# Patient Record
Sex: Female | Born: 1965 | Race: Black or African American | Hispanic: No | Marital: Single | Smoking: Never smoker
Health system: Southern US, Community
[De-identification: ages and names within clinical notes are randomized; demographics above are authoritative.]

## PROBLEM LIST (undated history)

## (undated) DIAGNOSIS — I10 Essential (primary) hypertension: Secondary | ICD-10-CM

## (undated) DIAGNOSIS — E785 Hyperlipidemia, unspecified: Secondary | ICD-10-CM

## (undated) DIAGNOSIS — E1129 Type 2 diabetes mellitus with other diabetic kidney complication: Secondary | ICD-10-CM

## (undated) DIAGNOSIS — E1165 Type 2 diabetes mellitus with hyperglycemia: Principal | ICD-10-CM

## (undated) HISTORY — DX: Essential (primary) hypertension: I10

## (undated) HISTORY — PX: TONSILLECTOMY: SUR1361

## (undated) HISTORY — DX: Morbid (severe) obesity due to excess calories: E66.01

## (undated) HISTORY — DX: Type 2 diabetes mellitus with other diabetic kidney complication: E11.29

## (undated) HISTORY — DX: Hyperlipidemia, unspecified: E78.5

## (undated) HISTORY — PX: ABDOMINAL HYSTERECTOMY: SHX81

## (undated) HISTORY — DX: Type 2 diabetes mellitus with hyperglycemia: E11.65

---

## 1999-01-28 ENCOUNTER — Other Ambulatory Visit: Admission: RE | Admit: 1999-01-28 | Discharge: 1999-01-28 | Payer: Self-pay | Admitting: Obstetrics and Gynecology

## 2000-03-23 ENCOUNTER — Emergency Department (HOSPITAL_COMMUNITY): Admission: EM | Admit: 2000-03-23 | Discharge: 2000-03-24 | Payer: Self-pay | Admitting: Emergency Medicine

## 2000-04-06 ENCOUNTER — Emergency Department (HOSPITAL_COMMUNITY): Admission: EM | Admit: 2000-04-06 | Discharge: 2000-04-06 | Payer: Self-pay | Admitting: Emergency Medicine

## 2000-04-06 ENCOUNTER — Encounter: Payer: Self-pay | Admitting: Emergency Medicine

## 2001-10-10 ENCOUNTER — Emergency Department (HOSPITAL_COMMUNITY): Admission: EM | Admit: 2001-10-10 | Discharge: 2001-10-10 | Payer: Self-pay | Admitting: Emergency Medicine

## 2002-10-16 ENCOUNTER — Encounter: Payer: Self-pay | Admitting: Emergency Medicine

## 2002-10-16 ENCOUNTER — Emergency Department (HOSPITAL_COMMUNITY): Admission: EM | Admit: 2002-10-16 | Discharge: 2002-10-16 | Payer: Self-pay | Admitting: Emergency Medicine

## 2003-12-28 ENCOUNTER — Encounter: Admission: RE | Admit: 2003-12-28 | Discharge: 2003-12-28 | Payer: Self-pay | Admitting: Internal Medicine

## 2004-01-12 ENCOUNTER — Ambulatory Visit (HOSPITAL_COMMUNITY): Admission: RE | Admit: 2004-01-12 | Discharge: 2004-01-12 | Payer: Self-pay | Admitting: General Surgery

## 2004-01-12 ENCOUNTER — Encounter (INDEPENDENT_AMBULATORY_CARE_PROVIDER_SITE_OTHER): Payer: Self-pay | Admitting: *Deleted

## 2007-04-08 ENCOUNTER — Encounter: Admission: RE | Admit: 2007-04-08 | Discharge: 2007-04-08 | Payer: Self-pay | Admitting: Internal Medicine

## 2007-04-20 ENCOUNTER — Encounter: Admission: RE | Admit: 2007-04-20 | Discharge: 2007-04-20 | Payer: Self-pay | Admitting: Internal Medicine

## 2007-04-24 ENCOUNTER — Encounter: Admission: RE | Admit: 2007-04-24 | Discharge: 2007-04-24 | Payer: Self-pay | Admitting: Internal Medicine

## 2007-11-04 ENCOUNTER — Encounter (INDEPENDENT_AMBULATORY_CARE_PROVIDER_SITE_OTHER): Payer: Self-pay | Admitting: Obstetrics and Gynecology

## 2007-11-04 ENCOUNTER — Inpatient Hospital Stay (HOSPITAL_COMMUNITY): Admission: RE | Admit: 2007-11-04 | Discharge: 2007-11-07 | Payer: Self-pay | Admitting: Obstetrics and Gynecology

## 2007-11-15 ENCOUNTER — Inpatient Hospital Stay (HOSPITAL_COMMUNITY): Admission: AD | Admit: 2007-11-15 | Discharge: 2007-11-15 | Payer: Self-pay | Admitting: Obstetrics and Gynecology

## 2007-11-15 ENCOUNTER — Inpatient Hospital Stay (HOSPITAL_COMMUNITY): Admission: AD | Admit: 2007-11-15 | Discharge: 2007-11-16 | Payer: Self-pay | Admitting: Obstetrics and Gynecology

## 2007-11-19 ENCOUNTER — Ambulatory Visit (HOSPITAL_COMMUNITY): Admission: RE | Admit: 2007-11-19 | Discharge: 2007-11-19 | Payer: Self-pay | Admitting: Obstetrics and Gynecology

## 2007-11-28 ENCOUNTER — Inpatient Hospital Stay (HOSPITAL_COMMUNITY): Admission: AD | Admit: 2007-11-28 | Discharge: 2007-11-28 | Payer: Self-pay | Admitting: Obstetrics and Gynecology

## 2007-11-29 ENCOUNTER — Inpatient Hospital Stay (HOSPITAL_COMMUNITY): Admission: AD | Admit: 2007-11-29 | Discharge: 2007-11-29 | Payer: Self-pay | Admitting: Obstetrics and Gynecology

## 2007-12-04 ENCOUNTER — Inpatient Hospital Stay (HOSPITAL_COMMUNITY): Admission: AD | Admit: 2007-12-04 | Discharge: 2007-12-04 | Payer: Self-pay | Admitting: Obstetrics and Gynecology

## 2008-12-26 ENCOUNTER — Encounter: Admission: RE | Admit: 2008-12-26 | Discharge: 2008-12-26 | Payer: Self-pay | Admitting: Cardiology

## 2008-12-27 ENCOUNTER — Inpatient Hospital Stay (HOSPITAL_BASED_OUTPATIENT_CLINIC_OR_DEPARTMENT_OTHER): Admission: RE | Admit: 2008-12-27 | Discharge: 2008-12-27 | Payer: Self-pay | Admitting: Cardiology

## 2010-09-09 LAB — POCT I-STAT GLUCOSE: Operator id: 194801

## 2010-10-16 NOTE — H&P (Signed)
[name], Stokes               ACCOUNT NO.:  [id number]   MEDICAL RECORD NO.:  [id number]         PATIENT TYPE:  CARD   LOCATION:                                 FACILITY:   PHYSICIAN:  Colleen Can. Deborah Chalk, M.D.DATE OF BIRTH:  1966-03-09   DATE OF ADMISSION:  12/27/2008  DATE OF DISCHARGE:                              HISTORY & PHYSICAL   CHIEF COMPLAINT:  Chest pain.   HISTORY OF PRESENT ILLNESS:  Jenna Stokes is a 45 year old morbidly obese  black female who presents for cardiac catheterization.  She was referred  to our office for stress testing due to recurrent episodes of chest  pain.  She has multiple cardiovascular risk factors.  She underwent  stress testing on December 16, 2008.  She was able to exercise on the  standard Bruce protocol for a total of 3-1/2 minutes.  She had very poor  exercise tolerance.  She was subsequently switched over to a Lexiscan  protocol.  Her findings were consistent with inferior ischemia, although  her morbid obesity limits the sensitivity and specificity of the test.  She is now referred for cardiac catheterization.  Clinically, she has  had complaints of chest pain with exertion.   PAST MEDICAL HISTORY:  1. Diabetes since June 2009.  2. Morbid obesity.  3. Hypertension.  4. History of hysterectomy in June 2009, with subsequent wound      dehiscence.  5. Remote tonsillectomy.  6. Childbirth x1.  7. Hyperlipidemia.  8. Positive family history for coronary disease.   ALLERGIES:  PENICILLIN.   CURRENT MEDICATIONS:  1. Benicar/hydrochlorothiazide 40/25 daily.  2. Metformin 500 mg a day.  3. Multivitamin daily.   FAMILY HISTORY:  Her father died at age 66 of unknown reasons.  Her  mother is alive at age 3 and is currently on dialysis.  She has had  previous heart attack and had stent placement x2.   SOCIAL HISTORY:  She is single.  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  She does not smoke and has no alcohol use.  She really does not  exercise  on a routine basis.   REVIEW OF SYSTEMS:  She has had episodes of chest pain with exertion.  She is short of breath with walking.  She has really had no complaints  of edema.  She notes that she was very short of breath about a year ago,  but that has now improved.  She is currently at her highest weight and  has been morbidly obese for quite some time.  She has had no recent  fever, flu or cough.  She is not lightheaded or dizzy.  She has had no  abdominal pain, constipation or diarrhea.  All other review of systems  are negative.   PHYSICAL EXAMINATION:  GENERAL:  She is a morbidly obese black female.  She is pleasant.  She is in no acute distress.  VITAL SIGNS:  Her weight is 347.8 pounds and she is 5 feet 9.  Her blood  pressure is 150/96 sitting, 150/90 standing, heart rate is 80 and  regular,  respirations 18.  She is afebrile.  SKIN:  Warm and dry.  Color is unremarkable.  HEENT:  Normocephalic, atraumatic.  Pupils are equal and reactive.  Sclerae is nonicteric.  NECK:  Supple, thick and without masses.  LUNGS:  Show decreased breath sounds bilaterally.  CARDIAC:  Shows the heart tones to be distant.  ABDOMEN:  Morbidly obese.  EXTREMITIES:  Full, but no significant edema.  MUSCULOSKELETAL:  Shows gait and range of motion to be intact.  Strength  is symmetric.  NEUROLOGIC:  Shows no gross focal deficits.   Pertinent labs are pending.   OVERALL IMPRESSION:  1. Abnormal stress test.  2. Chest pain.  3. Hypertension.  4. Diabetes.  5. Morbid obesity.   PLAN:  We will proceed on with cardiac catheterization.  The risks of  the procedure and benefits have all been explained and she is willing to  proceed on Tuesday, December 27, 2008.      Jenna Stokes, N.P.      Colleen Can. Deborah Chalk, M.D.  Electronically Signed    LC/MEDQ  D:  12/26/2008  T:  12/26/2008  Job:  [id number]

## 2010-10-16 NOTE — Cardiovascular Report (Signed)
NAMEMEMORI, SAMMON               ACCOUNT NO.:  [id number]   MEDICAL RECORD NO.:  [id number]          PATIENT TYPE:  OIB   LOCATION:  1965                         FACILITY:  [facility]   PHYSICIAN:  Colleen Can. Deborah Chalk, M.D.DATE OF BIRTH:  09-Sep-1965   DATE OF PROCEDURE:  DATE OF DISCHARGE:  12/27/2008                            CARDIAC CATHETERIZATION   Ms. Fittro presents with atypical chest pain, but had an abnormal stress  Cardiolite study.  She is morbidly obese.   She does have diabetes and hypertension, however.   TYPE AND SITE OF ENTRY:  Percutaneous right femoral artery.   CATHETERS:  A 4-French JL-4 left coronary cath, 3-DRC 4-French right  coronary catheter, 4-French pigtail ventriculographic catheter.   MEDICATIONS GIVEN PRIOR TO PROCEDURE:  Valium 10 mg p.o.   MEDICATIONS GIVEN DURING THE PROCEDURE:  Versed 5 mg IV, fentanyl 50 mcg  IV, and labetalol 40 mg IV.   CONTRAST MATERIAL:  Omnipaque.   COMMENTS:  The patient tolerated the procedure well.   HEMODYNAMIC DATA:  The aortic pressure is 193/93, LV pressure 194/60-29.   ANGIOGRAPHIC DATA:  The left ventricular angiogram was performed in the  RAO position.  Overall cardiac size and silhouette were normal.  The  global ejection fraction was 60%.  Regional wall motion was normal.   Coronary arteries:  The coronary arteries arise and distribute normally.  1. Left main coronary artery is normal.  2. Left anterior descending has a diagonal vessel and then the second      diagonal vessel.  It is the largest branch.  The left anterior      descending is normal.  3. Left circumflex has a more proximal obtuse marginal and then a      bifurcating distal obtuse marginal.  It is normal.  4. Right coronary artery is a small, but dominant vessel.  It is      normal.   OVERALL IMPRESSION:  1. Normal left ventricular function.  2. Normal coronary arteries.  3. Hypertension with hypertensive heart disease.   PLAN:   Management of Ms. Granados will need to be with weight reduction  and management of hypertension and diabetes.      Colleen Can. Deborah Chalk, M.D.  Electronically Signed     SNT/MEDQ  D:  12/27/2008  T:  12/28/2008  Job:  [id number]   cc:   Juline Patch, M.D.

## 2010-10-16 NOTE — Op Note (Signed)
NAMEBRYCE, Jenna Stokes               ACCOUNT NO.:  [id number]   MEDICAL RECORD NO.:  [id number]          PATIENT TYPE:  AMB   LOCATION:  [facility]                           FACILITY:  [facility]   PHYSICIAN:  Dois Davenport A. Rivard, M.D. DATE OF BIRTH:  1966/02/23   DATE OF PROCEDURE:  DATE OF DISCHARGE:                               OPERATIVE REPORT   PREOPERATIVE DIAGNOSIS:  Symptomatic uterine fibroids with menorrhagia  and anemia as well as morbid obesity.   POSTOPERATIVE DIAGNOSIS:  Symptomatic uterine fibroids with menorrhagia  and anemia as well as morbid obesity, new diagnosis of type 2 diabetes.   ANESTHESIA:  General, Raul Del, M.D.   PROCEDURE:  Total abdominal hysterectomy.   SURGEON:  Crist Fat. Rivard, M.D.   ASSISTANTMarquis Lunch. Adline Peals., physician's assistant.   ESTIMATED BLOOD LOSS:  300 mL.   PROCEDURE:  After being informed of the planned procedure with possible  complications including bleeding, infection, injury to bowels, bladder  or ureters, informed consent is obtained.  The patient is taken to OR  #3, given general anesthesia with endotracheal intubation without any  complication.  She is placed in the dorsal decubitus position with knee-  high sequential compression devices and arms onto support boards.  Her  abdomen is taped to a retraction device to help move the pannus up.  She  is then prepped and draped in a sterile fashion and a Foley catheter is  inserted in her bladder.  We proceeded with a midline incision after  infiltrating this area with 20 mL of Marcaine 0.25 and we bring this  incision down sharply to the fascia.  The fascia is incised in the  midline fascia and linea alba is dissected.  Peritoneum is entered  bluntly.  Bookwalter retractors are then placed strategically to hold  the weight of the abdominal wall and to retract on each side.  Bowels  are then packed with moist lap.  Observation:  The uterus is greatly  enlarged about 20 weeks in  size, globulus with no abdominal fibroid  identified.  Both ovaries and both tubes are normal.  Omentum is normal.  Cul-de-sac is normal.  Both the anterior and posterior cul-de-sac are  normal.  The uterus was then delivered through the incision and a  corkscrew, uterine handle was placed at the fundus.   The right round ligament is then grasped, sutured with a transfix suture  of 0-Vicryl, sectioned, and tagged for future use.  The utero-ovarian  ligament is then isolated with the Rogers forceps, sectioned, and  sutured with a transfix suture of 0-Vicryl.  The broad ligament is  entered somewhat, but is adherent on the side and we decided to proceed  with the left side and have different approach. The left round ligament  is grasped, sutured, with a transfix suture of 0-Vicryl, held for future  use with a tag.  The left utero-ovarian ligament with tube are grasped  with Roger forceps, sectioned, and sutured with a transfix suture of 0-  Vicryl.  The broad ligament is then easily separated in its anterior  and  posterior sheath and we can open the round, broad ligament anteriorly  all the way to the right side in order to safely retract bladder  bluntly.  The posterior leaf of the broad ligament on that side was then  sectioned down to isolate uterine vessels.  Those are then clamped with  a Rogers clamp sectioned and sutured with a 0-Vicryl.  We then double  sutured this uterine artery with another 0-Vicryl.  We proceeded on the  right side with now opening the posterior sheath of the broad ligament  skeletonizing the uterine vessels and isolating them with Rogers  forceps, sectioning them and suturing them with this 0-Vicryl suture.  A  second suture is placed safely anteriorly of 0-Vicryl.   The cardinal ligament on each side is then clamped with a straight  Rogers sectioned and sutured with a transfix suture of 0-Vicryl and this  was done in the same fashion on the left side, we are  now able to do a  second bite on the cardinal ligament with a straight Rogers clamp,  sectioned that pedicle and sutured it with a transfix suture of 0-  Vicryl.  Because of the depth of the patient, we then amputated the body  of the uterus from the cervical stump and removed it completely, now we  have a better view and a better access to the uterosacral ligaments,  which are isolated with curved Rogers forceps, sectioned, and sutured  with a transfix suture held for future suspension.  Bladder is well away  from our vaginal fornix and we can now isolate the vaginal angles on  each side with the curved Rogers clamp.  We section those vaginal angles  and suture them with 0-Vicryl held for suspension.  The vagina was then  entered with a Satinsky scissors and the cervical stump is removed  entirely.  Vaginal angle sutures are then placed on each side with 0-  Vicryl and sutured to the corresponding uterosacral ligaments for  suspension.  The vault is then closed with figure-of-eight stitches of 0-  Vicryl.   On reviewing all pedicles, bleeding at the right utero-ovarian ligament  is noted and this ligament is grasped and then sutured with a 0-Vicryl.  Hemostasis on the vaginal vault appears satisfactory.  We then irrigated  profusely with warm saline and notice satisfactory hemostasis on all  pedicles.  The ureters are re-verified, are away from our suture line,  show normal peristalsis and bilaterally and have no dilatation.  We  again irrigated profusely with warm saline and noticed satisfactory  hemostasis.  Because we had to partially dissected the left pelvic wall  to visualize the ureter, a small piece of Gelfoam is left in place.  All  the packing and retractors were then removed.   Under fascia, hemostasis is completed with cautery and the fascia is  closed with Smead  Jones sutures of 0-Vicryl.  The wound is irrigated  with warm saline.  Hemostasis is completed with cautery.   The adipose  tissue is reapproximated with simple sutures of 0-plain and the skin is  closed with staples and 1/4 Steri-Strips.  Before we do that, a JP drain  is left in the incision suprafascially through a right counter incision  held in place with a 0-silk suture.   Instruments and sponge count is complete x2.  Estimated blood loss is  300 mL.  The procedure was very well tolerated by the patient who is  taken to  recovery room in a well and stable condition.   SPECIMEN:  Uterus and cervix sent to pathology, weighing 1606 grams.      Crist Fat Rivard, M.D.  Electronically Signed     SAR/MEDQ  D:  11/04/2007  T:  11/05/2007  Job:  [id number]   cc:   Merlene Laughter. Renae Gloss, M.D.  Fax: [phone]

## 2010-10-16 NOTE — H&P (Signed)
NAMETOPANGA, ALVELO               ACCOUNT NO.:  [id number]   MEDICAL RECORD NO.:  [id number]          PATIENT TYPE:  AMB   LOCATION:                                FACILITY:  [facility]   PHYSICIAN:  Crist Fat. Rivard, M.D. DATE OF BIRTH:  29-Jun-1965   DATE OF ADMISSION:  11/19/2007  DATE OF DISCHARGE:                              HISTORY & PHYSICAL   Ms. Heaphy is a 45 year old single African American female para 1-0-2-1  who presents today on the day of admission for closure of abdominal  incision secondary to wound dehiscence.  The patient had a total  abdominal hysterectomy which was performed by Dr. Estanislado Pandy on June 3.  She  subsequently following discharge and postop course at home represented  first on June 14 at maternity admission unit at [facility] with a  complaint of some increase in drainage and sensation of feeling like the  wound was starting to open up.  She then represented later on toward the  end of the day on the 14th and 15th,  and after going home had felt as  she sat down at home the dehiscence had occurred with further extension.  She has been followed in the office daily, where she has been having  wound dressing changes.  Both Iodoform has been used as well as just  saline wet-to-dry gauze dressings to pack the wound.  She has been  encouraged at every visit to comply with wearing an abdominal binder to  give more support to the area, and after having it placed on yesterday  in the office continued to remove it and it was in her bag before she  left.  She presents today in the hospital saying that she is not sure  where it is, it  could be in her car, and continues to be noncompliant  with that instruction.  The patient was recently diagnosed with type 2  adult onset diabetes, which was diagnosed on June 3 on the date of her  surgery for elevated fasting blood sugar.  Other history:  She was  scheduled for the total abdominal hysterectomy because of symptomatic  uterine fibroids and menorrhagia.  The patient had reported since  menarche at age 82 having heavy periods.  Over the past 5 years,  however, her symptoms have worsened in regards to menstrual flow.  She  reports flow between 5 to 7 days, during which time she changes between  overnight pads and super-plus tampons approximately 8 times a day and in  spite of that still has soiling of clothes.  Further reported menstrual  cramps an 8/10 but did have some relief with Extra Strength Tylenol.  She had denied any intermenstrual bleeding, dyspareunia, vaginitis,  fever, nausea, vomiting, diarrhea or dysuria.  Complained of occasional  nocturia.  Did have a hemoglobin of 7.2 in November 2008 as a result of  heavy bleeding, however, following daily iron therapy increased to 13.7  by  February 2009.  Pelvic ultrasound November 2008 showed uterus  measuring 22.13 cm x 15.77 cm x 12.7 cm and a midline fibroid  measuring  15.3 x 11.7 x 14 cm.  Both her ovaries were within normal limits on that  study.  The patient was placed on Lupron and Depo 11.25 mg in December  2008.  Followup ultrasound in May 2009 showed a uterus measuring 9.14 x  10.2 cm with a fibroid measuring 16.2 x 11.0 cm.  The patient's ovaries  were again normal on that study.  Most recently the patient did have  labs drawn June 17, yesterday.  White count was slightly elevated at  13.3, hemoglobin was 12.8, hematocrit 38.1, platelets were 353.  Her  potassium was slightly low, equal to 3, and had an elevated glucose  equal to 118.  Today she presents with denying any cough, shortness of  breath, sore throat, ear pain or headache, without nausea, vomiting or  diarrhea.  No UTI signs or symptoms.  She denies any extremity edema.  She did say that she had a change of dressing on her wound this morning  after it was packed and dressed yesterday afternoon at the office by  Karin Lieu, Certified Nurse Midwife/FNP.  She is without any other   complaints.  Did have concerns and voiced question if she needed to have  Tamiflu in light of recent H1N1 confirmation at [facility].  She also did have questions which I had deferred to anesthesia and Dr.  Estanislado Pandy regarding small turnaround to go back under general anesthesia.  The patient is having little pain.  She reports that the area of  incision is still very numb, occasionally a tingling sensation, but has  not required any recent pain medications.   OBSTETRICAL HISTORY:  The patient is a gravida 3, para 1-0-2-1.  She did  have one C-section.   GYNECOLOGIC HISTORY:  Menarche at 45 years of age.  Last menstrual  period was December 2008 secondary to Lupron administration.  Denies any  history of sexually transmitted diseases or abnormal Pap smear.  Last  normal Pap smear was October 2008.   MEDICAL HISTORY:  1. Recent diagnosis of type 2 diabetes on November 04, 2007.  2. Hypertension.  3. History of anemia.  4. Migraines.  5. Arm fracture.  6. Morbid obesity.   SURGICAL HISTORY:  Includes tonsillectomy, total abdominal hysterectomy,  left breast lipoma excision.  Denies any history of blood transfusions.   FAMILY HISTORY:  Significant for hypertension, diabetes and migraines.   HABITS:  The patient does not use tobacco.  Consumes alcohol on special  occasions.  Denies illicit drug use.   CURRENT MEDICATIONS:  Benicar/HCT 40/25 one tablet p.o. daily as well as  metformin 500 mg p.o. daily.   ALLERGIES:  SHE HAS A PENICILLIN ALLERGY, WHICH CAUSES HIVES.   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.   REVIEW OF SYSTEMS:  Reports occasional nocturia twice a night,  occasional constipation.  Please see history of present illness for any  other complaints but review of systems overall negative.   PHYSICAL EXAMINATION:  VITAL SIGNS:  The patient's most recent weight  336 pounds, height is 5 feet 10 1/2  inches.  BMI is somewhere around 49.  GENERAL:  No acute distress.  She is alert and oriented x3, slightly  nervous.  NECK:  Supple without masses.  No thyromegaly or cervical adenopathy.  HEART:  Regular rate and rhythm.  LUNGS:  Clear.  BACK:  No CVA tenderness.  ABDOMEN:  Without tenderness,  masses or organomegaly.  She did have a  dressing on pannus of abdomen that had a completely clean  dressing.  There was no drainage noted.  No pain or tenderness on palpation.  Original length of vertical dehiscence began around 13 cm at last note  by Dr. Estanislado Pandy, as long as 22 cm now, which does not include underlying  fascia.  EXTREMITIES:  Without clubbing, cyanosis or edema.  PELVIC:  Exam deferred today.   IMPRESSION:  1. Wound dehiscence, status post total abdominal hysterectomy.  2. Recent type 2 diabetes diagnosis on June 3 as well.  3. Morbidly obese.  4. Slightly elevated white count equal to 13.3 with probable      relationship to healing process and dehiscence itself.   PLAN:  Discussion with Dr. Estanislado Pandy of risks, benefits and alternatives of  closing wound dehiscence versus continued packing, and the patient  desires to proceed with return to the OR to attempt to close dehiscence  of vertical incision.  Questions were deferred to anesthesia and Dr.  Estanislado Pandy prior to surgery, and the patient is currently being prepped for  the procedure.      Candice Earling, CNM      Dois Davenport A. Rivard, M.D.  Electronically Signed    CHS/MEDQ  D:  11/19/2007  T:  11/19/2007  Job:  [id number]

## 2010-10-16 NOTE — H&P (Signed)
NAMEDESSA, LEDEE               ACCOUNT NO.:  [id number]   MEDICAL RECORD NO.:  [id number]          PATIENT TYPE:  AMB   LOCATION:  SDC                           [facility]ILITY:  [facility]   PHYSICIAN:  Dois Davenport A. Rivard, M.D. DATE OF BIRTH:  11-07-1965   DATE OF ADMISSION:  DATE OF DISCHARGE:                              HISTORY & PHYSICAL   Ms. Safley is a 45 year old single African-American female, para 1-0-2-1  who presents for total abdominal hysterectomy because of symptomatic  uterine fibroids and menorrhagia.  The patient reports since menarche at  age 40, she has had heavy periods.  Over the past 5 years, however,  her symptoms have worsened in regards to her menstrual flow.  The  patient will flow between 5-7 days during which time she changes in  overnight pads with a Super Plus tampon approximately 8 times daily.  And in spite of this, will occasionally soil her clothes.  She further  reports having menstrual cramps which she rates as 8/10 on 10-point pain  scale, however, will get moderate relief (4/10 on a 10-point pain scale)  with Extra Strength Tylenol.  She denies any intermenstrual bleeding,  dyspareunia, vaginitis symptoms, fever, nausea, vomiting, diarrhea, or  dysuria.  She does admit, however, to some occasional nocturia.  In  November 2008, the patient had a hemoglobin of 7.2 as a result of her  heavy bleeding, however, with daily iron therapy it increased to 13.7 by  February 2009.  Pelvic ultrasound in November 2008 showed a uterus  measuring 22.13 cm x 15.77 cm x 12.7 cm and a midline fibroid measuring  15.3 x 11.7 x 14 cm.  Both ovaries appeared normal on this study.  The  patient was placed on Lupron and Depo 11.25 mg in December 2008.  A  followup ultrasound in May 2009 showed a uterus measuring 9.14 x 10.2 cm  with a fibroid measuring 16.2 x 11.0 cm.  Again, the patient's ovaries  appeared normal on this study.  The patient had extensive review of both  medical  and surgical management options for her symptoms to include  expected management, hormonal therapy, uterine artery embolization,  myomectomy, and hysterectomy.  After careful consideration, however, the  patient has concluded that she wishes to proceed with definitive therapy  in the form of hysterectomy.   OB HISTORY:  Gravida 3, para 1-0-2-1.  The patient underwent cesarean  section.   GYN HISTORY:  Menarche 45 years old.  Last menstrual period was December  2008 due to being on Lupron.  She denies any history of sexually  transmitted diseases or abnormal Pap smears.  Her last normal Pap smear  was October 2008.   MEDICAL HISTORY:  1. Hypertension.  2. Anemia.  3. Migraines.  4. Arm fracture.  5. Morbid obesity   SURGICAL HISTORY:  The patient has had a tonsillectomy and a left breast  lipoma excision.  Denies any history of blood transfusion.   FAMILY HISTORY:  Hypertension, diabetes, and migraines.   HABITS:  The patient does not use tobacco.  She consumes alcohol only on  special occasions.  Denies any illicit drug use.   CURRENT MEDICATIONS:  1. Azor 10/20 one tablet daily.  2. Hydrochlorothiazide 25 mg daily.   ALLERGIES:  The patient is allergic to PENICILLIN which causes hives.   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   REVIEW OF SYSTEMS:  The patient occasionally has nocturia (twice  nightly), occasional constipation, but denies any chest pain, shortness  of breath, headache, fever, dizziness, nausea, vomiting, diarrhea, and  except as is mentioned in history of present illness.  The patient's  review of systems is negative.   PHYSICAL EXAMINATION:  VITAL SIGNS:  Blood pressure 130/82, weight is  346 pounds, height is 5 feet 10-1/2 inches tall,BMI 49.7, pulse is 78.  NECK:  Supple without masses.  There is no thyromegaly or cervical  adenopathy.  HEART:  Regular rate and rhythm.  LUNGS:   Clear.  BACK:  No CVA tenderness.  ABDOMEN:  Without tenderness, masses, or organomegaly.  EXTREMITIES:  Without clubbing, cyanosis, or edema.  PELVIC:  EG/BUS is within normal limits.  Vagina is rugous.  Cervix is  nontender without lesions.  Uterus appears approximately 14-16 weeks  size, however, exam is severely limited due to body habitus.  There is  no tenderness.  Adnexa without tenderness or masses.   IMPRESSIONS:  1. Symptomatic uterine fibroids.  2. Menorrhagia.  3. History of anemia (hemoglobin as low as 7.2).  4. Morbid obesity.   DISPOSITION:  A discussion was held with the patient regarding  indications for her procedure along with its risks which include but are  not limited to reaction to anesthesia, damage to adjacent organs,  infection, and excessive bleeding.  The patient has consented to proceed  with a total abdominal hysterectomy at [facility] [place] on  November 04, 2007 at 7:30 a.m.      Elmira J. Adline Peals.      Crist Fat Rivard, M.D.  Electronically Signed    EJP/MEDQ  D:  11/01/2007  T:  11/02/2007  Job:  [id number]

## 2010-10-16 NOTE — Op Note (Signed)
Jenna Stokes, Jenna Stokes               ACCOUNT NO.:  [id number]   MEDICAL RECORD NO.:  [id number]          PATIENT TYPE:  AMB   LOCATION:  SDC                           [facility]   PHYSICIAN:  Dois Davenport A. Rivard, M.D. DATE OF BIRTH:  05/18/1966   DATE OF PROCEDURE:  11/19/2007  DATE OF DISCHARGE:                               OPERATIVE REPORT   PREOPERATIVE DIAGNOSIS:  Morbid obesity status post abdominal  hysterectomy, wound dehiscence.   POSTOPERATIVE DIAGNOSIS:  Morbid obesity status post abdominal  hysterectomy, wound dehiscence.   ANESTHESIA:  General by Dr. Tacy Dura.   PROCEDURE:  Secondary closure of wound with placement of retention  sutures.   SURGEON:  Crist Fat. Rivard, MD   ASSISTANT:  Museum/gallery exhibitions officer, CNM   ESTIMATED BLOOD LOSS:  Minimal.   PROCEDURE IN DETAIL:  After being informed of the planned procedure with  possible complications including bleeding and infection as well as  recurrent wound dehiscence, informed consent was obtained.  The patient  was taken to OR #3 and given general anesthesia with laryngeal mask  without complication.  She was placed in the dorsal decubitus position,  already lying on an abdominal binder fitted for her size.  She was  prepped and draped in a sterile fashion and the packing previously  placed in the incision was removed during that process.  She is now  draped in a sterile fashion.   The incision was opened on a length of 15 cm with clean edges.  No  evidence of infection and the fatty tissue underneath the incision was  about 18 cm deep, also healthy with good granulation tissue.  The  incision was profusely irrigated with normal saline and Betadine.  The  edges of the skin are removed sharply with knife.  A counter incision  was placed on the right and a #10 JP drain was deposited in the incision  after assessing the fascia which was completely intact.  This drain was  sutured to the skin with a 0 silk.  We then  placed multiple layers of 0  chromic in the fatty tissue after removing some of the tissue until we  have bleeding to denote fresh tissue.  Hemostasis was completed with  cautery.  Retention sutures using Ethilon 0 are placed.  Two retention  sutures are placed under direct visualization.  The wound was then  closed in multiple layers until the skin with 0 chromic large sutures.  During this process, we repetitively irrigated the incision and this was  done with warm saline.  The skin was then closed with a subcuticular  suture of 2-0 Monocryl and strategically placed staples.  We then tied  the retention suture.  Benzoin was applied and half-inch full length  Steri-Strips are also placed.  The patient is undraped, washed, and  dried.  Her abdominal pannus was pushed upward and the abdominal binder  was closed over the incision after we placed fluffs and abdominal pad.   Instrument and sponge count was complete x2.  Estimated blood loss was  minimal.  The procedure was well  tolerated by the patient who was taken  to recovery room in a well and stable condition.      Crist Fat Rivard, M.D.  Electronically Signed     SAR/MEDQ  D:  11/19/2007  T:  11/20/2007  Job:  [id number]

## 2010-10-19 NOTE — Discharge Summary (Signed)
NAMEJAN, Jenna Stokes               ACCOUNT NO.:  [id number]   MEDICAL RECORD NO.:  [id number]          PATIENT TYPE:  MAT   LOCATION:  MATC                          [facility]   PHYSICIAN:  Crist Fat. Rivard, M.D. DATE OF BIRTH:  September 12, 1965   DATE OF ADMISSION:  11/04/2007  DATE OF DISCHARGE:  11/07/2007                               DISCHARGE SUMMARY   DISCHARGE DIAGNOSES:  1. Symptomatic uterine fibroids.  2. Menorrhagia.  3. History of anemia (hemoglobin as low as 7.2).  4. Morbid obesity.  5. Non-insulin-dependent diabetes.   OPERATION:  On the date of admission, the patient underwent a total  abdominal hysterectomy with placement of a JP drain, tolerating  procedure well.  The patient was found have a multiple fibroid uterus  and cervix with an aggregate weight of 1606.8 grams.   HISTORY OF PRESENT ILLNESS:  Jenna Stokes is a 45 year old single African  American female para 1-0-2-1 who presents for total abdominal  hysterectomy because of symptomatic uterine fibroids and menorrhagia.  Please see the patient's dictated history and physical examination for  details.  Preoperative physical exam, blood pressure 130/82, weight 346  pounds, height 5 feet 10-1/2 inches tall, body mass index 49.7, pulse  78.  General exam was within normal limits.  Pelvic exam, EGD was within  normal limits.  Vagina was rugose.  Cervix was nontender without  lesions.  Uterus appeared approximately 14-16 weeks' size; however, exam  was severely limited due to body habitus.  There was no tenderness and  adnexa was without tenderness or masses.   HOSPITAL COURSE:  On the date of admission, the patient underwent  aforementioned procedure tolerating it well.  It was found to the time  of the patient's preop labs that she had an elevated blood sugar at 133  and subsequently was placed on oral diabetes medication.  The patient's  capillary blood glucose during her hospital stay ranged for 104-221.  The  patient's hemoglobin A1c was found to be 6.5.  Remainder of  postoperative course was unremarkable with the patient's postop  hemoglobin being 11.70 (preop hemoglobin 13.0).  By postop day #2, the  patient had resumed bowel and bladder function.  However, she had need  of training and monitoring of her blood sugar and therefore was not  discharged until postop day #3.   DISCHARGE MEDICATIONS:  The patient was directed to her home medication  reconciliation form.  She was also advised to take Colace 100 mg 1-2  tablets daily until her bowel movements were regular.  Glyburide 5 mg 1  tablet before breakfast daily.  Percocet 5/325 1-2 tablets every 4 hours  as needed for pain.  Ibuprofen 600 mg with food every 6 hours for 5 days  then as needed for pain   FOLLOWUP:  The patient is scheduled for a staple removal at [facility] on November 11, 2007 at 2:30 p.m.  She is scheduled for  postoperative visit with Dr. Estanislado Pandy on December 16, 2007 at 10:15 a.m.  The  patient was also directed to call Dr. Ricki Miller for  an appointment within the  next 10 days in order to follow up her new diagnosis of diabetes.   DISCHARGE INSTRUCTIONS:  The patient was given a copy of [facility] postoperative instruction sheet.  She was further  advised to avoid driving for 2 weeks, heavy lifting for 4 weeks,  intercourse for 6 weeks, that she may shower, that she may walk up  steps, wound care.  She is to keep her wound clean and dry.   DIET:  The patient was urged to follow a low-sodium and modified  carbohydrate diet.   FINAL PATHOLOGY:  Uterus and cervix:  Cervix - unremarkable; endometrium  - proliferative, no evidence of hyperplasia or malignancy and  leiomyomata.      Elmira J. Adline Peals.      Crist Fat Rivard, M.D.  Electronically Signed    EJP/MEDQ  D:  12/08/2007  T:  12/09/2007  Job:  [id number]

## 2010-10-19 NOTE — Discharge Summary (Signed)
NAMEMAZZY, SANTARELLI               ACCOUNT NO.:  [id number]   MEDICAL RECORD NO.:  [id number]          PATIENT TYPE:  AMB   LOCATION:  SDC                           [facility]   PHYSICIAN:  Dois Davenport A. Rivard, M.D. DATE OF BIRTH:  December 14, 1965   DATE OF ADMISSION:  11/19/2007  DATE OF DISCHARGE:  11/19/2007                               DISCHARGE SUMMARY   DISCHARGE DIAGNOSES:  1. Wound dehiscence.  2. Morbid obesity.  3. Status post total abdominal hysterectomy (November 04, 2007).  4. Diabetes mellitus.   OPERATION:  On the date of admission, the patient underwent secondary  closure of a wound dehiscence with the replacement of retention sutures,  tolerating procedures well.   HISTORY OF PRESENT ILLNESS:  Ms. Lansdale is a 45 year old single African  American female para 1-0-2-1, who presents for closure of an abdominal  incision secondary to wound dehiscence.  The patient was status post  total abdominal hysterectomy on November 04, 2007, at which time, she was  diagnosed with diabetes mellitus.  The patient subsequently developed a  wound dehiscence on postop day #11 and desired closure due to the  magnitude of her wound separation.  Please see the patient's dictated  history and physical examination for details.   PREOP PHYSICAL EXAMINATION:  VITAL SIGNS:  Weight 336 pounds, height 5  feet 10-1/2 inches tall, body mass index 49.  GENERAL EXAM:  Within normal limits.  ABDOMINAL EXAM:  Without tenderness, masses, or organomegaly.  The  patient had a dressing on her pannus and no drainage was noted.  There  was no pain or tenderness to palpation.  The original length of the  vertical dehiscence began around 13 cm and at the time of surgery, had  extended to 22 cm.  The underlying fascia was not included and therefore  found to be intact.   HOSPITAL COURSE:  On the day of admission, the patient underwent  aforementioned procedure tolerating it well.   LABORATORY DATA:  At the time of  surgery, basic metabolic panel was  within normal limits with the exception of her potassium being low at  3.0, glucose elevated at 118.  Her CBC was within normal limits with the  exception of her white blood count being 13.3.  The patient remained  stable postoperatively and responded as expected in recovery and  therefore was discharged home.   DISCHARGE MEDICATIONS:  1. Benicar HCT 40/25 1 tablet daily.  2. Metformin 500 mg daily.  3. Ibuprofen 600 mg daily.  4. Percocet 5/325 1-2 tablets every 4 to 6 hours as needed for pain.  5. Clindamycin 450 mg every 6 hours for 5 days.   FOLLOW UP:  The patient is scheduled to see Henreitta Leber, [facility] at the  office of [facility] on November 23, 2007, at 10 a.m. for wound  evaluation.   DISCHARGE INSTRUCTIONS:  The patient was advised to wear an abdominal  binder 24 hours a day until her next appointment.  She was further  advised to avoid driving for 1 week, heavy lifting for 6 weeks,  intercourse for 6 weeks.  She also was directed to follow the  instructions given to her following her initial surgery.   DIET:  The patient's diet was as previously described low-sodium and  modified carbohydrate.      Elmira J. Adline Peals.      Crist Fat Rivard, M.D.  Electronically Signed    EJP/MEDQ  D:  12/08/2007  T:  12/09/2007  Job:  [id number]

## 2010-10-19 NOTE — Op Note (Signed)
NAME:  Jenna Stokes, Jenna Stokes                         ACCOUNT NO.:  [id number]   MEDICAL RECORD NO.:  [id number]                   PATIENT TYPE:  OIB   LOCATION:  2550                                 FACILITY:  [facility]   PHYSICIAN:  Rose Phi. Maple Hudson, M.D.                DATE OF BIRTH:  Aug 13, 1965   DATE OF PROCEDURE:  01/30/2004  DATE OF DISCHARGE:  01/12/2004                                 OPERATIVE REPORT   PREOPERATIVE DIAGNOSIS:  Intraductal papilloma of the left breast.   POSTOPERATIVE DIAGNOSIS:  Intraductal papilloma of the left breast.   OPERATION PERFORMED:  Excision of intraductal papilloma of the left breast.   SURGEON:  Rose Phi. Maple Hudson, M.D.   ANESTHESIA:  MAC.   DESCRIPTION OF PROCEDURE:  The patient was placed on the operating table  with the arm was extended on the arm board.  The left breast was draped in  the usual fashion.  Under local anesthesia, a circumareolar incision was  then made centered at the site of the left nipple discharge.  We then raised  an areolar flap and then excised the wedge of breast and duct system in this  area.  Hemostasis was obtained with a cautery.  Subcuticular closure with 4-  0 Monocryl and Steri-Strips carried out.  Dressing applied.  The patient was  then transferred to the recovery room in satisfactory condition having  tolerated the procedure well.                                               Rose Phi. Maple Hudson, M.D.    PRY/MEDQ  D:  01/30/2004  T:  01/30/2004  Job:  [id number]

## 2011-02-28 LAB — COMPREHENSIVE METABOLIC PANEL
AST: 26
Albumin: 3.9
BUN: 10
CO2: 28
Creatinine, Ser: 0.73
Glucose, Bld: 185 — ABNORMAL HIGH
Potassium: 3.3 — ABNORMAL LOW
Sodium: 139

## 2011-02-28 LAB — CBC
HCT: 38.5
Hemoglobin: 12.8
MCHC: 33.7
MCHC: 34.1
MCHC: 33.5
MCV: 89.3
Platelets: 213
Platelets: 353
RBC: 4.27
RDW: 12.9
WBC: 7.4
WBC: 13.3 — ABNORMAL HIGH

## 2011-02-28 LAB — BASIC METABOLIC PANEL
BUN: 10
CO2: 30
Calcium: 9.7
Chloride: 101
GFR calc Af Amer: >60
Glucose, Bld: 118 — ABNORMAL HIGH
Sodium: 138

## 2011-09-12 ENCOUNTER — Ambulatory Visit
Admission: RE | Admit: 2011-09-12 | Discharge: 2011-09-12 | Disposition: A | Discharge status: Home / Self Care | Payer: BC Managed Care – PPO | Source: Ambulatory Visit | Attending: Internal Medicine | Admitting: Internal Medicine

## 2011-09-12 ENCOUNTER — Other Ambulatory Visit: Payer: Self-pay | Admitting: Internal Medicine

## 2011-09-12 DIAGNOSIS — R52 Pain, unspecified: Secondary | ICD-10-CM

## 2011-09-12 DIAGNOSIS — R609 Edema, unspecified: Secondary | ICD-10-CM

## 2011-10-30 ENCOUNTER — Encounter: Payer: Self-pay | Admitting: *Deleted

## 2011-12-20 ENCOUNTER — Encounter: Payer: Self-pay | Admitting: Cardiology

## 2013-08-09 ENCOUNTER — Emergency Department (HOSPITAL_COMMUNITY): Payer: BC Managed Care – PPO

## 2013-08-09 ENCOUNTER — Encounter (HOSPITAL_COMMUNITY): Payer: Self-pay | Admitting: Emergency Medicine

## 2013-08-09 ENCOUNTER — Emergency Department (HOSPITAL_COMMUNITY)
Admission: EM | Admit: 2013-08-09 | Discharge: 2013-08-09 | Disposition: A | Discharge status: Home / Self Care | Payer: BC Managed Care – PPO | Attending: Emergency Medicine | Admitting: Emergency Medicine

## 2013-08-09 DIAGNOSIS — Z880 Allergy status to penicillin: Secondary | ICD-10-CM | POA: Insufficient documentation

## 2013-08-09 DIAGNOSIS — I517 Cardiomegaly: Secondary | ICD-10-CM

## 2013-08-09 DIAGNOSIS — Z79899 Other long term (current) drug therapy: Secondary | ICD-10-CM | POA: Insufficient documentation

## 2013-08-09 DIAGNOSIS — I10 Essential (primary) hypertension: Secondary | ICD-10-CM | POA: Insufficient documentation

## 2013-08-09 DIAGNOSIS — E119 Type 2 diabetes mellitus without complications: Secondary | ICD-10-CM | POA: Insufficient documentation

## 2013-08-09 DIAGNOSIS — J159 Unspecified bacterial pneumonia: Secondary | ICD-10-CM | POA: Insufficient documentation

## 2013-08-09 DIAGNOSIS — J189 Pneumonia, unspecified organism: Secondary | ICD-10-CM

## 2013-08-09 LAB — COMPREHENSIVE METABOLIC PANEL
ALBUMIN: 3.8 g/dL (ref 3.5–5.2)
ALT: 33 U/L (ref 0–35)
AST: 29 U/L (ref 0–37)
Alkaline Phosphatase: 60 U/L (ref 39–117)
BILIRUBIN TOTAL: 0.3 mg/dL (ref 0.3–1.2)
BUN: 14 mg/dL (ref 6–23)
CALCIUM: 10.1 mg/dL (ref 8.4–10.5)
CHLORIDE: 98 meq/L (ref 96–112)
CO2: 27 mEq/L (ref 19–32)
CREATININE: 0.86 mg/dL (ref 0.50–1.10)
GFR calc Af Amer: >90 mL/min (ref >90)
GFR calc non Af Amer: 79 mL/min — ABNORMAL LOW (ref >90)
GLUCOSE: 119 mg/dL — AB (ref 70–99)
Potassium: 3.2 mEq/L — ABNORMAL LOW (ref 3.7–5.3)
SODIUM: 141 meq/L (ref 137–147)
Total Protein: 7.4 g/dL (ref 6.0–8.3)

## 2013-08-09 LAB — CBC WITH DIFFERENTIAL/PLATELET
BASOS ABS: 0 10*3/uL (ref 0.0–0.1)
Basophils Relative: 1 % (ref 0–1)
EOS ABS: 0.3 10*3/uL (ref 0.0–0.7)
EOS PCT: 3 % (ref 0–5)
HEMATOCRIT: 38.9 % (ref 36.0–46.0)
HEMOGLOBIN: 13.6 g/dL (ref 12.0–15.0)
Lymphocytes Relative: 25 % (ref 12–46)
Lymphs Abs: 2.1 10*3/uL (ref 0.7–4.0)
MCH: 30.8 pg (ref 26.0–34.0)
MCHC: 35 g/dL (ref 30.0–36.0)
MCV: 88 fL (ref 78.0–100.0)
MONO ABS: 0.7 10*3/uL (ref 0.1–1.0)
MONOS PCT: 8 % (ref 3–12)
NEUTROS ABS: 5.3 10*3/uL (ref 1.7–7.7)
NEUTROS PCT: 63 % (ref 43–77)
PLATELETS: 198 10*3/uL (ref 150–400)
RBC: 4.42 MIL/uL (ref 3.87–5.11)
RDW: 12.5 % (ref 11.5–15.5)
WBC: 8.5 10*3/uL (ref 4.0–10.5)

## 2013-08-09 LAB — TROPONIN I
Troponin I: <0.3 ng/mL (ref <0.30)
Troponin I: <0.3 ng/mL (ref <0.30)

## 2013-08-09 LAB — D-DIMER, QUANTITATIVE: D-Dimer, Quant: 0.34 ug/mL-FEU (ref 0.00–0.48)

## 2013-08-09 MED ORDER — LEVOFLOXACIN IN D5W 500 MG/100ML IV SOLN
500.0000 mg | Freq: Once | INTRAVENOUS | Status: AC
  Administered 2013-08-09: 500 mg via INTRAVENOUS
  Filled 2013-08-09: qty 100

## 2013-08-09 MED ORDER — LEVOFLOXACIN 750 MG PO TABS: 750.0000 mg | ORAL_TABLET | Freq: Every day | ORAL | Status: DC

## 2013-08-09 NOTE — ED Notes (Signed)
Per EMS, patient was at her MD office for fevers, SOB, and Chest pain. On EKG, MD office found abnormalties and called for EMS. When patient woke up this morning, patient complained of feeling warm, chest pain, and SOB. Patient denies chest pain currently.

## 2013-08-09 NOTE — ED Provider Notes (Signed)
CSN: [id number]     Arrival date & time 08/09/13  1201 History   First MD Initiated Contact with Patient 08/09/13 1209     Chief Complaint  Patient presents with  . Chest Pain     (Consider location/radiation/quality/duration/timing/severity/associated sxs/prior Treatment) HPI Comments: Patient from PCPs office with cough, congestion, upper respiratory symptoms for the past 5 days. 2 days ago she had some soreness in her left chest that radiated to her left upper back lasted for about 2 hours. This went away. It is worse with coughing and movement. Today she the same pain in her chest it lasted just a few seconds. She now feels back to normal. This is associated with cough, congestion, runny nose and sore throat. She has no chest pain currently. Her EKG was changed at her PCPs office and she was sent to ED. She has no known history of coronary disease. She is high blood pressure, diabetic, hyperlipidemia She had a clean catheterization in 2010.   The history is provided by the patient and the EMS personnel.    Past Medical History  Diagnosis Date  . Diabetes mellitus   . Hypertension   . Hyperlipidemia   . Morbid obesity    Past Surgical History  Procedure Laterality Date  . Tonsillectomy    . Abdominal hysterectomy    . Cesarean section     Family History  Problem Relation Age of Onset  . Heart disease Mother   . Diabetes Mother   . Hypertension Sister   . Diabetes Sister    History  Substance Use Topics  . Smoking status: Never Smoker   . Smokeless tobacco: Never Used  . Alcohol Use: No   OB History   Grav Para Term Preterm Abortions TAB SAB Ect Mult Living                 Review of Systems  Constitutional: Negative for fever, activity change and appetite change.  HENT: Positive for congestion and rhinorrhea.   Respiratory: Positive for chest tightness and shortness of breath. Negative for cough.   Cardiovascular: Positive for chest pain.  Gastrointestinal:  Negative for nausea, vomiting and abdominal pain.  Genitourinary: Negative for dysuria, hematuria, vaginal bleeding and vaginal discharge.  Musculoskeletal: Negative.  Negative for arthralgias and myalgias.  Skin: Negative for rash.  Neurological: Negative for dizziness, weakness and headaches.  A complete 10 system review of systems was obtained and all systems are negative except as noted in the HPI and PMH.      Allergies  Penicillins  Home Medications   Current Outpatient Rx  Name  Route  Sig  Dispense  Refill  . acetaminophen (TYLENOL) 325 MG tablet   Oral   Take 1,300 mg by mouth every 6 (six) hours as needed.         Marland Kitchen amLODipine (NORVASC) 5 MG tablet   Oral   Take 5 mg by mouth daily.         . metFORMIN (GLUCOPHAGE) 500 MG tablet   Oral   Take 500 mg by mouth 2 (two) times daily.          Marland Kitchen olmesartan-hydrochlorothiazide (BENICAR HCT) 40-25 MG per tablet   Oral   Take 1 tablet by mouth daily.         . pseudoephedrine (SUDAFED) 60 MG tablet   Oral   Take 120 mg by mouth every 4 (four) hours as needed for congestion.         Marland Kitchen  Vitamin D, Ergocalciferol, (DRISDOL) 50000 UNITS CAPS capsule   Oral   Take 50,000 Units by mouth every 7 (seven) days. Every Wednesday         . levofloxacin (LEVAQUIN) 750 MG tablet   Oral   Take 1 tablet (750 mg total) by mouth daily.   7 tablet   0    BP 169/88  Pulse 82  Resp 18  SpO2 99%  LMP 08/10/2006 Physical Exam  Constitutional: She is oriented to person, place, and time. She appears well-developed and well-nourished. No distress.  HENT:  Head: Normocephalic and atraumatic.  Mouth/Throat: Oropharynx is clear and moist. No oropharyngeal exudate.  Eyes: Conjunctivae and EOM are normal. Pupils are equal, round, and reactive to light.  Neck: Normal range of motion. Neck supple.  Cardiovascular: Normal rate, regular rhythm, normal heart sounds and intact distal pulses.   No murmur heard. Equal radial pulses  and grip strengths  Pulmonary/Chest: Breath sounds normal. No respiratory distress. She exhibits tenderness.  Abdominal: Soft. There is no rebound and no guarding.  CP not reproducible  Musculoskeletal: Normal range of motion. She exhibits no edema and no tenderness.  Neurological: She is alert and oriented to person, place, and time. No cranial nerve deficit. She exhibits normal muscle tone. Coordination normal.  Skin: Skin is warm.    ED Course  Procedures (including critical care time) Labs Review Labs Reviewed  COMPREHENSIVE METABOLIC PANEL - Abnormal; Notable for the following:    Potassium 3.2 (*)    Glucose, Bld 119 (*)    GFR calc non Af Amer 79 (*)    All other components within normal limits  CBC WITH DIFFERENTIAL  TROPONIN I  D-DIMER, QUANTITATIVE  TROPONIN I   Imaging Review Dg Chest 2 View  08/09/2013   CLINICAL DATA:  Chest pain.  Short of breath.  EXAM: CHEST  2 VIEW  COMPARISON:  12/26/2008  FINDINGS: There is patchy airspace consolidation in the left upper lobe centered on the superior segment. This is consistent with pneumonia given the history.  Lungs are otherwise clear.  No pleural effusion.  Cardiac silhouette is mildly enlarged mediastinum is normal in contour.  Bony thorax is intact.  IMPRESSION: Left lower lobe pneumonia.   Electronically Signed   By: Amie Portland M.D.   On: 08/09/2013 13:14     EKG Interpretation   Date/Time:  Monday August 09 2013 12:13:10 EDT Ventricular Rate:  70 PR Interval:  166 QRS Duration: 88 QT Interval:  413 QTC Calculation: 446 R Axis:   -4 Text Interpretation:  Sinus rhythm LVH with secondary repolarization  abnormality Baseline wander in lead(s) II V2 No significant change was  found Confirmed by Manus Gunning  MD, [place] [phone]) on 08/09/2013 12:21:17 PM      MDM   Final diagnoses:  CAP (community acquired pneumonia)  LVH (left ventricular hypertrophy)   Chest pain is atypical for ACS. Now resolved. In setting of  recent URI type symptoms, doubt ACS or PE. NO chest pain at this time.  She has had recent cough, congestion and SOB. EKG shows T wave inversions inferior laterally consistent with LVH.  Patient's EKG does show T-wave inversions inferior laterally which are new from 2010. She had a clean catheterization in 2010. EKG today shows changes likely secondary to LVH. History is not consistent with ACS. Discussed with Dr. Myrtis Ser of cardiology who reviewed EKG and agrees.  Troponin Negative. D-dimer negative. Low suspicion for ACS, PE, or dissection. Troponin negative x2.  Patient is able to ambulate and maintain saturation >93%. She will be treated for pneumonia with levaquin.  BP 169/88  Pulse 82  Resp 18  SpO2 99%  LMP 08/10/2006 BP 169/88  Pulse 82  Resp 18  SpO2 99%  LMP 08/10/2006    Glynn OctaveStephen Classie Weng, MD 08/09/13 1746

## 2013-08-09 NOTE — Discharge Instructions (Signed)
Pneumonia, Adult Follow up with Dr. Ricki MillerPang this week. Return to the ED if you develop worsening chest pain, difficulty breathing, or any other concerns. Pneumonia is an infection of the lungs.  CAUSES Pneumonia may be caused by bacteria or a virus. Usually, these infections are caused by breathing infectious particles into the lungs (respiratory tract). SYMPTOMS   Cough.  Fever.  Chest pain.  Increased rate of breathing.  Wheezing.  Mucus production. DIAGNOSIS  If you have the common symptoms of pneumonia, your caregiver will typically confirm the diagnosis with a chest X-ray. The X-ray will show an abnormality in the lung (pulmonary infiltrate) if you have pneumonia. Other tests of your blood, urine, or sputum may be done to find the specific cause of your pneumonia. Your caregiver may also do tests (blood gases or pulse oximetry) to see how well your lungs are working. TREATMENT  Some forms of pneumonia may be spread to other people when you cough or sneeze. You may be asked to wear a mask before and during your exam. Pneumonia that is caused by bacteria is treated with antibiotic medicine. Pneumonia that is caused by the influenza virus may be treated with an antiviral medicine. Most other viral infections must run their course. These infections will not respond to antibiotics.  PREVENTION A pneumococcal shot (vaccine) is available to prevent a common bacterial cause of pneumonia. This is usually suggested for:  People over 29101 years old.  Patients on chemotherapy.  People with chronic lung problems, such as bronchitis or emphysema.  People with immune system problems. If you are over 65 or have a high risk condition, you may receive the pneumococcal vaccine if you have not received it before. In some countries, a routine influenza vaccine is also recommended. This vaccine can help prevent some cases of pneumonia.You may be offered the influenza vaccine as part of your care. If you  smoke, it is time to quit. You may receive instructions on how to stop smoking. Your caregiver can provide medicines and counseling to help you quit. HOME CARE INSTRUCTIONS   Cough suppressants may be used if you are losing too much rest. However, coughing protects you by clearing your lungs. You should avoid using cough suppressants if you can.  Your caregiver may have prescribed medicine if he or she thinks your pneumonia is caused by a bacteria or influenza. Finish your medicine even if you start to feel better.  Your caregiver may also prescribe an expectorant. This loosens the mucus to be coughed up.  Only take over-the-counter or prescription medicines for pain, discomfort, or fever as directed by your caregiver.  Do not smoke. Smoking is a common cause of bronchitis and can contribute to pneumonia. If you are a smoker and continue to smoke, your cough may last several weeks after your pneumonia has cleared.  A cold steam vaporizer or humidifier in your room or home may help loosen mucus.  Coughing is often worse at night. Sleeping in a semi-upright position in a recliner or using a couple pillows under your head will help with this.  Get rest as you feel it is needed. Your body will usually let you know when you need to rest. SEEK IMMEDIATE MEDICAL CARE IF:   Your illness becomes worse. This is especially true if you are elderly or weakened from any other disease.  You cannot control your cough with suppressants and are losing sleep.  You begin coughing up blood.  You develop pain which is getting  worse or is uncontrolled with medicines.  You have a fever.  Any of the symptoms which initially brought you in for treatment are getting worse rather than better.  You develop shortness of breath or chest pain. MAKE SURE YOU:   Understand these instructions.  Will watch your condition.  Will get help right away if you are not doing well or get worse. Document Released:  05/20/2005 Document Revised: 08/12/2011 Document Reviewed: 08/09/2010 [facility] Patient Information 2014 [place], [place].

## 2013-08-09 NOTE — ED Notes (Signed)
Ambulated patient while monitoring SPO2 level. Patient tolerated well and stayed between 93% and 98% on SPO2 monitor.

## 2013-08-09 NOTE — ED Notes (Signed)
Phlebotomy at the bedside  

## 2013-08-09 NOTE — ED Notes (Signed)
Patient returned from X-ray 

## 2013-08-09 NOTE — ED Notes (Signed)
Patient complains of "hot and cold chills" starting Wednesday with coughing, congestion. Patient complains of SOB, Chest pain starting on Saturday. Pain increases with coughing.

## 2013-08-19 ENCOUNTER — Encounter: Payer: Self-pay | Admitting: Family Medicine

## 2013-08-19 ENCOUNTER — Ambulatory Visit (INDEPENDENT_AMBULATORY_CARE_PROVIDER_SITE_OTHER): Payer: BC Managed Care – PPO | Admitting: Family Medicine

## 2013-08-19 VITALS — BP 164/90 | HR 78 | Temp 98.6°F | Ht 69.5 in | Wt 367.0 lb

## 2013-08-19 DIAGNOSIS — E782 Mixed hyperlipidemia: Secondary | ICD-10-CM | POA: Insufficient documentation

## 2013-08-19 DIAGNOSIS — I10 Essential (primary) hypertension: Secondary | ICD-10-CM

## 2013-08-19 DIAGNOSIS — I152 Hypertension secondary to endocrine disorders: Secondary | ICD-10-CM | POA: Insufficient documentation

## 2013-08-19 DIAGNOSIS — E119 Type 2 diabetes mellitus without complications: Secondary | ICD-10-CM

## 2013-08-19 DIAGNOSIS — E1159 Type 2 diabetes mellitus with other circulatory complications: Secondary | ICD-10-CM | POA: Insufficient documentation

## 2013-08-19 DIAGNOSIS — E785 Hyperlipidemia, unspecified: Secondary | ICD-10-CM

## 2013-08-19 LAB — BASIC METABOLIC PANEL
BUN: 13 mg/dL (ref 6–23)
CHLORIDE: 100 meq/L (ref 96–112)
CO2: 29 meq/L (ref 19–32)
CREATININE: 0.8 mg/dL (ref 0.4–1.2)
Calcium: 9.9 mg/dL (ref 8.4–10.5)
GFR: 101.6 mL/min (ref >60.00)
GLUCOSE: 122 mg/dL — AB (ref 70–99)
Potassium: 3.7 mEq/L (ref 3.5–5.1)
Sodium: 139 mEq/L (ref 135–145)

## 2013-08-19 LAB — MICROALBUMIN / CREATININE URINE RATIO
Creatinine,U: 79.1 mg/dL
Microalb Creat Ratio: 6.1 mg/g (ref 0.0–30.0)
Microalb, Ur: 4.8 mg/dL — ABNORMAL HIGH (ref 0.0–1.9)

## 2013-08-19 LAB — LIPID PANEL
CHOL/HDL RATIO: 3
CHOLESTEROL: 187 mg/dL (ref 0–200)
HDL: 55.7 mg/dL (ref >39.00)
LDL Cholesterol: 106 mg/dL — ABNORMAL HIGH (ref 0–99)
TRIGLYCERIDES: 127 mg/dL (ref 0.0–149.0)
VLDL: 25.4 mg/dL (ref 0.0–40.0)

## 2013-08-19 LAB — HEMOGLOBIN A1C: Hgb A1c MFr Bld: 7.2 % — ABNORMAL HIGH (ref 4.6–6.5)

## 2013-08-19 MED ORDER — LISINOPRIL 10 MG PO TABS: 10.0000 mg | ORAL_TABLET | Freq: Every day | ORAL | Status: DC

## 2013-08-19 MED ORDER — HYDROCHLOROTHIAZIDE 25 MG PO TABS: 25.0000 mg | ORAL_TABLET | Freq: Every day | ORAL | Status: DC

## 2013-08-19 NOTE — Progress Notes (Signed)
Chief Complaint  Patient presents with  . Establish Care  . Follow-up    pneumonia    HPI:  Jenna Stokes is here to establish care. [facility]  Last PCP and physical:  Has the following chronic problems and concerns today:  Patient Active Problem List   Diagnosis Date Noted  . Diabetes mellitus 08/19/2013  . Hyperlipemia 08/19/2013  . HTN (hypertension) 08/19/2013  . Morbid obesity    PNA: -tx in ED on 3/9 with levaquin and doing better -had some EKG changes, evaluated by cardiologist and this was normal -feeling much better -reports was unhappy that PCP office sent her to the ed for her cp  HTN: -reports usually well controlled -wants to stop the benicar  Health Maintenance:  ROS: See pertinent positives and negatives per HPI.  Past Medical History  Diagnosis Date  . Diabetes mellitus   . Hypertension   . Hyperlipidemia   . Morbid obesity     Family History  Problem Relation Age of Onset  . Heart disease Mother   . Diabetes Mother   . Hypertension Sister   . Diabetes Sister     History   Social History  . Marital Status: Single    Spouse Name: N/A    Number of Children: N/A  . Years of Education: N/A   Social History Main Topics  . Smoking status: Never Smoker   . Smokeless tobacco: Never Used  . Alcohol Use: No  . Drug Use: No  . Sexual Activity: None   Other Topics Concern  . 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 , Disp: , Rfl:  Vitamin D,  Ergocalciferol, (DRISDOL) 50000 UNITS CAPS capsule, Take 50,000 Units by mouth every 7 (seven) days. Every Wednesday, Disp: , Rfl: ;  hydrochlorothiazide (HYDRODIURIL) 25 MG tablet, Take 1 tablet (25 mg total) by mouth daily., Disp: 30 tablet, Rfl: 3;  levofloxacin (LEVAQUIN) 750 MG tablet, Take 1 tablet (750 mg total) by mouth daily., Disp: 7 tablet, Rfl: 0 lisinopril (PRINIVIL,ZESTRIL) 10 MG tablet, Take 1 tablet (10 mg total) by mouth daily., Disp: 30 tablet, Rfl: 3;  pseudoephedrine (SUDAFED) 60 MG tablet, Take 120 mg by mouth every 4 (four) hours as needed for congestion., Disp: , Rfl:   EXAM:  Filed Vitals:   08/19/13 1125  BP: 164/90  Pulse: 78  Temp: 98.6 F (37 C)    Body mass index is 53.44 kg/(m^2).  GENERAL: vitals reviewed and listed above, alert, oriented, appears well hydrated and in no acute distress  HEENT: atraumatic, conjunttiva clear, no obvious abnormalities on inspection of external nose and ears  NECK: no obvious masses on inspection  LUNGS: clear to auscultation bilaterally, no wheezes, rales or rhonchi, good air movement  CV: HRRR, no peripheral edema  MS: moves all extremities without noticeable abnormality  PSYCH: pleasant and cooperative, no obvious  depression or anxiety  ASSESSMENT AND PLAN:  Discussed the following assessment and plan:  Diabetes mellitus - Plan: Hemoglobin A1C, Basic metabolic panel, Microalbumin/Creatinine Ratio, Urine  Hyperlipemia - Plan: Lipid Panel  HTN (hypertension) - Plan: Basic metabolic panel, lisinopril (PRINIVIL,ZESTRIL) 10 MG tablet, hydrochlorothiazide (HYDRODIURIL) 25 MG tablet, DISCONTINUED: hydrochlorothiazide (HYDRODIURIL) 25 MG tablet, DISCONTINUED: lisinopril (PRINIVIL,ZESTRIL) 10 MG tablet  -We reviewed the PMH, PSH, FH, SH, Meds and Allergies. -We provided refills for any medications we will prescribe as needed. -We addressed current concerns per orders and patient instructions. -We have asked for records  for pertinent exams, studies, vaccines and notes from previous providers. -We have advised patient to follow up per instructions below. -FASTING LABS -follow up in 1 month ->45 minutes spent face to face with this patient  -Patient advised to return or notify a doctor immediately if symptoms worsen or persist or new concerns arise.  Patient Instructions  -stop the benicar  -start the hydrochlorothiazide and lisinopril daily  -We have ordered labs or studies at this visit. It can take up to 1-2 weeks for results and processing. We will contact you with instructions IF your results are abnormal. Normal results will be released to your [facility]. If you have not heard from [place] or can not find your results in [facility] in 2 weeks please contact our office.  -PLEASE SIGN UP FOR MYCHART TODAY   We recommend the following healthy lifestyle measures: - eat a healthy diet consisting of lots of vegetables, fruits, beans, nuts, seeds, healthy meats such as white chicken and fish and whole grains.  - avoid fried foods, fast food, processed foods, sodas, red meet and other fattening foods.  - get a least 150 minutes of aerobic exercise per week.   Follow up in: in 1 month for blood work      Toula Miyasaki, [employer].

## 2013-08-19 NOTE — Progress Notes (Signed)
Pre visit review using our clinic review tool, if applicable. No additional management support is needed unless otherwise documented below in the visit note. 

## 2013-08-19 NOTE — Patient Instructions (Signed)
-  stop the benicar  -start the hydrochlorothiazide and lisinopril daily  -We have ordered labs or studies at this visit. It can take up to 1-2 weeks for results and processing. We will contact you with instructions IF your results are abnormal. Normal results will be [facility] to your [facility]. If you have not heard from us or can not find your results [facility] in 2 weeks please contact our office.  -PLEASE SIGN UP FOR MYCHART TODAY   We recommend the following healthy lifestyle measures: - eat a healthy diet consisting of lots of vegetables, fruits, beans, nuts, seeds, healthy meats such as white chicken and fish and whole grains.  - avoid fried foods, fast food, processed foods, sodas, red meet and other fattening foods.  - get a least 150 minutes of aerobic exercise per week.   Follow up in: in 1 month for blood work

## 2013-08-24 ENCOUNTER — Telehealth: Payer: Self-pay | Admitting: Family Medicine

## 2013-08-24 MED ORDER — METFORMIN HCL 1000 MG PO TABS: 1000.0000 mg | ORAL_TABLET | Freq: Two times a day (BID) | ORAL | Status: DC

## 2013-08-24 NOTE — Addendum Note (Signed)
Addended by: Azucena FreedMILLNER, Lovelace Cerveny C on: 08/24/2013 11:57 AM   Modules accepted: Orders

## 2013-08-24 NOTE — Telephone Encounter (Signed)
Pt returning your call. Will be available for the next hour.

## 2013-08-24 NOTE — Telephone Encounter (Signed)
Spoke with pt see result note

## 2013-08-31 ENCOUNTER — Telehealth: Payer: Self-pay

## 2013-08-31 NOTE — Telephone Encounter (Signed)
Relevant patient education mailed to patient.  

## 2013-09-20 ENCOUNTER — Ambulatory Visit (INDEPENDENT_AMBULATORY_CARE_PROVIDER_SITE_OTHER): Payer: BC Managed Care – PPO | Admitting: Family Medicine

## 2013-09-20 ENCOUNTER — Encounter: Payer: Self-pay | Admitting: Family Medicine

## 2013-09-20 VITALS — BP 164/90 | Temp 99.4°F | Wt 371.6 lb

## 2013-09-20 DIAGNOSIS — I10 Essential (primary) hypertension: Secondary | ICD-10-CM

## 2013-09-20 DIAGNOSIS — J189 Pneumonia, unspecified organism: Secondary | ICD-10-CM

## 2013-09-20 DIAGNOSIS — E1165 Type 2 diabetes mellitus with hyperglycemia: Secondary | ICD-10-CM

## 2013-09-20 DIAGNOSIS — E1129 Type 2 diabetes mellitus with other diabetic kidney complication: Secondary | ICD-10-CM

## 2013-09-20 DIAGNOSIS — IMO0002 Reserved for concepts with insufficient information to code with codable children: Secondary | ICD-10-CM

## 2013-09-20 HISTORY — DX: Type 2 diabetes mellitus with other diabetic kidney complication: E11.65

## 2013-09-20 HISTORY — DX: Reserved for concepts with insufficient information to code with codable children: IMO0002

## 2013-09-20 HISTORY — DX: Type 2 diabetes mellitus with other diabetic kidney complication: E11.29

## 2013-09-20 MED ORDER — AMLODIPINE BESYLATE 5 MG PO TABS: 5.0000 mg | ORAL_TABLET | Freq: Every day | ORAL | Status: DC

## 2013-09-20 NOTE — Progress Notes (Signed)
Chief Complaint  Patient presents with  . Follow-up    HPI:  Follow up:  DM: -had advised to increase metfromin -metformin 1000mg  bid -advised to see diabetes educator but she declined -random BS around 150s -fasting is 130s  HLD/Obesity: -diet and exercise -is working on eliminating fried food except once per week, started walking again  HTN: -on norvasc 5mg , hctz 25mg , lisinopril 10mg  ROS: See pertinent positives and negatives per HPI.  Past Medical History  Diagnosis Date  . Hypertension   . Hyperlipidemia   . Morbid obesity   . 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 Tonsillectomy    . Abdominal hysterectomy    . Cesarean section      Family History  Problem Relation Age of Onset  . Heart disease Mother   . Diabetes Mother   . Hypertension Sister   . Diabetes Sister     History   Social History  . Marital Status: Single    Spouse Name: N/A    Number of Children: N/A  . Years of Education: N/A   Social History Main Topics  . Smoking status: Never Smoker   . Smokeless tobacco: Never Used  . Alcohol Use: No  . Drug Use: No  . Sexual Activity: None   Other Topics Concern  . 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      Home Situation:       Spiritual Beliefs:       Lifestyle: no regular exercise; diet is so so             Current outpatient prescriptions:acetaminophen (TYLENOL) 325 MG tablet, Take 1,300 mg by mouth every 6 (six) hours as needed., Disp: , Rfl: ;  hydrochlorothiazide (HYDRODIURIL) 25 MG tablet, Take 1 tablet (25 mg total) by mouth daily., Disp: 30 tablet, Rfl: 3;  lisinopril (PRINIVIL,ZESTRIL) 10 MG tablet, Take 1 tablet (10 mg total) by mouth daily., Disp: 30 tablet, Rfl: 3 metFORMIN (GLUCOPHAGE) 1000 MG tablet, Take 1 tablet (1,000 mg total) by mouth 2 (two) times daily with a meal., Disp: 180 tablet, Rfl: 3;   pseudoephedrine (SUDAFED) 60 MG tablet, Take 120 mg by mouth every 4 (four) hours as needed for congestion., Disp: , Rfl: ;  Vitamin D, Ergocalciferol, (DRISDOL) 50000 UNITS CAPS capsule, Take 50,000 Units by mouth every 7 (seven) days. Every Wednesday, Disp: , Rfl:  amLODipine (NORVASC) 5 MG tablet, Take 1 tablet (5 mg total) by mouth daily., Disp: 90 tablet, Rfl: 3  EXAM:  Filed Vitals:   09/20/13 1008  BP: 164/90  Temp: 99.4 F (37.4 C)    Body mass index is 54.11 kg/(m^2).  GENERAL: vitals reviewed and listed above, alert, oriented, appears well hydrated and in no acute distress  HEENT: atraumatic, conjunttiva clear, no obvious abnormalities on inspection of external nose and ears  NECK: no obvious masses on inspection  LUNGS: clear to auscultation bilaterally, no wheezes, rales or rhonchi, good air movement  CV: HRRR, no peripheral edema  MS: moves all extremities without noticeable abnormality  PSYCH: pleasant and cooperative, no obvious depression or anxiety  ASSESSMENT AND PLAN:  Discussed the following assessment and plan:  DM type 2, uncontrolled, with renal complications - Plan: amLODipine (NORVASC) 5 MG tablet  HTN (hypertension)  CAP (community acquired pneumonia) - Plan: DG Chest 2 View  -[facility] -please restart the norvasc -discussed lifestyle changes at length -she wants to recheck the CXR after her pneumonia -Patient advised to return or notify a doctor immediately if symptoms worsen or persist or new concerns arise.  Patient Instructions  -goal fasting blood sugar is under 130 and goal postprandial (2 hours after a meal) under 170  -restart the norvasc daily  -we sent the referral for the [facility] health coach program  -We placed a referral for you as discussed for a chest xray to recheck the xray. It usually takes about 1-2 weeks to process and schedule this referral. If you have not heard from us  regarding this appointment in 2 weeks please contact our office.  We recommend the following healthy lifestyle measures: - eat a healthy diet consisting of lots of vegetables, fruits, beans, nuts, seeds, healthy meats such as white chicken and fish and whole grains.  - avoid fried foods, fast food, processed foods, sodas, red meet and other fattening foods.  - get a least 150 minutes of aerobic exercise per week.   -follow up in 3 months (morning appointment and come fasting - drink plenty of water)     Jenna KoyanagiHannah R. Stokes Stokes

## 2013-09-20 NOTE — Patient Instructions (Addendum)
-  goal fasting blood sugar is under 130 and goal postprandial (2 hours after a meal) under 170  -restart the norvasc daily  -we sent the referral for the [facility] health coach program  -We placed a referral for you as discussed for a chest xray to recheck the xray. It usually takes about 1-2 weeks to process and schedule this referral. If you have not heard from us regarding this appointment in 2 weeks please contact our office.  We recommend the following healthy lifestyle measures: - eat a healthy diet consisting of lots of vegetables, fruits, beans, nuts, seeds, healthy meats such as white chicken and fish and whole grains.  - avoid fried foods, fast food, processed foods, sodas, red meet and other fattening foods.  - get a least 150 minutes of aerobic exercise per week.   -follow up in 3 months (morning appointment and come fasting - drink plenty of water)

## 2013-09-20 NOTE — Progress Notes (Signed)
Pre visit review using our clinic review tool, if applicable. No additional management support is needed unless otherwise documented below in the visit note. 

## 2013-09-21 ENCOUNTER — Telehealth: Payer: Self-pay | Admitting: Family Medicine

## 2013-09-21 NOTE — Telephone Encounter (Signed)
Relevant patient education assigned to patient using Emmi. ° °

## 2013-12-20 ENCOUNTER — Ambulatory Visit: Payer: BC Managed Care – PPO | Admitting: Family Medicine

## 2013-12-21 ENCOUNTER — Encounter: Payer: Self-pay | Admitting: Family Medicine

## 2013-12-21 ENCOUNTER — Ambulatory Visit (INDEPENDENT_AMBULATORY_CARE_PROVIDER_SITE_OTHER): Payer: BC Managed Care – PPO | Admitting: Family Medicine

## 2013-12-21 DIAGNOSIS — E785 Hyperlipidemia, unspecified: Secondary | ICD-10-CM

## 2013-12-21 DIAGNOSIS — L84 Corns and callosities: Secondary | ICD-10-CM

## 2013-12-21 DIAGNOSIS — E1129 Type 2 diabetes mellitus with other diabetic kidney complication: Secondary | ICD-10-CM

## 2013-12-21 DIAGNOSIS — E1165 Type 2 diabetes mellitus with hyperglycemia: Secondary | ICD-10-CM

## 2013-12-21 DIAGNOSIS — IMO0002 Reserved for concepts with insufficient information to code with codable children: Secondary | ICD-10-CM

## 2013-12-21 DIAGNOSIS — I10 Essential (primary) hypertension: Secondary | ICD-10-CM

## 2013-12-21 LAB — BASIC METABOLIC PANEL
BUN: 12 mg/dL (ref 6–23)
CALCIUM: 9.9 mg/dL (ref 8.4–10.5)
CO2: 27 mEq/L (ref 19–32)
Chloride: 100 mEq/L (ref 96–112)
Creatinine, Ser: 0.8 mg/dL (ref 0.4–1.2)
GFR: 101.45 mL/min (ref >60.00)
Glucose, Bld: 156 mg/dL — ABNORMAL HIGH (ref 70–99)
Potassium: 3.4 mEq/L — ABNORMAL LOW (ref 3.5–5.1)
Sodium: 136 mEq/L (ref 135–145)

## 2013-12-21 LAB — HEMOGLOBIN A1C: HEMOGLOBIN A1C: 7.5 % — AB (ref 4.6–6.5)

## 2013-12-21 LAB — LIPID PANEL
CHOLESTEROL: 169 mg/dL (ref 0–200)
HDL: 52.1 mg/dL (ref >39.00)
LDL CALC: 92 mg/dL (ref 0–99)
NonHDL: 116.9
Total CHOL/HDL Ratio: 3
Triglycerides: 126 mg/dL (ref 0.0–149.0)
VLDL: 25.2 mg/dL (ref 0.0–40.0)

## 2013-12-21 LAB — MICROALBUMIN / CREATININE URINE RATIO
Creatinine,U: 73.2 mg/dL
Microalb Creat Ratio: 3.6 mg/g (ref 0.0–30.0)
Microalb, Ur: 2.6 mg/dL — ABNORMAL HIGH (ref 0.0–1.9)

## 2013-12-21 NOTE — Patient Instructions (Addendum)
-  We have ordered labs or studies at this visit. It can take up to 1-2 weeks for results and processing. We will contact you with instructions IF your results are abnormal. Normal results will be released to your [facility]. If you have not heard from us or can not find your results in [facility] in 2 weeks please contact our office.  FOR YOUR DIABETES:  []  Eat a healthy low carb diet (avoid sweets, sweet drinks, breads, potatoes, rice, etc.) and ensure 3 small meals daily.  []  Get AT LEAST 150 minutes of cardiovascular exercise per week - 30 minutes per day is best of sustained sweaty exercise.  []  Take all of your medications every day as directed by your doctor. Call your doctor immediately if you have any questions about your medications or are running low.  []  Check your blood sugar often and when you feel unwell and keep a log to bring to all health appointments. FASTING: before you eat anything in the morning POSTPRANDIAL: 1-2 hours after a meal  []  If any low blood sugars < 70, eat a snack and call your doctor immediately.  []  See an eye doctor every year and fax your diabetic eye exam to our office.  Fax: [phone]  []  Take good care of your feet and keep them soft and callus free. Check your feet daily and wear comfortable shoes. See your doctor immediately if you have any cuts, calluses or wounds on your feet.  Schedule gynecology exam

## 2013-12-21 NOTE — Progress Notes (Signed)
Pre visit review using our clinic review tool, if applicable. No additional management support is needed unless otherwise documented below in the visit note. 

## 2013-12-21 NOTE — Progress Notes (Signed)
No chief complaint on file.   HPI:  DM, uncontrolled: -meds: advised metformin increase to 1000mg  bid last visit and diabetes educator -home BS: not checking -reports meter broke and she never went to see diabetes educator due to drive -last eye exam: no -last foot exam: not done -diet and exercise: walks daily for about 1 hour; diet is ok Denies: polyuria, polydipsia, wounds  Hyperlipidemia, goal LDL <100: -she preferred to try diet and exercise first  HTN: -meds:hctz 25, lisinpril 10, norvasc 5 -denies: CP, SOB, swelling  tdap and pneumonia vaccine advised  ROS: See pertinent positives and negatives per HPI.  Past Medical History  Diagnosis Date  . Hypertension   . Hyperlipidemia   . Morbid obesity   . 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 Tonsillectomy    . Abdominal hysterectomy    . Cesarean section      Family History  Problem Relation Age of Onset  . Heart disease Mother   . Diabetes Mother   . Hypertension Sister   . Diabetes Sister     History   Social History  . Marital Status: Single    Spouse Name: N/A    Number of Children: N/A  . Years of Education: N/A   Social History Main Topics  . Smoking status: Never Smoker   . Smokeless tobacco: Never Used  . Alcohol Use: No  . Drug Use: No  . Sexual Activity: None   Other Topics Concern  . 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      Home Situation:       Spiritual Beliefs:       Lifestyle: no regular exercise; diet is so so             Current outpatient prescriptions:acetaminophen (TYLENOL) 325 MG tablet, Take 1,300 mg by mouth every 6 (six) hours as needed., Disp: , Rfl: ;  amLODipine (NORVASC) 5 MG tablet, Take 1 tablet (5 mg total) by mouth daily., Disp: 90 tablet, Rfl: 3;  hydrochlorothiazide (HYDRODIURIL) 25 MG tablet, Take 1 tablet (25 mg total) by mouth daily.,  Disp: 30 tablet, Rfl: 3 lisinopril (PRINIVIL,ZESTRIL) 10 MG tablet, Take 1 tablet (10 mg total) by mouth daily., Disp: 30 tablet, Rfl: 3;  metFORMIN (GLUCOPHAGE) 1000 MG tablet, Take 1 tablet (1,000 mg total) by mouth 2 (two) times daily with a meal., Disp: 180 tablet, Rfl: 3  EXAM:  Filed Vitals:   12/21/13 0932  BP: 138/82  Pulse: 79  Temp: 98.3 F (36.8 C)    Body mass index is 53.85 kg/(m^2).  GENERAL: vitals reviewed and listed above, alert, oriented, appears well hydrated and in no acute distress  HEENT: atraumatic, conjunttiva clear, no obvious abnormalities on inspection of external nose and ears  NECK: no obvious masses on inspection  LUNGS: clear to auscultation bilaterally, no wheezes, rales or rhonchi, good air movement  CV: HRRR, no peripheral edema  MS: moves all extremities without noticeable abnormality  PSYCH: pleasant and cooperative, no obvious depression or anxiety  ASSESSMENT AND PLAN:  Discussed the following assessment and plan:  Morbid obesity  Hyperlipemia - Plan: Lipid Panel  Essential hypertension - Plan: Basic metabolic panel  DM type 2, uncontrolled, with renal complications - Plan: Hemoglobin A1c, Basic metabolic panel, Microalbumin/Creatinine Ratio, Urine, Ambulatory referral to Podiatry  Callus of foot - Plan: Ambulatory referral to Podiatry  -  Patient advised to return or notify a doctor immediately if symptoms worsen or persist or new concerns arise.  Patient Instructions  -We have ordered labs or studies at this visit. It can take up to 1-2 weeks for results and processing. We will contact you with instructions IF your results are abnormal. Normal results will be released to your [facility]. If you have not heard from [place] or can not find your results in [facility] in 2 weeks please contact our office.  FOR YOUR DIABETES:  []  Eat a healthy low carb diet (avoid sweets, sweet drinks, breads, potatoes, rice, etc.) and ensure 3 small meals  daily.  []  Get AT LEAST 150 minutes of cardiovascular exercise per week - 30 minutes per day is best of sustained sweaty exercise.  []  Take all of your medications every day as directed by your doctor. Call your doctor immediately if you have any questions about your medications or are running low.  []  Check your blood sugar often and when you feel unwell and keep a log to bring to all health appointments. FASTING: before you eat anything in the morning POSTPRANDIAL: 1-2 hours after a meal  []  If any low blood sugars < 70, eat a snack and call your doctor immediately.  []  See an eye doctor every year and fax your diabetic eye exam to our office.  Fax: [phone]  []  Take good care of your feet and keep them soft and callus free. Check your feet daily and wear comfortable shoes. See your doctor immediately if you have any cuts, calluses or wounds on your feet.  Schedule gynecology exam           Terressa Koyanagi.

## 2013-12-22 ENCOUNTER — Other Ambulatory Visit: Payer: Self-pay | Admitting: *Deleted

## 2013-12-22 DIAGNOSIS — E119 Type 2 diabetes mellitus without complications: Secondary | ICD-10-CM

## 2013-12-27 ENCOUNTER — Telehealth: Payer: Self-pay | Admitting: Family Medicine

## 2013-12-27 ENCOUNTER — Other Ambulatory Visit: Payer: Self-pay | Admitting: *Deleted

## 2013-12-27 MED ORDER — GLUCOSE BLOOD VI STRP: ORAL_STRIP | Status: DC

## 2013-12-27 NOTE — Telephone Encounter (Signed)
Rx was sent and I called the pt and informed her of this.

## 2013-12-27 NOTE — Telephone Encounter (Signed)
Pt needs strips for Blood Glucose monitoring machine [facility]  Best number to call pt is [phone], pt has not been able to check her sugar levels since 12/23/13, please expedite

## 2014-01-16 ENCOUNTER — Ambulatory Visit (INDEPENDENT_AMBULATORY_CARE_PROVIDER_SITE_OTHER): Payer: BC Managed Care – PPO

## 2014-01-16 ENCOUNTER — Ambulatory Visit (INDEPENDENT_AMBULATORY_CARE_PROVIDER_SITE_OTHER): Payer: BC Managed Care – PPO | Admitting: Family Medicine

## 2014-01-16 VITALS — BP 162/88 | HR 100 | Temp 97.7°F | Resp 18 | Ht 70.0 in | Wt 358.4 lb

## 2014-01-16 DIAGNOSIS — S99929A Unspecified injury of unspecified foot, initial encounter: Secondary | ICD-10-CM

## 2014-01-16 DIAGNOSIS — M25579 Pain in unspecified ankle and joints of unspecified foot: Secondary | ICD-10-CM

## 2014-01-16 DIAGNOSIS — S86812A Strain of other muscle(s) and tendon(s) at lower leg level, left leg, initial encounter: Secondary | ICD-10-CM

## 2014-01-16 DIAGNOSIS — M79609 Pain in unspecified limb: Secondary | ICD-10-CM

## 2014-01-16 DIAGNOSIS — S99919A Unspecified injury of unspecified ankle, initial encounter: Secondary | ICD-10-CM

## 2014-01-16 DIAGNOSIS — S8990XA Unspecified injury of unspecified lower leg, initial encounter: Secondary | ICD-10-CM

## 2014-01-16 DIAGNOSIS — S99912A Unspecified injury of left ankle, initial encounter: Secondary | ICD-10-CM

## 2014-01-16 DIAGNOSIS — M79662 Pain in left lower leg: Secondary | ICD-10-CM

## 2014-01-16 DIAGNOSIS — S838X9A Sprain of other specified parts of unspecified knee, initial encounter: Secondary | ICD-10-CM

## 2014-01-16 DIAGNOSIS — S86819A Strain of other muscle(s) and tendon(s) at lower leg level, unspecified leg, initial encounter: Secondary | ICD-10-CM

## 2014-01-16 DIAGNOSIS — M25572 Pain in left ankle and joints of left foot: Secondary | ICD-10-CM

## 2014-01-16 MED ORDER — HYDROCODONE-ACETAMINOPHEN 5-325 MG PO TABS: 1.0000 | ORAL_TABLET | Freq: Four times a day (QID) | ORAL | Status: DC | PRN

## 2014-01-16 NOTE — Progress Notes (Signed)
Subjective:    Patient ID: Jenna Stokes, female    DOB: Apr 16, 1966, 48 y.o.   MRN: [id number]  Ankle Pain  Pertinent negatives include no numbness.  Leg Pain  Pertinent negatives include no numbness.   Chief Complaint  Patient presents with  . Ankle Pain    Last night she jumped up to catch flowers at a wedding and landed on left ankle and leg wrong  . Leg Pain    Has a shooting pain from ankle to leg   This chart was scribed for Delman Cheadle, MD by Thea Alken, ED Scribe. This patient was seen in room 3 and the patient's care was started at 2:08 PM.  HPI Comments: Jenna Stokes is a 48 y.o. female who presents to the [facility] complaining of left leg pain. Pt reports she was at a wedding, trying to to catch the bouquet when she landed her left leg wrong. She reports she fell and was unable to apply pressure to leg after incident. Pt now has left lower leg down through ankle and foot. Pt reports she is unable to flex ankle and has pain with touch and movement.. She took 2 advil this morning and applied ice last night. Pt denies allergies to pain medication. Pt denies CP, SOB and palpitation    Past Medical History  Diagnosis Date  . Hypertension   . Hyperlipidemia   . Morbid obesity   . DM type 2, uncontrolled, with renal complications 3/73/4287   Past Surgical History  Procedure Laterality Date  . Tonsillectomy    . Abdominal hysterectomy    . Cesarean section     Prior to Admission medications   Medication Sig Start Date End Date Taking? Authorizing Provider  acetaminophen (TYLENOL) 325 MG tablet Take 1,300 mg by mouth every 6 (six) hours as needed.   Yes Historical Provider, MD  amLODipine (NORVASC) 5 MG tablet Take 1 tablet (5 mg total) by mouth daily. 09/20/13  Yes Lucretia Kern, DO  Blood Glucose Monitoring Suppl (ACCU-CHEK AVIVA PLUS) W/DEVICE KIT by Does not apply route. Kit given to patient at todays visit   Yes Historical Provider, MD  glucose  blood (ACCU-CHEK AVIVA PLUS) test strip 1 each by Other route as needed for other. Accu-chek Aviva Plus test strips called to Mia Creek, pharmacist at [facility]   Yes Historical Provider, MD  glucose blood test strip Use as instructed to check bloos sugar once a day 12/27/13  Yes Lucretia Kern, DO  hydrochlorothiazide (HYDRODIURIL) 25 MG tablet Take 1 tablet (25 mg total) by mouth daily. 08/19/13  Yes Lucretia Kern, DO  lisinopril (PRINIVIL,ZESTRIL) 10 MG tablet Take 1 tablet (10 mg total) by mouth daily. 08/19/13  Yes Lucretia Kern, DO  metFORMIN (GLUCOPHAGE) 1000 MG tablet Take 1 tablet (1,000 mg total) by mouth 2 (two) times daily with a meal. 08/24/13  Yes Lucretia Kern, DO   Review of Systems  Constitutional: Negative for fever and chills.  Respiratory: Negative for apnea and shortness of breath.   Cardiovascular: Negative for chest pain and palpitations.  Musculoskeletal: Positive for gait problem and myalgias.  Neurological: Negative for weakness and numbness.      BP 162/88  Pulse 100  Temp(Src) 97.7 F (36.5 C) (Oral)  Resp 18  Ht 5' 10"  (1.778 m)  Wt 358 lb 6.4 oz (162.569 kg)  BMI 51.42 kg/m2  SpO2 97%  LMP 08/10/2006 Objective:   Physical Exam  Nursing note and vitals reviewed. Constitutional: She is oriented to person, place, and time. She appears well-developed and well-nourished. No distress.  HENT:  Head: Normocephalic and atraumatic.  Eyes: Conjunctivae and EOM are normal.  Neck: Neck supple.  Cardiovascular: Normal rate.   Pulses:      Dorsalis pedis pulses are 2+ on the left side.  Pulmonary/Chest: Effort normal.  Musculoskeletal: Normal range of motion.  Pain over medial malleolus. No lateral malleolus pain. Pain over posterior to lateral aspect . No inferior lateral pain. No pain over 5th metatarsal. Pain over distal fibula. No pain over CF ligament, negative squeeze test. No abnormal inversion, eversion and flexion with foot at rest. Jenna intact by thompson  test. 30 cm from distal lateral malleolus calf measured 60 cm  Neurological: She is alert and oriented to person, place, and time.  Skin: Skin is warm and dry.  Psychiatric: She has a normal mood and affect. Her behavior is normal.   [facility] reading (PRIMARY) by Dr. Brigitte Pulse. Left foot no cute abnormality. calcaneous bone spurs. Left ankle no acute abnormality   Assessment & Plan:   Strain of calf muscle, left, initial encounter  Calf pain, left  Ankle injuries, left, initial encounter - Plan: DG Ankle Complete Left, DG Foot Complete Left  Pain in joint, ankle and foot, left - Plan: DG Ankle Complete Left, DG Foot Complete Left  Meds ordered this encounter  Medications  . HYDROcodone-acetaminophen (NORCO/VICODIN) 5-325 MG per tablet    Sig: Take 1 tablet by mouth every 6 (six) hours as needed for moderate pain.    Dispense:  30 tablet    Refill:  0    I personally performed the services described in this documentation, which was scribed in my presence. The recorded information has been reviewed and considered, and addended by me as needed.  Delman Cheadle, MD MPH

## 2014-01-16 NOTE — Patient Instructions (Addendum)
You need to wear the tall cam boot and use the crutches until you can walk without a limp.  When you can walk normally, transition into a supportive tennis shoe and consider heel orthotics/lifts to help.   Ice your calf four times a day for 20 minutes a time. There is some evidence that wearing a calf compression sleeve (a soft neoprene tube you pull over your calf) can help heal a little more quickly by reducing the amount of deep muscle bruising. Use tylenol, ibuprofen, or naproxen as needed during the day.  Use the hydrocodone as needed at night to help fall asleep. Make sure you come back to clinic immediately if you have any increase in pain, swelling, redness, shortness of breath, chest pain, or heart racing/skipping beats.  Medial Head Gastrocnemius Tear (Tennis Leg), with Rehab Medial head gastrocnemius tear, also called tennis leg, is a tear (strain) in a muscle or tendon of the inner portion (medial head) of one of the calf muscles (gastrocnemius). The inner portion of the calf muscle attaches to the thigh bone (femur) and is responsible for bending the knee and straightening the foot (standing "on tiptoe"). Strains are classified into three categories. Grade 1 strains cause pain, but the tendon is not lengthened. Grade 2 strains include a lengthened ligament, due to the ligament being stretched or partially ruptured. With grade 2 strains there is still function, although function may be decreased. Grade 3 strains involve a complete tear of the tendon or muscle, and function is usually impaired. SYMPTOMS   Sudden "pop" or tear felt at the time of injury.  Pain, tenderness, swelling, warmth, or redness over the middle inner calf.  Pain and weakness with ankle motion, especially flexing the ankle against resistance, as well as pain with lifting up the foot (extending the ankle).  Bruising (contusion) of the calf, heel, and sometimes the foot within 48 hours of injury.  Muscle spasm in the  calf. CAUSES  Muscle and ligament strains occur when a force is placed on the muscle or ligament that is greater than it can handle. Common causes of injury include:  Direct hit (trauma) to the calf.  Sudden forceful pushing off or landing on the foot (jumping, landing, serving a tennis ball, lunging). RISK INCREASES WITH:  Sports that require sudden, explosive calf muscle contraction, such as those involving jumping (basketball), hill running, quick starts (running), or lunging (racquetball, tennis).  Contact sports (football, soccer, hockey).  Poor strength and flexibility.  Previous lower limb injury. PREVENTION  Warm up and stretch properly before activity.  Allow for adequate recovery between workouts.  Maintain physical fitness:  Strength, flexibility, and endurance.  Cardiovascular fitness.  Learn and use proper exercise technique.  Complete rehabilitation after lower limb injury, before returning to competition or practice. PROGNOSIS  If treated properly, tennis leg usually heals within 6 weeks of nonsurgical treatment.  RELATED COMPLICATIONS   Longer healing time, if not properly treated or if not given enough time to heal.  Recurring symptoms and injury, if activity is resumed too soon, with overuse, with a direct blow, or with poor technique.  If untreated, may progress to a complete tear (rare) or other injury, due to limping and favoring of the injured leg.  Persistent limping, due to scarring and shortening of the calf muscles, as a result of inadequate rehabilitation.  Prolonged disability. TREATMENT  Treatment first involves the use of ice and medication to help reduce pain and inflammation. The use of strengthening and  stretching exercises may help reduce pain with activity. These exercises may be performed at home or with a therapist. For severe injuries, referral to a therapist may be needed for further evaluation and treatment. Your caregiver may  advise that you wear a brace to help healing. Sometimes, crutches are needed until you can walk without limping. Rarely, surgery is needed.  MEDICATION   If pain medicine is needed, nonsteroidal anti-inflammatory medicines (aspirin and ibuprofen), or other minor pain relievers (acetaminophen), are often advised.  Do not take pain medicine for 7 days before surgery.  Prescription pain relievers may be given, if your caregiver thinks they are needed. Use only as directed and only as much as you need. HEAT AND COLD  Cold treatment (icing) should be applied for 10 to 15 minutes every 2 to 3 hours for inflammation and pain, and immediately after activity that aggravates your symptoms. Use ice packs or an ice massage.  Heat treatment may be used before performing stretching and strengthening activities prescribed by your caregiver, physical therapist, or athletic trainer. Use a heat pack or a warm water soak. SEEK MEDICAL CARE IF:   Symptoms get worse or do not improve in 2 weeks, despite treatment.  Numbness or tingling develops.  New, unexplained symptoms develop. (Drugs used in treatment may produce side effects.) EXERCISES  RANGE OF MOTION (ROM) AND STRETCHING EXERCISES - Medial Head Gastrocnemius Tear (Tennis Leg) These exercises may help you when beginning to rehabilitate your injury. Your symptoms may resolve with or without further involvement from your physician, physical therapist, or athletic trainer. While completing these exercises, remember:   Restoring tissue flexibility helps normal motion to return to the joints. This allows healthier, less painful movement and activity.  An effective stretch should be held for at least 30 seconds.  A stretch should never be painful. You should only feel a gentle lengthening or release in the stretched tissue. STRETCH - Gastrocsoleus  Sit with your right / left leg extended. Holding onto both ends of a belt or towel, loop it around the ball  of your foot.  Keeping your right / left ankle and foot relaxed and your knee straight, pull your foot and ankle toward you using the belt.  You should feel a gentle stretch behind your calf or knee. Hold this position for __________ seconds. Repeat __________ times. Complete this stretch __________ times per day.  RANGE OF MOTION - Ankle Dorsiflexion, Active Assisted   Remove your shoes and sit on a chair, preferably not on a carpeted surface.  Place your right / left foot directly under the knee. Extend your opposite leg for support.  Keeping your heel down, slide your right / left foot back toward the chair, until you feel a stretch at your ankle or calf. If you do not feel a stretch, slide your bottom forward to the edge of the chair, while still keeping your heel down.  Hold this stretch for __________ seconds. Repeat __________ times. Complete this stretch __________ times per day.  STRETCH - Gastroc, Standing   Place your hands on a wall.  Extend your right / left leg behind you, keeping the front knee somewhat bent.  Slightly point your toes inward on your back foot.  Keeping your right / left heel on the floor and your knee straight, shift your weight toward the wall, not allowing your back to arch.  You should feel a gentle stretch in the right / left calf. Hold this position for __________ seconds.  Repeat __________ times. Complete this stretch __________ times per day. STRETCH - Soleus, Standing   Place your hands on a wall.  Extend your right / left leg behind you, keeping the other knee somewhat bent.  Point your toes of your back foot slightly inward.  Keep your right / left heel on the floor, bend your back knee, and slightly shift your weight over the back leg so that you feel a gentle stretch deep in your back calf.  Hold this position for __________ seconds. Repeat __________ times. Complete this stretch __________ times per day. STRETCH - Gastrocsoleus,  Standing Note: This exercise can place a lot of stress on your foot and ankle. Please complete this exercise only if specifically instructed by your caregiver.   Place the ball of your right / left foot on a step, keeping your other foot firmly on the same step.  Hold on to the wall or a rail for balance.  Slowly lift your other foot, allowing your body weight to press your heel down over the edge of the step.  You should feel a stretch in your right / left calf.  Hold this position for __________ seconds.  Repeat this exercise with a slight bend in your right / left knee. Repeat __________ times. Complete this stretch __________ times per day.  STRENGTHENING EXERCISES - Medial Head Gastrocnemius Tear (Tennis Leg) These exercises may help you when beginning to rehabilitate your injury. They may resolve your symptoms with or without further involvement from your physician, physical therapist, or athletic trainer. While completing these exercises, remember:   Muscles can gain both the endurance and the strength needed for everyday activities through controlled exercises.  Complete these exercises as instructed by your physician, physical therapist, or athletic trainer. Increase the resistance and repetitions only as guided by your caregiver. STRENGTH - Plantar-flexors  Sit with your right / left leg extended. Holding onto both ends of a rubber exercise band or tubing, loop it around the ball of your foot. Keep a slight tension in the band.  Slowly push your toes away from you, pointing them downward.  Hold this position for __________ seconds. Return slowly, controlling the tension in the band. Repeat __________ times. Complete this exercise __________ times per day.  STRENGTH - Plantar-flexors  Stand with your feet shoulder width apart. Steady yourself with a wall or table, using as little support as needed.  Keeping your weight evenly spread over the width of your feet, rise up on  your toes.*  Hold this position for __________ seconds. Repeat __________ times. Complete this exercise __________ times per day.  *If this is too easy, shift your weight toward your right / left leg until you feel challenged. Ultimately, you may be asked to do this exercise while standing on your right / left foot only. STRENGTH - Plantar-flexors, Eccentric Note: This exercise can place a lot of stress on your foot and ankle. Please complete this exercise only if specifically instructed by your caregiver.   Place the balls of your feet on a step. With your hands, use only enough support from a wall or rail to keep your balance.  Keep your knees straight and rise up on your toes.  Slowly shift your weight entirely to your right / left toes and pick up your opposite foot. Gently and with controlled movement, lower your weight through your right / left foot so that your heel drops below the level of the step. You will feel a slight  stretch in the back of your right / left calf.  Use the healthy leg to help rise up onto the balls of both feet, then lower weight only onto the right / left leg again. Build up to 15 repetitions. Then progress to 3 sets of 15 repetitions.*  After completing the above exercise, complete the same exercise with a slight knee bend (about 30 degrees). Again, build up to 15 repetitions. Then progress to 3 sets of 15 repetitions.* Perform this exercise __________ times per day.  *When you easily complete 3 sets of 15, your physician, physical therapist, or athletic trainer may advise you to add resistance, by wearing a backpack filled with additional weight. Document Released: 05/20/2005 Document Revised: 10/04/2013 Document Reviewed: 09/01/2008 [facility] Patient Information 2015 [employer], [place]. This information is not intended to replace advice given to you by your health care provider. Make sure you discuss any questions you have with your health care provider.

## 2014-01-18 ENCOUNTER — Telehealth: Payer: Self-pay

## 2014-01-18 NOTE — Telephone Encounter (Signed)
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 Pt should be taken out of work during this time frame. How long should she be out of work and can we provide her with this note?  She will be done with work today until 330.

## 2014-01-18 NOTE — Telephone Encounter (Signed)
PT STATES SHE WAS PUT IN A BOOT AND NOW HER JOB WANTED TO KNOW ABOUT RESTRICTIONS, HOW MUCH CAN SHE LIFT OR ANYTHING LIKE IT PLEASE CALL PT AT [phone]

## 2014-01-18 NOTE — Telephone Encounter (Signed)
Out of work note for 2 weeks. Will need to be released by a physician to [facility] so recheck in clinic in 2 wks or sooner if she is able to walk w/o a limp and leg is painfree.

## 2014-01-19 NOTE — Telephone Encounter (Signed)
Patient calling back in regards to her work note she needs for work. Patient employers wants to know if it is ok and safe for patient to lift when she is working. Patient doesn't feel that she can with wearing the boot. Patient also states she hasn't been able to take the pain medication while she is working. Patient states she is afraid to take the pain medicine at night because she is worried how she will feel the next day. Please call patient when the letter is ready [phone]

## 2014-01-20 ENCOUNTER — Encounter: Payer: Self-pay | Admitting: *Deleted

## 2014-01-20 NOTE — Telephone Encounter (Signed)
Pt notified that letter is ready for pick up

## 2014-01-21 ENCOUNTER — Telehealth: Payer: Self-pay | Admitting: Family Medicine

## 2014-01-21 NOTE — Telephone Encounter (Signed)
Patient Information:  [name] Name: Selena BattenKim  Phone: [phone]  Patient: Jenna Stokes, Jenna Stokes  Gender: Female  DOB: 10/22/1965  Age: 48 Years  PCP: Selena BattenKim (TEXT 1st, after 20 mins can call), [facility]  Pregnant: No  Office Follow Up:  Does the office need to follow up with this patient?: Yes  Instructions For The Office: Please call and advise.   Symptoms  Reason For Call & Symptoms: Pt is calling to say her ankle is swollen x 2 after an injury on 01/16/14. See cone Epic. Pt went to [facility]. She was diagnosed with a muscle tear and placed in a boot/told to call back if any new symptoms. Pt is calling now to say the back of that foot is swollen and numb to the touch (the swollen area) . Pt is diabetic and is concerned. Pt wanting to know if the next step is an office visit or otho referral? MD at [facility] told her an MRI would be the next step if symptoms like this developed.  Reviewed Health History In EMR: Yes  Reviewed Medications In EMR: Yes  Reviewed Allergies In EMR: Yes  Reviewed Surgeries / Procedures: Yes  Date of Onset of Symptoms: 01/16/2014 OB / GYN:  LMP: Unknown  Guideline(s) Used:  Ankle and Foot Injury  Disposition Per Guideline:   See Today in Office  Reason For Disposition Reached:   Large swelling or bruise and size > palm of person's hand  Advice Given:  N/A  RN Overrode Recommendation:  Follow Up With Office Later  Please call and advise if pt should be referred to ortho? or next step.

## 2014-01-21 NOTE — Telephone Encounter (Signed)
I called the pt and advised her per Dr Selena BattenKim [facility]ness and she agreed.  I advised her Dr Selena BattenKim stated she could place the referral to an orthopedist or see her next week and she stated she will call the orthopedic urgent care, she has their number and has been seen there before.

## 2014-01-22 ENCOUNTER — Telehealth: Payer: Self-pay

## 2014-01-22 NOTE — Telephone Encounter (Signed)
Patient states her foot has blood pooling in it and she is diabetic. I told her it was a good idea to come in. When she found out she would have to pay a copay, she wanted me to tell her what was wrong with her.  I told her - I did not know what was wrong with her. I told her I was clerical and could not diagnose her. She wants to talk to a doctor about this. Please advise at [phone]

## 2014-01-24 ENCOUNTER — Ambulatory Visit (INDEPENDENT_AMBULATORY_CARE_PROVIDER_SITE_OTHER): Payer: BC Managed Care – PPO | Admitting: Family Medicine

## 2014-01-24 ENCOUNTER — Ambulatory Visit (HOSPITAL_COMMUNITY): Payer: BC Managed Care – PPO | Attending: Family Medicine | Admitting: Cardiology

## 2014-01-24 ENCOUNTER — Encounter: Payer: Self-pay | Admitting: Family Medicine

## 2014-01-24 VITALS — BP 158/94 | HR 81 | Temp 98.8°F | Ht 70.0 in

## 2014-01-24 DIAGNOSIS — M7989 Other specified soft tissue disorders: Secondary | ICD-10-CM | POA: Diagnosis not present

## 2014-01-24 DIAGNOSIS — S86819A Strain of other muscle(s) and tendon(s) at lower leg level, unspecified leg, initial encounter: Secondary | ICD-10-CM

## 2014-01-24 DIAGNOSIS — S86812A Strain of other muscle(s) and tendon(s) at lower leg level, left leg, initial encounter: Secondary | ICD-10-CM

## 2014-01-24 DIAGNOSIS — M79662 Pain in left lower leg: Secondary | ICD-10-CM

## 2014-01-24 DIAGNOSIS — M79609 Pain in unspecified limb: Secondary | ICD-10-CM

## 2014-01-24 DIAGNOSIS — S838X9A Sprain of other specified parts of unspecified knee, initial encounter: Secondary | ICD-10-CM

## 2014-01-24 NOTE — Progress Notes (Signed)
Left lower venous duplex performed  

## 2014-01-24 NOTE — Telephone Encounter (Signed)
Spoke to pt, Thursday or Friday, heel, 5-6 inch red like a bruise, denies numbness or tingling. Knees are tingling, not in foot though.  i advised her to be seen as soon as possible. She does not wish to come due to copays and having to go to other facilities for an evaluation for this.  She stated she would like to make an appointment and i explained to her i did not know what would be available, but she needed to be seen as soon as possible for this. i transferred her to kim for scheduling.

## 2014-01-24 NOTE — Progress Notes (Signed)
No chief complaint on file.   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  Past Medical History  Diagnosis Date  . Hypertension   . Hyperlipidemia   . Morbid obesity   . DM type 2, uncontrolled, with renal complications 7/58/8325    Past Surgical History  Procedure Laterality Date  . Tonsillectomy    . Abdominal hysterectomy    . Cesarean section      Family History  Problem Relation Age of Onset  . Heart disease Mother   . Diabetes Mother   . Hypertension Sister   . Diabetes Sister     History   Social History  . Marital Status: Single    Spouse Name: N/A    Number of Children: N/A  . Years of Education: N/A   Social History Main Topics  . Smoking status: Never Smoker   . Smokeless tobacco: Never Used  . Alcohol Use: No  . Drug Use: No  . Sexual Activity: None   Other Topics Concern  . 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 Kit given to patient at todays visit, Disp: , Rfl:  glucose blood (ACCU-CHEK AVIVA PLUS) test strip, 1 each by Other route as needed for other. Accu-chek Aviva Plus test strips called to [place], pharmacist at [facility], Disp: , Rfl: ;  glucose blood test strip, Use as instructed to check bloos sugar once a day, Disp: 100 each, Rfl: 3;  hydrochlorothiazide (HYDRODIURIL) 25 MG tablet, Take 1 tablet (25 mg total) by mouth daily., Disp: 30 tablet, Rfl: 3 HYDROcodone-acetaminophen (NORCO/VICODIN) 5-325 MG per tablet, Take 1 tablet by mouth every 6 (six) hours as needed for moderate pain., Disp: 30 tablet, Rfl: 0;  lisinopril (PRINIVIL,ZESTRIL) 10 MG tablet, Take 1 tablet (10 mg total) by mouth daily., Disp: 30 tablet, Rfl: 3;  metFORMIN (GLUCOPHAGE) 1000 MG tablet, Take 1 tablet (1,000 mg total) by mouth 2 (two) times daily with a meal., Disp: 180 tablet, Rfl: 3  EXAM:  Filed Vitals:   01/24/14 1408  BP: 158/94  Pulse: 81  Temp: 98.8 F (37.1 C)    Body mass index is 0.00 kg/(m^2).  GENERAL: vitals reviewed and listed above, alert, oriented, appears well hydrated and in no acute  distress  HEENT: atraumatic, conjunttiva clear, no obvious abnormalities on inspection of external nose and ears  NECK: no obvious masses on inspection  LUNGS: clear to auscultation bilaterally, no wheezes, rales or rhonchi, good air movement  CV: HRRR, no peripheral edema  MS: moves all extremities without noticeable abnormality -in boot, normal gait -normal apperance of left leg, foot and ankle with very minimal bruising around medial ankle and mild TTP medial gastroc, no weakness or loss of sensation in L LE -no swelling, erythema or warmth  PSYCH: pleasant and cooperative, no obvious depression or anxiety  ASSESSMENT AND PLAN:  Discussed the following assessment and plan:  Calf pain, left - Plan: Lower Extremity Venous Duplex Left, CANCELED: Lower Extremity Venous Duplex  Left  Strain of calf muscle, left, initial encounter - Plan: Lower Extremity Venous Duplex Left, CANCELED: Lower Extremity Venous Duplex Left  -doubt DVT or significant tear given exam findings today - but LE duplex to exclude DVT per her concerns -follow up with ortho as scheduled -Patient advised to return or notify a doctor immediately if symptoms worsen or persist or new concerns arise.  There are no Patient Instructions on file for this visit.   Colin Benton R.

## 2014-01-24 NOTE — Progress Notes (Signed)
Pre visit review using our clinic review tool, if applicable. No additional management support is needed unless otherwise documented below in the visit note. 

## 2014-01-25 ENCOUNTER — Telehealth: Payer: Self-pay | Admitting: Family Medicine

## 2014-01-25 NOTE — Telephone Encounter (Signed)
Message copied by Terressa Koyanagi on Tue Jan 25, 2014 10:21 AM ------      Message from: Richardean Canal      Created: Mon Jan 24, 2014  4:27 PM      Regarding: prelim       Left lower extremity venous appears negative for acute thrombus. ------

## 2014-01-25 NOTE — Telephone Encounter (Signed)
No clot on prelim report. Informed [phone] (home) [phone] (work) She reports she is doing better.

## 2014-02-03 ENCOUNTER — Ambulatory Visit (INDEPENDENT_AMBULATORY_CARE_PROVIDER_SITE_OTHER): Payer: BC Managed Care – PPO | Admitting: Family Medicine

## 2014-02-03 VITALS — BP 152/90 | HR 78 | Temp 99.1°F | Resp 18 | Ht 70.0 in | Wt 358.0 lb

## 2014-02-03 DIAGNOSIS — M25579 Pain in unspecified ankle and joints of unspecified foot: Secondary | ICD-10-CM

## 2014-02-03 DIAGNOSIS — IMO0002 Reserved for concepts with insufficient information to code with codable children: Secondary | ICD-10-CM

## 2014-02-03 DIAGNOSIS — S86812S Strain of other muscle(s) and tendon(s) at lower leg level, left leg, sequela: Secondary | ICD-10-CM

## 2014-02-03 DIAGNOSIS — M25572 Pain in left ankle and joints of left foot: Secondary | ICD-10-CM

## 2014-02-03 MED ORDER — HYDROCODONE-ACETAMINOPHEN 5-325 MG PO TABS: 1.0000 | ORAL_TABLET | Freq: Four times a day (QID) | ORAL | Status: DC | PRN

## 2014-02-03 NOTE — Progress Notes (Signed)
Subjective:  This chart was scribed for Delman Cheadle, MD by Randa Evens, ED Scribe. This Patient was seen in room 03 and the patients care was started at 5:06 PM   Patient ID: Jenna Stokes, female    DOB: [birth year], 48 y.o.   MRN: [id number]  Chief Complaint  Patient presents with  . Follow-up    Left foot    HPI Jenna Stokes is a 48 y.o. female who presents for follow up of left foot pain.   Saw her 3 weeks ago the day after she had strained her left calf muscle. Was placed in a tall cam boot and given crutches advised to wear the cam boot until she could walk with out limp. Advised to apply ice, NSAID's, calf compression sleeve and elevation when possible. 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 She then developed swelling and bruising called her PCP and told her to go to urgent care orthopedic at [facility]. She also called here and was advised that she needed to be seen today. She then saw her PCP 2 weeks ago. Her musculoskeletal exam was normal and minimal bruising noted so a left lower extremity doppler was attained to rule out DVT which was negative. Pt advised follow up with ortho.   Today she states she is still having pain in her foot and calf. She states she has a gait problem and some bruising on the left leg. She states that she is currently still wearing the pain. She states that she has been doing ankle exercises but recently stop because the swelling continued to come back.  She states that on the bottom of her foot she notices that there is a knot. She states she also notices some bruising in the foot when she went to see her PCP. She states she notices the swelling more at night. She states that she has been icing the foot, applying the compression wrap when not in the boot and walking in the cam boot with no relief. She states that she walks in the boot about 3-4 hours per day. She states  that she is still taking the Vicodin during the day for pain that provides temporary relief.     Past Medical History  Diagnosis Date  . Hypertension   . Hyperlipidemia   . Morbid obesity   . DM type 2, uncontrolled, with renal complications 5/62/1308   Current Outpatient Prescriptions on File Prior to Visit  Medication Sig Dispense Refill  . amLODipine (NORVASC) 5 MG tablet Take 1 tablet (5 mg total) by mouth daily.  90 tablet  3  . Blood Glucose Monitoring Suppl (ACCU-CHEK AVIVA PLUS) W/DEVICE KIT by Does not apply route. Kit given to patient at todays visit      . glucose blood (ACCU-CHEK AVIVA PLUS) test strip 1 each by Other route as needed for other. Accu-chek Aviva Plus test strips called to [employer], pharmacist at [facility]      . glucose blood test strip Use as instructed to check bloos sugar once a day  100 each  3  . hydrochlorothiazide (HYDRODIURIL) 25 MG tablet Take 1 tablet (25 mg total) by mouth daily.  30 tablet  3  . HYDROcodone-acetaminophen (NORCO/VICODIN) 5-325 MG per tablet Take 1 tablet by mouth every 6 (six) hours as needed for moderate pain.  30 tablet  0  . lisinopril (PRINIVIL,ZESTRIL)  10 MG tablet Take 1 tablet (10 mg total) by mouth daily.  30 tablet  3  . metFORMIN (GLUCOPHAGE) 1000 MG tablet Take 1 tablet (1,000 mg total) by mouth 2 (two) times daily with a meal.  180 tablet  3   No current facility-administered medications on file prior to visit.   Allergies  Allergen Reactions  . Penicillins     unknown   Review of Systems  Musculoskeletal: Positive for arthralgias, gait problem and myalgias.    Objective:   BP 152/90  Pulse 78  Temp(Src) 99.1 F (37.3 C) (Oral)  Resp 18  Ht _0  (1.778 m)  Wt 358 lb (162.388 kg)  BMI 51.37 kg/m2  SpO2 97%  LMP 08/10/2006   Physical Exam  Nursing note and vitals reviewed. Constitutional: She is oriented to person, place, and time. She appears well-developed and well-nourished. No distress.  HENT:    Head: Normocephalic and atraumatic.  Eyes: Conjunctivae and EOM are normal.  Neck: Neck supple.  Cardiovascular: Normal rate.   Pulses:      Dorsalis pedis pulses are 2+ on the left side.  Posterior tibial not palpable on left leg, right leg Dorsalis pedis and posterior tibial not palapable  Pulmonary/Chest: Effort normal. No respiratory distress.  Musculoskeletal: Normal range of motion.  13 cm from lateral malleolus right leg is 36 cm and left is 36.5 cm. 15 cm form medial malleolus right leg is 43 cm and left is 41 cm. No tenderness over pes anserine bursa, mild crepitus  Neurological: She is alert and oriented to person, place, and time.  4/5 strenth left plantar flexion, 5/5 strength right plantar flexion, 4/5 dorsi flexion bilaterally   Skin: Skin is warm and dry.  Psychiatric: She has a normal mood and affect. Her behavior is normal.   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 Meds ordered this encounter  Medications  . HYDROcodone-acetaminophen (NORCO/VICODIN) 5-325 MG per tablet    Sig: Take 1 tablet by mouth every 6 (six) hours as needed for moderate pain.    Dispense:  30 tablet    Refill:  0    I personally performed the services described in this documentation, which was scribed in my presence. The recorded information has been reviewed and considered, and addended by me as needed.  Delman Cheadle, MD MPH

## 2014-02-03 NOTE — Progress Notes (Signed)
Primary care sports medicine fellow addendum: Patient is seen at the request of Dr. Clelia Croft. I reviewed the notes in her history and physical is consistent with a partial gastric rupture. Her Jenna Stokes test is normal but she is markedly weak plantar and dorsiflexion as well as significant tenderness palpation of the plantar aspect of her foot. Discussing options with her feel he should best be served by undergoing orthopedic evaluation for consideration of an MRI. I have recommended she start 2 Aleve twice per day until seen by orthopedist and she can continue taking the Percocet she was given. Recommend that she be scheduled to see Dr. Lajoyce Corners with [facility] orthopedics and she is agreeable to this plan.

## 2014-02-04 ENCOUNTER — Telehealth: Payer: Self-pay | Admitting: Family Medicine

## 2014-02-04 NOTE — Telephone Encounter (Signed)
Patient dropped off FMLA forms on 02/03/2014 and they were placed in Dr. Alver Fisher box on the following day 02/04/2014. Awaiting 5-7 business days for completion and will call patient to come pick up from office as she indicated on the form.

## 2014-02-08 NOTE — Telephone Encounter (Signed)
Pt called in inquiring about FMLA ppw, she stated that she spoke with Dr. Clelia Croft , and was told she would be able to expedite the 5-7 business day standard and have them done today (9/8) before 4. I did not see any documentation on this in the system, and have not received the ppw back at my desk. Told pt that I would look into it for her, and she would receive a phone call as soon as Jasmine or myself had any further information. Pt was dissatisfied with this, and said she would be here at three and expects the papers to be done at that time. Can I please have a status on this from Dr. Clelia Croft? Will check boxes again.

## 2014-02-08 NOTE — Telephone Encounter (Signed)
Finished papers for pt.  Will put in 102 front desk FMLA file near checkout

## 2014-02-08 NOTE — Telephone Encounter (Signed)
Patient notified that FMLA forms have been completed and are ready for pick up

## 2014-02-09 ENCOUNTER — Encounter: Payer: Self-pay | Admitting: Family Medicine

## 2014-02-11 DIAGNOSIS — Z0271 Encounter for disability determination: Secondary | ICD-10-CM

## 2014-04-30 ENCOUNTER — Other Ambulatory Visit: Payer: Self-pay | Admitting: Family Medicine

## 2014-07-27 ENCOUNTER — Other Ambulatory Visit: Payer: Self-pay | Admitting: Family Medicine

## 2014-09-29 ENCOUNTER — Other Ambulatory Visit: Payer: Self-pay | Admitting: *Deleted

## 2014-09-29 MED ORDER — HYDROCHLOROTHIAZIDE 25 MG PO TABS: 25.0000 mg | ORAL_TABLET | Freq: Every day | ORAL | Status: DC

## 2014-09-29 MED ORDER — LISINOPRIL 10 MG PO TABS: 10.0000 mg | ORAL_TABLET | Freq: Every day | ORAL | Status: DC

## 2014-10-24 ENCOUNTER — Other Ambulatory Visit: Payer: Self-pay | Admitting: Family Medicine

## 2014-12-20 ENCOUNTER — Telehealth: Payer: Self-pay | Admitting: Family Medicine

## 2014-12-20 DIAGNOSIS — IMO0002 Reserved for concepts with insufficient information to code with codable children: Secondary | ICD-10-CM

## 2014-12-20 DIAGNOSIS — E1165 Type 2 diabetes mellitus with hyperglycemia: Secondary | ICD-10-CM

## 2014-12-20 DIAGNOSIS — E1129 Type 2 diabetes mellitus with other diabetic kidney complication: Secondary | ICD-10-CM

## 2014-12-20 DIAGNOSIS — E785 Hyperlipidemia, unspecified: Secondary | ICD-10-CM

## 2014-12-20 DIAGNOSIS — I10 Essential (primary) hypertension: Secondary | ICD-10-CM

## 2014-12-20 NOTE — Telephone Encounter (Signed)
Pt call in to schedule an appt said she has not seen the doctor in over a year and would like to have labs drawn. I advise her that Dr Selena BattenKim does her labs the same day and she can come in and the doctor will place an order at her visit for labs. She said if Dr Selena BattenKim can not do her labs before her visit then she guess she will nee to find her a new doctor

## 2014-12-21 NOTE — Telephone Encounter (Signed)
Please schedule lab appt for fasting labs sometime in the next 1 month with follow up with me one week after lab appt. Please ask her to come 15 minutes prior to her appointment time and advise ensure keeps appt as we have not seen her in a while. Thank you.

## 2014-12-22 NOTE — Telephone Encounter (Signed)
Left a message for the pt to return my call.  

## 2015-01-25 ENCOUNTER — Other Ambulatory Visit: Payer: Self-pay | Admitting: *Deleted

## 2015-01-25 MED ORDER — LISINOPRIL 10 MG PO TABS: 10.0000 mg | ORAL_TABLET | Freq: Every day | ORAL | Status: DC

## 2015-01-25 MED ORDER — HYDROCHLOROTHIAZIDE 25 MG PO TABS: 25.0000 mg | ORAL_TABLET | Freq: Every day | ORAL | Status: DC

## 2015-01-31 NOTE — Telephone Encounter (Signed)
Left a message for the pt to return my call.  

## 2015-01-31 NOTE — Telephone Encounter (Signed)
Spoke with the pt and scheduled appts for lab and doctor visit.

## 2015-02-03 ENCOUNTER — Other Ambulatory Visit (INDEPENDENT_AMBULATORY_CARE_PROVIDER_SITE_OTHER): Payer: BC Managed Care – PPO

## 2015-02-03 DIAGNOSIS — E1129 Type 2 diabetes mellitus with other diabetic kidney complication: Secondary | ICD-10-CM | POA: Diagnosis not present

## 2015-02-03 DIAGNOSIS — IMO0002 Reserved for concepts with insufficient information to code with codable children: Secondary | ICD-10-CM

## 2015-02-03 DIAGNOSIS — E785 Hyperlipidemia, unspecified: Secondary | ICD-10-CM | POA: Diagnosis not present

## 2015-02-03 DIAGNOSIS — E1165 Type 2 diabetes mellitus with hyperglycemia: Secondary | ICD-10-CM

## 2015-02-03 DIAGNOSIS — I10 Essential (primary) hypertension: Secondary | ICD-10-CM | POA: Diagnosis not present

## 2015-02-03 LAB — BASIC METABOLIC PANEL
BUN: 10 mg/dL (ref 6–23)
CHLORIDE: 101 meq/L (ref 96–112)
CO2: 31 meq/L (ref 19–32)
Calcium: 10.1 mg/dL (ref 8.4–10.5)
Creatinine, Ser: 0.75 mg/dL (ref 0.40–1.20)
GFR: 105.65 mL/min (ref >60.00)
Glucose, Bld: 126 mg/dL — ABNORMAL HIGH (ref 70–99)
POTASSIUM: 3.6 meq/L (ref 3.5–5.1)
Sodium: 140 mEq/L (ref 135–145)

## 2015-02-03 LAB — LIPID PANEL
CHOL/HDL RATIO: 4
CHOLESTEROL: 178 mg/dL (ref 0–200)
HDL: 45.4 mg/dL (ref >39.00)
LDL CALC: 100 mg/dL — AB (ref 0–99)
NonHDL: 133.01
Triglycerides: 164 mg/dL — ABNORMAL HIGH (ref 0.0–149.0)
VLDL: 32.8 mg/dL (ref 0.0–40.0)

## 2015-02-03 LAB — HEMOGLOBIN A1C: Hgb A1c MFr Bld: 7.3 % — ABNORMAL HIGH (ref 4.6–6.5)

## 2015-02-14 ENCOUNTER — Ambulatory Visit: Payer: BC Managed Care – PPO | Admitting: Family Medicine

## 2015-02-15 ENCOUNTER — Ambulatory Visit (INDEPENDENT_AMBULATORY_CARE_PROVIDER_SITE_OTHER): Payer: BC Managed Care – PPO | Admitting: Family Medicine

## 2015-02-15 ENCOUNTER — Encounter: Payer: Self-pay | Admitting: Family Medicine

## 2015-02-15 VITALS — BP 142/92 | HR 92 | Temp 99.0°F | Ht 70.0 in | Wt 358.3 lb

## 2015-02-15 DIAGNOSIS — E1129 Type 2 diabetes mellitus with other diabetic kidney complication: Secondary | ICD-10-CM

## 2015-02-15 DIAGNOSIS — I10 Essential (primary) hypertension: Secondary | ICD-10-CM | POA: Diagnosis not present

## 2015-02-15 DIAGNOSIS — E785 Hyperlipidemia, unspecified: Secondary | ICD-10-CM

## 2015-02-15 DIAGNOSIS — Z0279 Encounter for issue of other medical certificate: Secondary | ICD-10-CM

## 2015-02-15 DIAGNOSIS — IMO0002 Reserved for concepts with insufficient information to code with codable children: Secondary | ICD-10-CM

## 2015-02-15 DIAGNOSIS — E1165 Type 2 diabetes mellitus with hyperglycemia: Secondary | ICD-10-CM

## 2015-02-15 MED ORDER — GLUCOSE BLOOD VI STRP: ORAL_STRIP | Status: DC

## 2015-02-15 MED ORDER — AMLODIPINE BESYLATE 5 MG PO TABS: 5.0000 mg | ORAL_TABLET | Freq: Every day | ORAL | Status: DC

## 2015-02-15 MED ORDER — LISINOPRIL 20 MG PO TABS: 20.0000 mg | ORAL_TABLET | Freq: Every day | ORAL | Status: DC

## 2015-02-15 MED ORDER — METFORMIN HCL 1000 MG PO TABS: ORAL_TABLET | ORAL | Status: DC

## 2015-02-15 MED ORDER — ONETOUCH ULTRASOFT LANCETS MISC: Status: DC

## 2015-02-15 MED ORDER — HYDROCHLOROTHIAZIDE 25 MG PO TABS: 25.0000 mg | ORAL_TABLET | Freq: Every day | ORAL | Status: DC

## 2015-02-15 MED ORDER — ONETOUCH VERIO FLEX SYSTEM W/DEVICE KIT: 1.0000 | PACK | Freq: Once | Status: DC

## 2015-02-15 NOTE — Progress Notes (Signed)
Pre visit review using our clinic review tool, if applicable. No additional management support is needed unless otherwise documented below in the visit note. 

## 2015-02-15 NOTE — Progress Notes (Signed)
HPI:  Jenna Stokes is a 49 yo F whom has not come in in over one year, here for follow up:  She is reluctant to do medications, afraid of side effects and she thinks her diabetes and other conditions are well controlled.  DM: -with renal complications, uncontrolled -meds: metformin  bid - but only taking once per day -eye exam: reports has seen optho with normal exam -foot exam: due -denies: polyuria, polydipsia, neuropathy, vision changes  Obesity: -diet and exercise: has been working on diet, feels like she has done well  HLD: -ldl 100 -meds: none  HTN: -norvac , hctz , lisinopril    ROS: See pertinent positives and negatives per HPI.  Past Medical History  Diagnosis Date  . Hypertension   . Hyperlipidemia   . Morbid obesity   . 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 Tonsillectomy    . Abdominal hysterectomy    . Cesarean section      Family History  Problem Relation Age of Onset  . Heart disease Mother   . Diabetes Mother   . Hypertension Sister   . Diabetes Sister     Social History   Social History  . Marital Status: Single    Spouse Name: N/A  . Number of Children: N/A  . Years of Education: N/A   Social History Main Topics  . Smoking status: Never Smoker   . Smokeless tobacco: Never Used  . Alcohol Use: No  . Drug Use: No  . Sexual Activity: Not Asked   Other Topics Concern  . 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  amLODipine (NORVASC) 5 MG tablet, Take 1 tablet (5 mg total) by mouth daily., Disp: 90 tablet, Rfl: 3 .  hydrochlorothiazide (HYDRODIURIL) 25 MG tablet, Take 1 tablet (25 mg total) by mouth daily., Disp: 90 tablet, Rfl: 3 .  lisinopril  (PRINIVIL,ZESTRIL) 20 MG tablet, Take 1 tablet (20 mg total) by mouth daily., Disp: 90 tablet, Rfl: 3 .  metFORMIN (GLUCOPHAGE) 1000 MG tablet, TAKE 1 TABLET BY MOUTH TWICE DAILY, Disp: 180 tablet, Rfl: 0  EXAM:  Filed Vitals:   02/15/15 1030  BP: 142/92  Pulse: 92  Temp: 99 F (37.2 C)    Body mass index is 51.41 kg/(m^2).  GENERAL: vitals reviewed and listed above, alert, oriented, appears well hydrated and in no acute distress  HEENT: atraumatic, conjunttiva clear, no obvious abnormalities on inspection of external nose and ears  NECK: no obvious masses on inspection  LUNGS: clear to auscultation bilaterally, no wheezes, rales or rhonchi, good air movement  CV: HRRR, no peripheral edema  MS: moves all extremities without noticeable abnormality  PSYCH: pleasant and cooperative, no obvious depression or anxiety  ASSESSMENT AND PLAN:  Discussed the following assessment and plan:  DM type 2, uncontrolled, with renal complications  Essential hypertension  Hyperlipemia  Morbid obesity  -she has poor understanding of disease and we spent most of the visit today on discussion of disease, risks, complications, treatment options, risks/benefits of medications -stressed importance of healthy lifestyle -advise increase metformin to normal dose -advised increase  lisinopril to 20mg  daily -close follow up to recheck BP -refused vaccines today but agrees to get at follow up -diabetic foot exam at follow up -Patient advised to return or notify a doctor immediately if symptoms worsen or persist or new concerns arise.  Patient Instructions  BEFORE YOU LEAVE: -schedule follow up in 1 month  Increase the metformin to the normal dose of 1000mg  twice daily with meals  Increase the lisinopril to 20mg  daily  We recommend the following healthy lifestyle measures: - eat a healthy diet consisting of small regular meals of vegetables, fruits, beans, nuts, seeds, healthy meats such as  white chicken and fish  - avoid fried foods, sweets, white starches, fast food, processed foods, sodas, red meet and other fattening foods.  - get a least 150 minutes of aerobic exercise per week.   Please send diabetic eye exam report to [place]  We will work on the papers you brought and fax when complete. This usually takes about 1-2 weeks, but we will work to get them done as soon as possible.     [name].

## 2015-02-15 NOTE — Patient Instructions (Signed)
BEFORE YOU LEAVE: -schedule follow up in 1 month  Increase the metformin to the normal dose of  twice daily with meals  Increase the lisinopril to  daily  We recommend the following healthy lifestyle measures: - eat a healthy diet consisting of small regular meals of vegetables, fruits, beans, nuts, seeds, healthy meats such as white chicken and fish  - avoid fried foods, sweets, white starches, fast food, processed foods, sodas, red meet and other fattening foods.  - get a least 150 minutes of aerobic exercise per week.   Please send diabetic eye exam report to [place]  We will work on the papers you brought and fax when complete. This usually takes about 1-2 weeks, but we will work to get them done as soon as possible.

## 2015-02-15 NOTE — Addendum Note (Signed)
Addended by: Johnella Moloney on: 02/15/2015 11:45 AM   Modules accepted: Orders

## 2015-02-19 ENCOUNTER — Other Ambulatory Visit: Payer: Self-pay | Admitting: Family Medicine

## 2015-03-16 ENCOUNTER — Ambulatory Visit (INDEPENDENT_AMBULATORY_CARE_PROVIDER_SITE_OTHER): Payer: BC Managed Care – PPO | Admitting: Family Medicine

## 2015-03-16 ENCOUNTER — Encounter: Payer: Self-pay | Admitting: Family Medicine

## 2015-03-16 ENCOUNTER — Ambulatory Visit: Payer: BC Managed Care – PPO | Admitting: Family Medicine

## 2015-03-16 VITALS — BP 152/82 | HR 87 | Temp 99.0°F | Ht 70.0 in | Wt 357.5 lb

## 2015-03-16 DIAGNOSIS — I10 Essential (primary) hypertension: Secondary | ICD-10-CM | POA: Diagnosis not present

## 2015-03-16 DIAGNOSIS — Z113 Encounter for screening for infections with a predominantly sexual mode of transmission: Secondary | ICD-10-CM

## 2015-03-16 DIAGNOSIS — Z23 Encounter for immunization: Secondary | ICD-10-CM

## 2015-03-16 DIAGNOSIS — A084 Viral intestinal infection, unspecified: Secondary | ICD-10-CM

## 2015-03-16 MED ORDER — AMLODIPINE BESYLATE 10 MG PO TABS: 10.0000 mg | ORAL_TABLET | Freq: Every day | ORAL | Status: DC

## 2015-03-16 NOTE — Progress Notes (Signed)
Pre visit review using our clinic review tool, if applicable. No additional management support is needed unless otherwise documented below in the visit note. 

## 2015-03-16 NOTE — Addendum Note (Signed)
Addended by: Johnella MoloneyFUNDERBURK, JO A on: 03/16/2015 12:10 PM   Modules accepted: Orders

## 2015-03-16 NOTE — Patient Instructions (Addendum)
BEFORE YOU LEAVE: -follow up in 1 month -flu shot - ok to get pneumococcal 23 if she wishes -consider Tdap next visit? -you just had your lab work done in September  Increase norvasc to 10 mg daily - [place] and Align probiotic for the bug. Also please avoid dairy for 1 week. Follow up as needed for this.  Please schedule your diabetic eye exam  Please provide us with a copy of your last pap smear  We recommend the following healthy lifestyle measures: - eat a healthy whole foods diet consisting of regular small meals composed of vegetables, fruits, beans, nuts, seeds, healthy meats such as white chicken and fish and whole grains.  - avoid sweets, white starchy foods, fried foods, fast food, processed foods, sodas, red meet and other fattening foods.  - get a least 150-300 minutes of aerobic exercise per week.

## 2015-03-16 NOTE — Progress Notes (Signed)
HPI:   Follow up HTN: -had gi bug this week with NVD for several days so may not have held down medications -denies: CP, SOb, DOE, HA  NVD: -started 3 days ago, now resolving -symptoms: watery diarrhea, nausea, vomiting, upper resp symptoms, low grade fever -no NVD today, starting to improve and went to work yesterday -wants to get flu shot  ROS: See pertinent positives and negatives per HPI.  Past Medical History  Diagnosis Date  . Hypertension   . Hyperlipidemia   . Morbid obesity ([place])   . DM type 2, uncontrolled, with renal complications ([place]) 2/48/2500    Past Surgical History  Procedure Laterality Date  . Tonsillectomy    . Abdominal hysterectomy    . Cesarean section      Family History  Problem Relation Age of Onset  . Heart disease Mother   . Diabetes Mother   . Hypertension Sister   . Diabetes Sister     Social History   Social History  . Marital Status: Single    Spouse Name: N/A  . Number of Children: N/A  . Years of Education: N/A   Social History Main Topics  . Smoking status: Never Smoker   . Smokeless tobacco: Never Used  . Alcohol Use: No  . Drug Use: No  . Sexual Activity: Not Asked   Other Topics Concern  . 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  amLODipine (NORVASC) 10 MG tablet, Take 1 tablet (10 mg total) by mouth daily., Disp: 90 tablet, Rfl: 3 .  Blood Glucose Monitoring Suppl (ONETOUCH VERIO FLEX SYSTEM) W/DEVICE KIT, 1 each by Does not apply route once., Disp: 1 kit, Rfl: 0 .  glucose blood (ONETOUCH VERIO) test strip, Use as instructed once a day, Disp: 100 each, Rfl: 1 .  hydrochlorothiazide (HYDRODIURIL) 25 MG tablet, Take 1 tablet (25 mg total) by mouth daily., Disp: 90 tablet, Rfl: 3 .  Lancets (ONETOUCH ULTRASOFT) lancets, Use  as instructed, Disp: 100 each, Rfl: 1 .  lisinopril (PRINIVIL,ZESTRIL) 20 MG tablet, Take 1 tablet (20 mg total) by mouth daily., Disp: 90 tablet, Rfl: 3 .  metFORMIN (GLUCOPHAGE) 1000 MG tablet, TAKE 1 TABLET BY MOUTH TWICE DAILY, Disp: 180 tablet, Rfl: 0  EXAM:  Filed Vitals:   03/16/15 1120  BP: 152/82  Pulse: 87  Temp: 99 F (37.2 C)    Body mass index is 51.3 kg/(m^2).  GENERAL: vitals reviewed and listed above, alert, oriented, appears well hydrated and in no acute distress  HEENT: atraumatic, conjunttiva clear, no obvious abnormalities on inspection of external nose and ears, normal appearance of ear canals and TMs, clear nasal congestion, mild post oropharyngeal erythema with PND, no tonsillar edema or exudate, no sinus TTP  NECK: no obvious masses on inspection  LUNGS: clear to auscultation bilaterally, no wheezes, rales or rhonchi, good air movement  CV: HRRR, no peripheral edema  MS: moves all extremities without noticeable abnormality  PSYCH: pleasant and cooperative, no obvious depression or anxiety  ASSESSMENT AND PLAN:  Discussed the following assessment and plan:  Essential hypertension  Viral gastroenteritis  -Patient advised to return or notify a doctor immediately if symptoms worsen  or persist or new concerns arise.  Patient Instructions  BEFORE YOU LEAVE: -follow up in 1 month -flu shot - ok to get pneumococcal 23 if she wishes -consider Tdap next visit?  Increase norvasc to 10 mg daily  Plenty of fluids and Align probiotic for the bug. Also please avoid dairy for 1 week. Follow up as needed for this.  Please schedule your diabetic eye exam  Please provide [place] with a copy of your last pap smear  We recommend the following healthy lifestyle measures: - eat a healthy whole foods diet consisting of regular small meals composed of vegetables, fruits, beans, nuts, seeds, healthy meats such as white chicken and fish and whole grains.  - avoid  sweets, white starchy foods, fried foods, fast food, processed foods, sodas, red meet and other fattening foods.  - get a least 150-300 minutes of aerobic exercise per week.       Colin Benton R.

## 2015-03-17 LAB — HIV ANTIBODY (ROUTINE TESTING W REFLEX): HIV 1&2 Ab, 4th Generation: NONREACTIVE

## 2015-04-03 ENCOUNTER — Telehealth: Payer: Self-pay | Admitting: Family Medicine

## 2015-04-03 NOTE — Telephone Encounter (Signed)
I think the pt is referring to the HIV test as I discussed the other results from 9/2 with her.

## 2015-04-03 NOTE — Telephone Encounter (Signed)
Please find out what her question is. HIV was negative. Thanks.

## 2015-04-03 NOTE — Telephone Encounter (Signed)
Pt call to say that she need a call back today about test results she had 2 weeks ago.

## 2015-04-03 NOTE — Telephone Encounter (Signed)
I called the pt and asked her what test she was referring to and she stated she was calling about the HIV test results as no one ever called her with results and she does not have a Mychart acct.  I advised her the HIV test was negative per Dr Selena BattenKim.

## 2015-04-03 NOTE — Telephone Encounter (Signed)
Jenna CollumJo Stokes, can you call her to see what questions she has? Needs follow up visit and plan to go over her labs and discuss tx options then. Thanks.

## 2015-04-19 ENCOUNTER — Telehealth: Payer: Self-pay | Admitting: Family Medicine

## 2015-04-19 NOTE — Telephone Encounter (Signed)
I left a detailed message stating I received the message she left on my voicemail about the form and wanted to know if she needed a copy of the DMV form from 9/23 to be left at the front desk or mailed to her and to call me back to let me know.

## 2015-04-19 NOTE — Telephone Encounter (Signed)
Left a message for the pt to return my call.  

## 2015-04-19 NOTE — Telephone Encounter (Signed)
Pt ask if you Randa EvensJoanne would give her a call . Did not want to give me any information

## 2015-04-20 NOTE — Telephone Encounter (Signed)
[facility]? See copy on your desk.

## 2015-04-20 NOTE — Telephone Encounter (Signed)
I spoke with the pt and she stated someone told her there was missing information on page 2

## 2015-05-02 LAB — HM DIABETES EYE EXAM

## 2015-05-03 ENCOUNTER — Other Ambulatory Visit: Payer: Self-pay | Admitting: Family Medicine

## 2015-05-12 ENCOUNTER — Encounter: Payer: Self-pay | Admitting: Family Medicine

## 2015-08-25 ENCOUNTER — Ambulatory Visit (INDEPENDENT_AMBULATORY_CARE_PROVIDER_SITE_OTHER)
Admission: RE | Admit: 2015-08-25 | Discharge: 2015-08-25 | Disposition: A | Discharge status: Home / Self Care | Payer: BC Managed Care – PPO | Source: Ambulatory Visit | Attending: Adult Health | Admitting: Adult Health

## 2015-08-25 ENCOUNTER — Ambulatory Visit (INDEPENDENT_AMBULATORY_CARE_PROVIDER_SITE_OTHER): Payer: BC Managed Care – PPO | Admitting: Adult Health

## 2015-08-25 ENCOUNTER — Telehealth: Payer: Self-pay | Admitting: Family Medicine

## 2015-08-25 ENCOUNTER — Other Ambulatory Visit: Payer: Self-pay | Admitting: Adult Health

## 2015-08-25 ENCOUNTER — Encounter: Payer: Self-pay | Admitting: Adult Health

## 2015-08-25 VITALS — BP 142/98 | HR 85 | Temp 99.1°F | Ht 70.0 in | Wt 361.0 lb

## 2015-08-25 DIAGNOSIS — R0602 Shortness of breath: Secondary | ICD-10-CM

## 2015-08-25 DIAGNOSIS — Z0000 Encounter for general adult medical examination without abnormal findings: Secondary | ICD-10-CM

## 2015-08-25 DIAGNOSIS — R946 Abnormal results of thyroid function studies: Secondary | ICD-10-CM | POA: Diagnosis not present

## 2015-08-25 DIAGNOSIS — R9431 Abnormal electrocardiogram [ECG] [EKG]: Secondary | ICD-10-CM

## 2015-08-25 LAB — CBC
HCT: 39.8 % (ref 36.0–46.0)
Hemoglobin: 13.3 g/dL (ref 12.0–15.0)
MCHC: 33.5 g/dL (ref 30.0–36.0)
MCV: 87.9 fl (ref 78.0–100.0)
PLATELETS: 193 10*3/uL (ref 150.0–400.0)
RBC: 4.53 Mil/uL (ref 3.87–5.11)
RDW: 13.2 % (ref 11.5–15.5)
WBC: 13.6 10*3/uL — ABNORMAL HIGH (ref 4.0–10.5)

## 2015-08-25 LAB — TSH: TSH: 0.79 mIU/L

## 2015-08-25 LAB — GLUCOSE, POCT (MANUAL RESULT ENTRY): POC Glucose: 128 mg/dl — AB (ref 70–99)

## 2015-08-25 MED ORDER — PREDNISONE 10 MG PO TABS: 10.0000 mg | ORAL_TABLET | Freq: Every day | ORAL | Status: DC

## 2015-08-25 MED ORDER — ALBUTEROL SULFATE HFA 108 (90 BASE) MCG/ACT IN AERS: 2.0000 | INHALATION_SPRAY | Freq: Four times a day (QID) | RESPIRATORY_TRACT | Status: DC | PRN

## 2015-08-25 MED ORDER — LEVOFLOXACIN 750 MG PO TABS: 750.0000 mg | ORAL_TABLET | Freq: Every day | ORAL | Status: DC

## 2015-08-25 NOTE — Telephone Encounter (Signed)
The patient regarding her labs and chest x-ray. Chest x-ray shows possible pneumonia. Called in Levaquin 750 mg daily 7 days. Follow-up with PCP

## 2015-08-25 NOTE — Addendum Note (Signed)
Addended by: Baldwin CrownJOHNSON, Jaileigh Weimer D on: 08/25/2015 05:09 PM   Modules accepted: Orders

## 2015-08-25 NOTE — Patient Instructions (Signed)
I have sent in a prescription for prednisone, take as directed   40 mg x 3 days 20 mg x 3 days 10 mg x 3 days.   This medication will make your blood sugars go up.   I have also sent in a prescription for albuterol inhaler,use as needed for Shortness of breath  Someone will call you to schedule a cardiology and stress test.

## 2015-08-25 NOTE — Progress Notes (Signed)
Subjective:    Patient ID: Jenna Stokes, female    DOB: 14-Mar-1966, 50 y.o.   MRN: [id number]  HPI  50 year old obese female who presents to the office for two months of wheezing and shortness of breath. She reports last night that she was the worst it has been, she was wheezing and was not able to catch her breath, last night she vomited 4-5 times due partially to the shortness of breath and phelm.   She denies  fevers or diarrhea.She has history of asthma. She had pneumonia about two years ago.  Review of Systems  Constitutional: Positive for activity change and fatigue. Negative for fever, chills, diaphoresis and unexpected weight change.  HENT: Positive for congestion. Negative for postnasal drip, rhinorrhea, sinus pressure and sore throat.   Respiratory: Positive for cough, chest tightness, shortness of breath and wheezing.   Cardiovascular: Negative.   Gastrointestinal: Positive for nausea and vomiting.  Skin: Negative.   Neurological: Positive for headaches. Negative for dizziness and weakness.   Past Medical History  Diagnosis Date  . Hypertension   . Hyperlipidemia   . Morbid obesity ([employer])   . DM type 2, uncontrolled, with renal complications ([place]) 08/24/5571    Social History   Social History  . Marital Status: Single    Spouse Name: N/A  . Number of Children: N/A  . Years of Education: N/A   Occupational History  . Not on file.   Social History Main Topics  . Smoking status: Never Smoker   . Smokeless tobacco: Never Used  . Alcohol Use: No  . Drug Use: No  . Sexual Activity: Not on file   Other Topics Concern  . 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 Tonsillectomy    . Abdominal hysterectomy    . Cesarean section      Family History    Problem Relation Age of Onset  . Heart disease Mother   . Diabetes Mother   . Hypertension Sister   . Diabetes Sister     Allergies  Allergen Reactions  . Penicillins     unknown    Current Outpatient Prescriptions on File Prior to Visit  Medication Sig Dispense Refill  . amLODipine (NORVASC) 10 MG tablet Take 1 tablet (10 mg total) by mouth daily. 90 tablet 3  . hydrochlorothiazide (HYDRODIURIL) 25 MG tablet Take 1 tablet (25 mg total) by mouth daily. 90 tablet 3  . lisinopril (PRINIVIL,ZESTRIL) 20 MG tablet Take 1 tablet (20 mg total) by mouth daily. 90 tablet 3  . metFORMIN (GLUCOPHAGE) 1000 MG tablet TAKE 1 TABLET BY MOUTH TWICE DAILY 60 tablet 0  . Blood Glucose Monitoring Suppl (ONETOUCH VERIO FLEX SYSTEM) W/DEVICE KIT 1 each by Does not apply route once. (Patient not taking: Reported on 08/25/2015) 1 kit 0  . glucose blood (ONETOUCH VERIO) test strip Use as instructed once a day (Patient not taking: Reported on 08/25/2015) 100 each 1  . Lancets (ONETOUCH ULTRASOFT) lancets Use as instructed (Patient not taking: Reported on 08/25/2015) 100 each 1   No current facility-administered medications on file prior to visit.    BP 142/98  mmHg  Pulse 85  Temp(Src) 99.1 F (37.3 C) (Oral)  Ht 5' 10"  (1.778 m)  Wt 361 lb (163.749 kg)  BMI 51.80 kg/m2  SpO2 95%  LMP 08/10/2006       Objective:   Physical Exam  Constitutional: She is oriented to person, place, and time. She appears well-developed and well-nourished. No distress.  Morbidly obese  HENT:  Head: Normocephalic and atraumatic.  Right Ear: Hearing, tympanic membrane, external ear and ear canal normal.  Left Ear: Hearing, tympanic membrane, external ear and ear canal normal.  Nose: Nose normal.  Mouth/Throat: Oropharynx is clear and moist. No oropharyngeal exudate.  Cardiovascular: Normal rate, regular rhythm, normal heart sounds and intact distal pulses.  Exam reveals no gallop and no friction rub.   No murmur  heard. Pulmonary/Chest: Effort normal. No respiratory distress. She has decreased breath sounds in the right lower field and the left lower field. She has no wheezes. She has no rales. She exhibits no tenderness.  Neurological: She is alert and oriented to person, place, and time.  Skin: Skin is warm and dry. No rash noted. She is not diaphoretic. No erythema. No pallor.  Psychiatric: She has a normal mood and affect. Her behavior is normal. Judgment and thought content normal.  Nursing note and vitals reviewed.      Assessment & Plan:  1. Abnormal EKG - EKG done today showed sinus rhythm with poor R-wave progression as well as nonspecific ST depression plus T-wave abnormality her rate was 73. EKG in March 2015 showed sinus rhythm with abnormal T wave and had slight ST depression. EKG today with more pronounced ST depression. Does appear to be some ST elevation on this EKG more so appears to be J-point. Review this with Dr. Sarajane Jews who agreed. - Last Leane Call was done in July 2010 which showed abnormal EKG at rest with no ischemia.  - Echo stress; Future - BMP with eGFR - CBC - POCT glucose (manual entry) - TSH  2. SOB (shortness of breath)  - EKG 12-Lead - Ambulatory referral to Cardiology - predniSONE (DELTASONE) 10 MG tablet; Take 1 tablet (10 mg total) by mouth daily with breakfast. 40 mg x 3 days, 67m x 3 days, 10 mg x 3 days.  Dispense: 21 tablet; Refill: 0 - albuterol (PROVENTIL HFA;VENTOLIN HFA) 108 (90 Base) MCG/ACT inhaler; Inhale 2 puffs into the lungs every 6 (six) hours as needed for wheezing or shortness of breath.  Dispense: 1 Inhaler; Refill: 2 - DG Chest 2 View; Future

## 2015-08-25 NOTE — Telephone Encounter (Signed)
Patient Name: Jenna Stokes DOB: 10-Mar-1966 Initial Comment [name] states has been getting short of breath, wheezing Nurse Assessment Nurse: Yetta BarreJones, RN, Miranda Date/Time (Eastern Time): 08/25/2015 8:49:39 AM Confirm and document reason for call. If symptomatic, describe symptoms. You must click the next button to save text entered. ---[name] states she has had episodes of wheezing and SOB off and on for 2 months. Last night she had an episode that last 2-3 hrs. She notices this only happens when she has sexual intercourse. Today she feels week and does not feel she can take a full break. Has the patient traveled out of the country within the last 30 days? ---No Does the patient have any new or worsening symptoms? ---Yes Will a triage be completed? ---Yes Related visit to physician within the last 2 weeks? ---No Does the PT have any chronic conditions? (i.e. diabetes, asthma, etc.) ---Yes List chronic conditions. ---Diabetes, HTN, High Cholesterol Is the patient pregnant or possibly pregnant? (Ask all females between the ages of 3012-55) ---No Is this a behavioral health or substance abuse call? ---No Guidelines Guideline Title Affirmed Question Affirmed Notes Breathing Difficulty [1] MILD difficulty breathing (e.g., minimal/no SOB at rest, SOB with walking, pulse <100) AND [2] NEW-onset or WORSE than normal Final Disposition User See Physician within 4 Hours (or PCP triage) Yetta BarreJones, RN, Miranda Comments No appt available with PCP. Appt scheduled for today at 10:15 am with Shirline Freesory Nafziger. Referrals REFERRED TO PCP OFFICE Disagree/Comply: Comply

## 2015-08-26 LAB — BASIC METABOLIC PANEL WITH GFR
BUN: 13 mg/dL (ref 7–25)
CO2: 24 mmol/L (ref 20–31)
Calcium: 9.4 mg/dL (ref 8.6–10.2)
Chloride: 103 mmol/L (ref 98–110)
Creat: 0.79 mg/dL (ref 0.50–1.10)
GFR, Est Non African American: 88 mL/min (ref >=60)
GLUCOSE: 130 mg/dL — AB (ref 65–99)
POTASSIUM: 3.1 mmol/L — AB (ref 3.5–5.3)
Sodium: 138 mmol/L (ref 135–146)

## 2015-08-29 ENCOUNTER — Telehealth (HOSPITAL_COMMUNITY): Payer: Self-pay | Admitting: *Deleted

## 2015-08-29 ENCOUNTER — Other Ambulatory Visit (HOSPITAL_COMMUNITY): Payer: BC Managed Care – PPO

## 2015-08-29 ENCOUNTER — Other Ambulatory Visit: Payer: Self-pay

## 2015-08-29 ENCOUNTER — Telehealth: Payer: Self-pay | Admitting: *Deleted

## 2015-08-29 ENCOUNTER — Ambulatory Visit (HOSPITAL_COMMUNITY): Payer: BC Managed Care – PPO

## 2015-08-29 ENCOUNTER — Ambulatory Visit (HOSPITAL_COMMUNITY): Payer: BC Managed Care – PPO | Attending: Cardiology

## 2015-08-29 MED ORDER — LABETALOL HCL 300 MG PO TABS: 300.0000 mg | ORAL_TABLET | Freq: Two times a day (BID) | ORAL | Status: DC

## 2015-08-29 NOTE — Telephone Encounter (Signed)
Patient BP is elevated to 198/110 and manual 218/114. Patient recently started Prednisone pack and not sleeping and has not taken her medication today. We are unable to perform Dobutamine echo test due to HTN. Dr. Selena BattenKim office notified of HTN. Spoke with Olegario MessierKathy at office and she will notify Dr. Selena BattenKim and get back with me.Rhyanna Sorce, Adelene IdlerCynthia W

## 2015-08-29 NOTE — Telephone Encounter (Signed)
Dr.Nishan consulted with patient HTN. Dr. Eden EmmsNishan ordered Labetalol and patient to follow up [facility]. Patient notified of these orders.Jansen Goodpasture, Adelene IdlerCynthia W

## 2015-08-29 NOTE — Telephone Encounter (Signed)
Brooke from [facility] notified us to have patient seen by DOD,go to ER or urgent care for HTN episode.Dorina Ribaudo, Adelene IdlerCynthia W

## 2015-08-29 NOTE — Telephone Encounter (Signed)
Received call from Olegario MessierKathy saying to call Arline AspCindy whom is with patient - Per Dr. Selena BattenKim: she advises patient to go to ER or Urgent Care. Arline AspCindy said she would relay this message back to patient. I gave Arline AspCindy my call back number and ext if more information was needed to be given or obtained.

## 2015-08-29 NOTE — Telephone Encounter (Signed)
Patient called very upset her test had been cancelled (stress echo) for today.   Testing was scheduled as a routine echo--this had to be cancelled for today--  Patient was very upset--it was not her fault the we have incompetent scheduling staff.  My doctor feels this test is important--I am coming for this test and Il will not take no for an answer.  She then asked for my name and title.  I gave her my name and told her I was the Producer, television/film/videoffice Supervisor.  I wanted to speak to the Office Manager, you don't even have any authority to do anything anyway.  You need to train your people before you place them on the floor to do a job.   I will have my test done today.  She then hung up.  I called and spoke with Evangeline GulaVanessa Douglas--Echo Supervisor to let her know the patient was coming and was very upset.  SHe stated they were going to do her test but she may have to wait .

## 2015-08-30 ENCOUNTER — Other Ambulatory Visit: Payer: Self-pay | Admitting: Family Medicine

## 2015-08-30 MED ORDER — HYDROCHLOROTHIAZIDE 25 MG PO TABS: 25.0000 mg | ORAL_TABLET | Freq: Every day | ORAL | Status: DC

## 2015-08-31 ENCOUNTER — Telehealth: Payer: Self-pay | Admitting: General Practice

## 2015-08-31 ENCOUNTER — Telehealth: Payer: Self-pay | Admitting: Family Medicine

## 2015-08-31 NOTE — Telephone Encounter (Signed)
Please call patient to see how we can help her. Let her know I am sorry that she has not been feeling well lately. Also, let her know that I looked over the chart regarding her last visit here with Denyse Amassorey and the recent blood pressure issues and would advise that she see cardiology for a visit about her EKG and her blood pressure. Please place referral. Happy to see her here in the interim if she has any other concerns - we have some openings tomorrow morning. Thank you.

## 2015-08-31 NOTE — Telephone Encounter (Signed)
Pt would like a call back from Dr Selena Battenkim as soon as possible. Pt refused to elaborate.

## 2015-08-31 NOTE — Telephone Encounter (Addendum)
We were slammed today in clinic and I did not get a chance call pt personally until now. Finally had a break in the schedule to call. Called pt. No answer. LM for patient to return call. She is on the schedule for tomorrow. I had advised my assistant and my nurse manager call pt today to return her call and address any questions promptly and to work her in for visit. Per my assistant she was rude and stated that I didn't care about patients if I couldn't take the time to call her. My nurse addressed questions, but patient was upset that I did not call her personally immediately and hung up on her.

## 2015-08-31 NOTE — Telephone Encounter (Signed)
Outgoing call to patient. Communicated to patient that Dr. Selena BattenKim is very sorry that she is not feeling well.  I asked her if she had any questions that I could direct to Dr. Selena BattenKim before her appointment in the morning.  Patient doesn't have questions at this time but is very concerned about her blood pressure.  She states "It would be nice if my doctors cared enough to call me back".  Recommendation was made to go to the ER if she needs to be seen between now and appointment time in the morning.  Recommendation was also made to take blood pressure medications as prescribed and to stay as calm as possible.  Patient states that b/p was 190/110 yesterday and that she hasn't been able to have it checked today.  Patient hung up on me.

## 2015-08-31 NOTE — Telephone Encounter (Signed)
I called the pt and informed her of the message below and she stated she is upset that no one has called her since she had her echo on Tuesday, she left a message yesterday with a nurse to call her back about an appt with Dr Selena BattenKim yesterday and she has been lying on her back and they told her the blood pressure reading was at stroke level.  I advised the pt we have not received a message to call her until this morning and I am calling now because Dr Selena BattenKim is seeing pts now.  I scheduled her an appt for tomorrow morning and she asked that Dr Selena BattenKim call her back.

## 2015-09-01 ENCOUNTER — Telehealth: Payer: Self-pay | Admitting: *Deleted

## 2015-09-01 ENCOUNTER — Ambulatory Visit (INDEPENDENT_AMBULATORY_CARE_PROVIDER_SITE_OTHER): Payer: BC Managed Care – PPO | Admitting: Family Medicine

## 2015-09-01 ENCOUNTER — Ambulatory Visit (INDEPENDENT_AMBULATORY_CARE_PROVIDER_SITE_OTHER)
Admission: RE | Admit: 2015-09-01 | Discharge: 2015-09-01 | Disposition: A | Discharge status: Home / Self Care | Payer: BC Managed Care – PPO | Source: Ambulatory Visit | Attending: Family Medicine | Admitting: Family Medicine

## 2015-09-01 ENCOUNTER — Encounter: Payer: Self-pay | Admitting: *Deleted

## 2015-09-01 ENCOUNTER — Encounter: Payer: Self-pay | Admitting: Family Medicine

## 2015-09-01 VITALS — BP 172/88 | HR 77 | Temp 99.0°F | Ht 70.0 in | Wt 364.1 lb

## 2015-09-01 DIAGNOSIS — R51 Headache: Secondary | ICD-10-CM

## 2015-09-01 DIAGNOSIS — E785 Hyperlipidemia, unspecified: Secondary | ICD-10-CM | POA: Diagnosis not present

## 2015-09-01 DIAGNOSIS — R519 Headache, unspecified: Secondary | ICD-10-CM

## 2015-09-01 DIAGNOSIS — E119 Type 2 diabetes mellitus without complications: Secondary | ICD-10-CM | POA: Diagnosis not present

## 2015-09-01 DIAGNOSIS — I160 Hypertensive urgency: Secondary | ICD-10-CM | POA: Diagnosis not present

## 2015-09-01 MED ORDER — IOPAMIDOL (ISOVUE-300) INJECTION 61%
80.0000 mL | Freq: Once | INTRAVENOUS | Status: AC | PRN
  Administered 2015-09-01: 80 mL via INTRAVENOUS

## 2015-09-01 MED ORDER — HYDRALAZINE HCL 25 MG PO TABS: 25.0000 mg | ORAL_TABLET | Freq: Two times a day (BID) | ORAL | Status: DC

## 2015-09-01 NOTE — Telephone Encounter (Signed)
I called the pt and informed her of the message below.  She asked when she was to have the test done that she could not have due to her blood pressure and I advised she call their office for information on rescheduling this test.

## 2015-09-01 NOTE — Patient Instructions (Signed)
Before you leave: -appt details for CT head -schedule follow up next week in the morning, come fasting but drink plenty of water and take your medication  For your headache we will get the CT scan.  The go to the emergency room if you are feeling worse, have serious symptoms, chest pain or trouble breathing.   Take all of your blood pressure medications every day and do not miss any doses.  Monitor your blood pressure 1  Time daily after sitting for 5 minutes. Keep a written log.  Please let us know if your blood pressure is running over 170 on the top over the weekend or over 90 on the bottom.  I did send in the hydralazine, another blood pressure medication that he could start once a day if needed. Potentially twice a day if needed.

## 2015-09-01 NOTE — Progress Notes (Signed)
HPI:  Jenna Stokes has a PMH significant for poor compliance with visits and recommendations, resistant HTN, Hypertensive urgency recently, DM, morbid obesity, HLD and an abnormal EKG. She did not follow up as advised after her last visit with me. She saw my colleague recently for Dyspnea and a resp illness and the EKG was abnormal. She was referred to cardiology and cardiology ordered an echo and she is scheduled to see them next month. She was unable to obtain due to elevated BP.  A cardiologist at the echo lab started her on labetolol 387m. Her other medications for HTN include lisinopril 289m hydrochlorthiazide 2526mnorvasc 44m30mShe has had a headache since her blood pressure has been running high for about 3-4 days.  She reports she wanted a CT scan of her head and thought she needed to go to the emergency room when she saw cardiology echo clinic, but that someone in the clinic told her that she could not do that and at the emergency room and that they didn't do CTs anymore. They told her to follow up here. She reports that she called here on Wednesday for an appointment and she was told by someone on the phone that they needed to check with me before they could schedule her an appointment on Thursday, but that I was out of the office. Reports they told her they would call her back, but did not. I was not in the office that afternoon. I do not see a phone for that day. She is frustrated. She is nervous about the medication that the cardiologist recommended for her blood pressure. She reports her breathing and cough have improved. Denies chest pain, palpitations, weakness, numbness, vision changes. She does not feel she can work with her blood pressure this high.   ROS: See pertinent positives and negatives per HPI.  Past Medical History  Diagnosis Date  . Hypertension   . Hyperlipidemia   . Morbid obesity (HCC)[place]. DM type 2, uncontrolled, with renal complications (HCC)[place]201/[id number]  Surgical History  Procedure Laterality Date  . Tonsillectomy    . Abdominal hysterectomy    . Cesarean section      Family History  Problem Relation Age of Onset  . Heart disease Mother   . Diabetes Mother   . Hypertension Sister   . Diabetes Sister     Social History   Social History  . Marital Status: Single    Spouse Name: N/A  . Number of Children: N/A  . Years of Education: N/A   Social History Main Topics  . Smoking status: Never Smoker   . Smokeless tobacco: Never Used  . Alcohol Use: No  . Drug Use: No  . Sexual Activity: Not Asked   Other Topics Concern  . 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  albuterol (PROVENTIL HFA;VENTOLIN HFA) 108 (90 Base) MCG/ACT inhaler, Inhale 2 puffs into the lungs every 6 (six) hours as needed for wheezing or shortness of breath., Disp: 1 Inhaler, Rfl: 2 .  amLODipine (NORVASC) 10 MG tablet, Take 1 tablet (10 mg total) by mouth daily., Disp: 90 tablet, Rfl: 3 .  Blood Glucose Monitoring Suppl (ONETOUCH VERIO FLEX SYSTEM) W/DEVICE KIT, 1 each by Does not apply route once. (Patient not taking: Reported on 08/25/2015), Disp: 1 kit, Rfl: 0 .  glucose blood (ONETOUCH VERIO) test strip, Use as instructed once a day (Patient not taking: Reported on 08/25/2015), Disp: 100 each, Rfl: 1 .  hydrALAZINE (APRESOLINE) 25 MG tablet, Take 1 tablet (25 mg total) by mouth 2 (two) times daily., Disp: 60 tablet, Rfl: 0 .  hydrochlorothiazide (HYDRODIURIL) 25 MG tablet, Take 1 tablet (25 mg total) by mouth daily., Disp: 30 tablet, Rfl: 0 .  labetalol (NORMODYNE) 300 MG tablet, Take 1 tablet (300 mg total) by mouth 2 (two) times daily., Disp: 60 tablet, Rfl: 1 .  Lancets (ONETOUCH ULTRASOFT) lancets, Use as instructed (Patient not taking: Reported on 08/25/2015),  Disp: 100 each, Rfl: 1 .  levofloxacin (LEVAQUIN) 750 MG tablet, Take 1 tablet (750 mg total) by mouth daily., Disp: 7 tablet, Rfl: 0 .  lisinopril (PRINIVIL,ZESTRIL) 20 MG tablet, Take 1 tablet (20 mg total) by mouth daily., Disp: 90 tablet, Rfl: 3 .  metFORMIN (GLUCOPHAGE) 1000 MG tablet, TAKE 1 TABLET BY MOUTH TWICE DAILY, Disp: 60 tablet, Rfl: 0 .  predniSONE (DELTASONE) 10 MG tablet, Take 1 tablet (10 mg total) by mouth daily with breakfast. 40 mg x 3 days, 25m x 3 days, 10 mg x 3 days., Disp: 21 tablet, Rfl: 0  EXAM:  Filed Vitals:   09/01/15 1024  BP: 172/88  Pulse: 77  Temp: 99 F (37.2 C)    Body mass index is 52.24 kg/(m^2).  GENERAL: vitals reviewed and listed above, alert, oriented, appears well hydrated and in no acute distress  HEENT: atraumatic, conjunttiva clear, no obvious abnormalities on inspection of external nose and ears  NECK: no obvious masses on inspection  LUNGS: clear to auscultation bilaterally, no wheezes, rales or rhonchi, good air movement  CV: HRRR, no peripheral edema  MS: moves all extremities without noticeable abnormality  PSYCH: pleasant and cooperative, no obvious depression or anxiety  ASSESSMENT AND PLAN:  Discussed the following assessment and plan: > 40 minutes spent with this patient with > 50% spent face to face in counseling.  Hypertensive urgency - Plan: CT Head W Wo Contrast, CANCELED: CT Head W Wo Contrast  Nonintractable headache, unspecified chronicity pattern, unspecified headache type - Plan: CT Head W Wo Contrast, CANCELED: CT Head W Wo Contrast  Hyperlipemia  Type 2 diabetes mellitus without complication, without long-term current use of insulin (HCC)  Morbid obesity, unspecified obesity type ([place]  -BP improving on check here, her cuff runs high -rx for new cuff given, hydralazine prn if continues to run high despite labetalol -she is upset about not getting echo and is upset cardiology is not seeing her soon, I  agreed to send message to cardiology to see if they can work in sooner or review chart and give recs on any testing to be done, assist with medications prior to her appt -follow up next week given her anxiety and to recheck BP and fasting labs -continue current medication, check stat CT given she is very worried about having a HA and elevated BP - neuro exam normal and she has had many factor (pneumonia, new medications, elevated BP that all could be contributing) -overall she seems to be improving with resolution breathing issues and cough -needs labs to catch up as she did not keep follow up on recommendations from last visit- Hgba1c, lipids,  -needs CPE/foot exam -  advised to schedule -advised to take all medications every day as instructed, advised using pill box and alarm on cell if needed -will have office manager f/u on her concerns regarding call wed for appt - not documentation of this call -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  Patient Instructions  Before you leave: -appt details for CT head -schedule follow up next week in the morning, come fasting but drink plenty of water and take your medication  For your headache we will get the CT scan.  The go to the emergency room if you are feeling worse, have serious symptoms, chest pain or trouble breathing.   Take all of your blood pressure medications every day and do not miss any doses.  Monitor your blood pressure 1  Time daily after sitting for 5 minutes. Keep a written log.  Please let us know if your blood pressure is running over 170 on the top over the weekend or over 90 on the bottom.  I did send in the hydralazine, another blood pressure medication that he could start once a day if needed. Potentially twice a day if needed.       Colin Benton R.

## 2015-09-01 NOTE — Telephone Encounter (Signed)
-----   Message from Terressa KoyanagiHannah R Kim, DO sent at 09/01/2015  1:54 PM EDT ----- Please let her know that I contacted the cardiologist that she is scheduled to see in April. I let him know what was going on.He said that she could check with them intermittently to see if there are any cancellations to be worked in sooner, but that the April visit is likely the soonest appointment.   ----- Message -----    From: Vesta MixerPhilip J Nahser, MD    Sent: 09/01/2015   1:43 PM      To: Terressa KoyanagiHannah R Kim, DO  My guess is that she is in the soonest appt but she or your nurse could check back to see if there are any cancellations   Phil   ----- Message -----    From: Terressa KoyanagiHannah R Kim, DO    Sent: 09/01/2015  12:51 PM      To: Vesta MixerPhilip J Nahser, MD  Hi, Sorry to bother you- sure you guys are slammed as we have been. This is a high risk pt, recent abnormal EKG, BP very elevated when she went to get Echo - working on that. No cp/new DOE/etc now. Recent PNA and SOB improving. She is quite upset that her echo was rescheduled, then that she couldn't get it due to htn and is demanding sooner visit with cardiologist (scheduled to see you late April I think). Advised, I'd try... or work with you on recs in interim. My colleague treated her appropriately with abx and steroids recently for PNA. May have contributed to BP issues, though she is historically poorly compliant too unfortunately. She is on a number of meds,  Labetalol added by Dr. Eden EmmsNishan a few days ago.  Thanks for any help you may provide. Dahlia ClientHannah

## 2015-09-04 ENCOUNTER — Ambulatory Visit (INDEPENDENT_AMBULATORY_CARE_PROVIDER_SITE_OTHER): Payer: BC Managed Care – PPO | Admitting: Family Medicine

## 2015-09-04 ENCOUNTER — Encounter: Payer: Self-pay | Admitting: Family Medicine

## 2015-09-04 VITALS — BP 159/90 | HR 69 | Temp 98.7°F | Ht 70.0 in | Wt 361.8 lb

## 2015-09-04 DIAGNOSIS — I160 Hypertensive urgency: Secondary | ICD-10-CM

## 2015-09-04 DIAGNOSIS — E785 Hyperlipidemia, unspecified: Secondary | ICD-10-CM | POA: Diagnosis not present

## 2015-09-04 DIAGNOSIS — L84 Corns and callosities: Secondary | ICD-10-CM

## 2015-09-04 DIAGNOSIS — R0609 Other forms of dyspnea: Secondary | ICD-10-CM

## 2015-09-04 DIAGNOSIS — I10 Essential (primary) hypertension: Secondary | ICD-10-CM

## 2015-09-04 DIAGNOSIS — R0600 Dyspnea, unspecified: Secondary | ICD-10-CM

## 2015-09-04 DIAGNOSIS — E118 Type 2 diabetes mellitus with unspecified complications: Secondary | ICD-10-CM

## 2015-09-04 LAB — LIPID PANEL
CHOL/HDL RATIO: 5
CHOLESTEROL: 200 mg/dL (ref 0–200)
HDL: 41.9 mg/dL (ref >39.00)
LDL CALC: 129 mg/dL — AB (ref 0–99)
NONHDL: 157.93
Triglycerides: 144 mg/dL (ref 0.0–149.0)
VLDL: 28.8 mg/dL (ref 0.0–40.0)

## 2015-09-04 LAB — HEMOGLOBIN A1C: HEMOGLOBIN A1C: 8.2 % — AB (ref 4.6–6.5)

## 2015-09-04 NOTE — Patient Instructions (Addendum)
BEFORE YOU LEAVE: -nurse visit in 1 week to check blood pressure -labs -? tdap -follow up in 3 months  Start the hydralazine and take twice daily, monitor blood pressure and can increase to 3x daily if needed. Continue the 4 other blood pressure medications.  Work on a clean diet and avoid processed foods and eating out.  Keep the appointment with cardiology. No strenuous activity until evaluated.  Baby aspirin daily.  -We placed a referral for you as discussed to the nephrologist for the resistant hypertension and to the foot doctor. It usually takes about 1-2 weeks to process and schedule this referral. If you have not heard from us regarding this appointment in 2 weeks please contact our office.  We recommend the following healthy lifestyle measures: - eat a healthy whole foods diet consisting of regular small meals composed of vegetables, fruits, beans, nuts, seeds, healthy meats such as white chicken and fish and whole grains.  - avoid sweets, white starchy foods, fried foods, fast food, processed foods, sodas, red meet and other fattening foods.

## 2015-09-04 NOTE — Progress Notes (Addendum)
HPI:  Follow up:  HTN/abnormal EKG/DOE: -had eval with cardiology in the past, pending another cardiology evaluation later this month -bp 150-180/80-90s at home still  -she reports she feels much better now with resolution headaches -poor diet - eats out a lot -reports taking all medications except hydralazine -meds: hxtz 25, lisinopril 20, norvasc 10,labetolol 300, hydralazine 25 prn -denies: CP, SOB, palpitations, headaches now, swelling, palpitations -has rare DOE for several months - I wrote to cardiology after she was unable to get echo as she wanted to see them sooner, they are booked and advised she could call to see if any cancelation come up  Headache: -when BP was elevated recently -CT neg for acute changes, reviewed findings with pt -headache now resolved  DM/obesity: -meds: asa? Metformin 1000bid -took prednisone recently -needs lipid panel today -poor compliance with follow up -lifestyle: eats poorly - eats out mostly; no regular exercise -eye exam: not done -foot exam:today  HM: needs pap and Tdap ROS: See pertinent positives and negatives per HPI.  Past Medical History  Diagnosis Date  . Hypertension   . Hyperlipidemia   . Morbid obesity ([place])   . DM type 2, uncontrolled, with renal complications ([place]) 01/11/9146    Past Surgical History  Procedure Laterality Date  . Tonsillectomy    . Abdominal hysterectomy    . Cesarean section      Family History  Problem Relation Age of Onset  . Heart disease Mother   . Diabetes Mother   . Hypertension Sister   . Diabetes Sister     Social History   Social History  . Marital Status: Single    Spouse Name: N/A  . Number of Children: N/A  . Years of Education: N/A   Social History Main Topics  . Smoking status: Never Smoker   . Smokeless tobacco: Never Used  . Alcohol Use: No  . Drug Use: No  . Sexual Activity: Not Asked   Other Topics Concern  . 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  albuterol (PROVENTIL HFA;VENTOLIN HFA) 108 (90 Base) MCG/ACT inhaler, Inhale 2 puffs into the lungs every 6 (six) hours as needed for wheezing or shortness of breath., Disp: 1 Inhaler, Rfl: 2 .  amLODipine (NORVASC) 10 MG tablet, Take 1 tablet (10 mg total) by mouth daily., Disp: 90 tablet, Rfl: 3 .  Blood Glucose Monitoring Suppl (ONETOUCH VERIO FLEX SYSTEM) W/DEVICE KIT, 1 each by Does not apply route once., Disp: 1 kit, Rfl: 0 .  glucose blood (ONETOUCH VERIO) test strip, Use as instructed once a day, Disp: 100 each, Rfl: 1 .  hydrALAZINE (APRESOLINE) 25 MG tablet, Take 1 tablet (25 mg total) by mouth 2 (two) times daily., Disp: 60 tablet, Rfl: 0 .  hydrochlorothiazide (HYDRODIURIL) 25 MG tablet, Take 1 tablet (25 mg total) by mouth daily., Disp: 30 tablet, Rfl: 0 .  labetalol (NORMODYNE) 300 MG tablet, Take 1 tablet (300 mg total) by mouth 2 (two) times daily., Disp: 60 tablet, Rfl: 1 .  Lancets (ONETOUCH ULTRASOFT) lancets, Use as instructed, Disp: 100 each, Rfl: 1 .  lisinopril (PRINIVIL,ZESTRIL) 20 MG tablet, Take 1 tablet (20 mg total) by mouth  daily., Disp: 90 tablet, Rfl: 3 .  metFORMIN (GLUCOPHAGE) 1000 MG tablet, TAKE 1 TABLET BY MOUTH TWICE DAILY, Disp: 60 tablet, Rfl: 0  EXAM:  Filed Vitals:   09/04/15 0910  BP: 159/90  Pulse: 69  Temp: 98.7 F (37.1 C)    Body mass index is 51.91 kg/(m^2).  GENERAL: vitals reviewed and listed above, alert, oriented, appears well hydrated and in no acute distress  HEENT: atraumatic, conjunttiva clear, no obvious abnormalities on inspection of external nose and ears  NECK: no obvious masses on inspection  LUNGS: clear to auscultation bilaterally, no wheezes, rales or rhonchi, good air movement  CV: HRRR, no peripheral edema  MS: moves all  extremities without noticeable abnormality  PSYCH: pleasant and cooperative, no obvious depression or anxiety  ASSESSMENT AND PLAN:  Discussed the following assessment and plan:  Hypertensive urgency - Plan: Ambulatory referral to Nephrology  Type 2 diabetes mellitus with complication, without long-term current use of insulin ([place]) - Plan: Ambulatory referral to Podiatry, Hemoglobin A1c  Hyperlipemia - Plan: Lipid Panel  Essential hypertension  Morbid obesity, unspecified obesity type ([place])  DOE (dyspnea on exertion)  Callus of foot - Plan: Ambulatory referral to Podiatry  -reviewed CT scan with pt -she is feeling much better and seem BP is trending down, though still running high despite 4 antihypertensives and after discussion opted to add hydralazine (she had not done this yet) and have her see nephrology; nurse recheck in 1 week -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 Discussed emergency precautions in interim in case symptoms develop. -advised healthy diet, regular exercise once cleared by cardiology, asa, good compliance with visits and meds -foot exam done, referral to podiatry for calluses, advised good foot care and amlactin foot cream, advised checking feet nightly -FASTING lipids -hgba1c (did have steroids recently for CAP) -Tdap offered -follow up 3 months, will need physical with pap once acute issues addressed and caught up on chronic issues -Patient advised to return or notify a doctor immediately if symptoms worsen or persist or new concerns arise.  Patient Instructions  BEFORE YOU LEAVE: -nurse visit in 1 week to check blood pressure -labs -? tdap -follow up in 3 months  Start the hydralazine and take  twice daily, monitor blood pressure and can increase to 3x daily if needed. Continue the 4 other blood pressure medications.  Work on a clean diet and avoid processed foods and eating out.  Keep the appointment with cardiology. No strenuous activity until evaluated.  Baby aspirin daily.  -We placed a referral for you as discussed to the nephrologist for the resistant hypertension and to the foot doctor. It usually takes about 1-2 weeks to process and schedule this referral. If you have not heard from [place] regarding this appointment in 2 weeks please contact our office.  We recommend the following healthy lifestyle measures: - eat a healthy whole foods diet consisting of regular small meals composed of vegetables, fruits, beans, nuts, seeds, healthy meats such as white chicken and fish and whole grains.  - avoid sweets, white starchy foods, fried foods, fast food, processed foods, sodas, red meet and other fattening foods.        Colin Benton R.

## 2015-09-04 NOTE — Progress Notes (Signed)
Pre visit review using our clinic review tool, if applicable. No additional management support is needed unless otherwise documented below in the visit note. 

## 2015-09-05 MED ORDER — ROSUVASTATIN CALCIUM 20 MG PO TABS: 20.0000 mg | ORAL_TABLET | Freq: Every day | ORAL | Status: DC

## 2015-09-05 MED ORDER — GLIPIZIDE 5 MG PO TABS: 5.0000 mg | ORAL_TABLET | Freq: Every day | ORAL | Status: DC

## 2015-09-05 NOTE — Addendum Note (Signed)
Addended by: Johnella MoloneyFUNDERBURK, JO A on: 09/05/2015 04:10 PM   Modules accepted: Orders

## 2015-09-05 NOTE — Addendum Note (Signed)
Addended by: Johnella MoloneyFUNDERBURK, JO A on: 09/05/2015 04:11 PM   Modules accepted: Orders

## 2015-09-11 ENCOUNTER — Encounter: Payer: BC Managed Care – PPO | Admitting: *Deleted

## 2015-09-11 ENCOUNTER — Ambulatory Visit (INDEPENDENT_AMBULATORY_CARE_PROVIDER_SITE_OTHER): Payer: BC Managed Care – PPO | Admitting: Family Medicine

## 2015-09-11 ENCOUNTER — Encounter: Payer: Self-pay | Admitting: Family Medicine

## 2015-09-11 VITALS — BP 140/80 | HR 88

## 2015-09-11 DIAGNOSIS — I10 Essential (primary) hypertension: Secondary | ICD-10-CM

## 2015-09-11 DIAGNOSIS — E785 Hyperlipidemia, unspecified: Secondary | ICD-10-CM | POA: Diagnosis not present

## 2015-09-11 NOTE — Progress Notes (Signed)
HPI:  Jenna Stokes is a pleasant 50 year old here for a follow-up regarding her blood pressure. She had an episode several weeks ago of uncontrolled hypertension and saw my colleague for some dyspnea on exertion. She has an appointment with the cardiologist next week. She has been taking all of her medications as prescribed and has been making some significant changes in her diet. She no longer is having headaches and reports she is feeling much better. We also made recent changes to her diabetes and cholesterol medications.  ROS: See pertinent positives and negatives per HPI.  Past Medical History  Diagnosis Date  . Hypertension   . Hyperlipidemia   . Morbid obesity ([place])   . DM type 2, uncontrolled, with renal complications ([place]) 2/87/6811    Past Surgical History  Procedure Laterality Date  . Tonsillectomy    . Abdominal hysterectomy    . Cesarean section      Family History  Problem Relation Age of Onset  . Heart disease Mother   . Diabetes Mother   . Hypertension Sister   . Diabetes Sister     Social History   Social History  . Marital Status: Single    Spouse Name: N/A  . Number of Children: N/A  . Years of Education: N/A   Social History Main Topics  . Smoking status: Never Smoker   . Smokeless tobacco: Never Used  . Alcohol Use: No  . Drug Use: No  . Sexual Activity: Not Asked   Other Topics Concern  . 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  albuterol (PROVENTIL HFA;VENTOLIN HFA) 108 (90 Base) MCG/ACT inhaler, Inhale 2 puffs into the lungs every 6 (six) hours as needed for wheezing or shortness of breath., Disp: 1 Inhaler, Rfl: 2 .  amLODipine (NORVASC) 10 MG tablet, Take 1 tablet (10 mg total) by mouth daily., Disp: 90 tablet, Rfl: 3 .  Blood Glucose  Monitoring Suppl (ONETOUCH VERIO FLEX SYSTEM) W/DEVICE KIT, 1 each by Does not apply route once., Disp: 1 kit, Rfl: 0 .  glipiZIDE (GLUCOTROL) 5 MG tablet, Take 1 tablet (5 mg total) by mouth daily before breakfast., Disp: 90 tablet, Rfl: 1 .  glucose blood (ONETOUCH VERIO) test strip, Use as instructed once a day, Disp: 100 each, Rfl: 1 .  hydrALAZINE (APRESOLINE) 25 MG tablet, Take 1 tablet (25 mg total) by mouth 2 (two) times daily., Disp: 60 tablet, Rfl: 0 .  hydrochlorothiazide (HYDRODIURIL) 25 MG tablet, Take 1 tablet (25 mg total) by mouth daily., Disp: 30 tablet, Rfl: 0 .  labetalol (NORMODYNE) 300 MG tablet, Take 1 tablet (300 mg total) by mouth 2 (two) times daily., Disp: 60 tablet, Rfl: 1 .  Lancets (ONETOUCH ULTRASOFT) lancets, Use as instructed, Disp: 100 each, Rfl: 1 .  lisinopril (PRINIVIL,ZESTRIL) 20 MG tablet, Take 1 tablet (20 mg total) by mouth daily., Disp: 90 tablet, Rfl: 3 .  metFORMIN (GLUCOPHAGE) 1000 MG tablet, TAKE 1 TABLET BY MOUTH TWICE DAILY, Disp: 60 tablet, Rfl: 0 .  rosuvastatin (CRESTOR) 20 MG tablet, Take 1 tablet (20 mg total) by mouth daily., Disp: 90 tablet, Rfl: 1  EXAM:  Filed Vitals:   09/11/15 1030  BP: 140/80  Pulse: 88    There is no weight on file to calculate BMI.  GENERAL: vitals reviewed and listed above, alert, oriented, appears well hydrated and in no acute distress  HEENT: atraumatic, conjunttiva clear, no obvious abnormalities on inspection of external nose and ears  NECK: no obvious masses on inspection  LUNGS: clear to auscultation bilaterally, no wheezes, rales or rhonchi, good air movement  CV: HRRR, no peripheral edema  MS: moves all extremities without noticeable abnormality  PSYCH: pleasant and cooperative, no obvious depression or anxiety  ASSESSMENT AND PLAN:  Discussed the following assessment and plan:  Essential hypertension  Hyperlipemia  Morbid obesity, unspecified obesity type ([place])  -Follow up with  cardiology as planned, 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 -Congratulated on lifestyle changes, encouraged to continue - follow-up in 3 months with recheck of hemoglobin A1c and lipids at that time -Continue current blood pressure medications, she has also been referred to a nephrologist for her blood pressure. -Patient advised to return or notify a doctor immediately if symptoms worsen or persist or new concerns arise.  There are no Patient Instructions on file for this visit.   Colin Benton R.

## 2015-09-14 ENCOUNTER — Other Ambulatory Visit (HOSPITAL_COMMUNITY): Payer: BC Managed Care – PPO

## 2015-09-19 ENCOUNTER — Ambulatory Visit (INDEPENDENT_AMBULATORY_CARE_PROVIDER_SITE_OTHER): Payer: BC Managed Care – PPO | Admitting: Cardiovascular Disease

## 2015-09-19 ENCOUNTER — Encounter: Payer: Self-pay | Admitting: Cardiovascular Disease

## 2015-09-19 ENCOUNTER — Encounter: Payer: Self-pay | Admitting: Nurse Practitioner

## 2015-09-19 VITALS — BP 164/76 | HR 68 | Ht 70.0 in | Wt 352.0 lb

## 2015-09-19 DIAGNOSIS — R0609 Other forms of dyspnea: Secondary | ICD-10-CM | POA: Diagnosis not present

## 2015-09-19 DIAGNOSIS — I10 Essential (primary) hypertension: Secondary | ICD-10-CM

## 2015-09-19 DIAGNOSIS — R0600 Dyspnea, unspecified: Secondary | ICD-10-CM

## 2015-09-19 MED ORDER — CARVEDILOL 25 MG PO TABS: 25.0000 mg | ORAL_TABLET | Freq: Two times a day (BID) | ORAL | Status: DC

## 2015-09-19 NOTE — Patient Instructions (Signed)
Medication Instructions:  STOP Labetolol START Carvedilol (Coreg) 25 mg twice daily - start with 1/2 tab for 3 days   Labwork: None Ordered   Testing/Procedures: Your physician has requested that you have an echocardiogram. Echocardiography is a painless test that uses sound waves to create images of your heart. It provides your doctor with information about the size and shape of your heart and how well your heart's chambers and valves are working. This procedure takes approximately one hour. There are no restrictions for this procedure.   Follow-Up: Your physician recommends that you schedule a follow-up appointment in: 3 months with Dr. Elease HashimotoNahser   If you need a refill on your cardiac medications before your next appointment, please call your pharmacy.   Thank you for choosing [facility]! Eligha BridegroomMichelle Swinyer, RN [phone]

## 2015-09-19 NOTE — Progress Notes (Signed)
Cardiology Office Note   Date:  09/19/2015   ID:  Jenna Stokes, DOB 1965-10-09, MRN [id number]  PCP:  Lucretia Kern., DO  Cardiologist:   Thayer Headings, MD   Chief Complaint  Patient presents with  . Shortness of Breath  . Obesity      History of Present Illness: Jenna Stokes is a 50 y.o. female who presents for evaluation of her shortness of breath . She has had some shortness of breath.  Has been seen by her medical doctor.  Thought she had reactive airways disease - was give steroids. Was scheduled for a stress echo - which was cancelled due to her HTN No CP .  Had a myoview in 2010 - inferior Defect with Dr. Doreatha Lew. A subsequent cardiac cath was completely normal  Strong family hx of HTN and DM  Current weight is 352.   Has lost 10 lbs over the past 2 weeks .   Is only taking Labetalol once a day   Past Medical History  Diagnosis Date  . Hypertension   . Hyperlipidemia   . Morbid obesity ([place])   . DM type 2, uncontrolled, with renal complications ([place]) 3/50/0938    Past Surgical History  Procedure Laterality Date  . Tonsillectomy    . Abdominal hysterectomy    . Cesarean section       Current Outpatient Prescriptions  Medication Sig Dispense Refill  . albuterol (PROVENTIL HFA;VENTOLIN HFA) 108 (90 Base) MCG/ACT inhaler Inhale 2 puffs into the lungs every 6 (six) hours as needed for wheezing or shortness of breath. 1 Inhaler 2  . amLODipine (NORVASC) 10 MG tablet Take 1 tablet (10 mg total) by mouth daily. 90 tablet 3  . Blood Glucose Monitoring Suppl (ONETOUCH VERIO FLEX SYSTEM) W/DEVICE KIT 1 each by Does not apply route once. 1 kit 0  . glipiZIDE (GLUCOTROL) 5 MG tablet Take 1 tablet (5 mg total) by mouth daily before breakfast. 90 tablet 1  . glucose blood (ONETOUCH VERIO) test strip Use as instructed once a day 100 each 1  . hydrALAZINE (APRESOLINE) 25 MG tablet Take 1 tablet (25 mg total) by mouth 2 (two) times daily. 60 tablet 0  .  hydrochlorothiazide (HYDRODIURIL) 25 MG tablet Take 1 tablet (25 mg total) by mouth daily. 30 tablet 0  . labetalol (NORMODYNE) 300 MG tablet Take 1 tablet (300 mg total) by mouth 2 (two) times daily. 60 tablet 1  . Lancets (ONETOUCH ULTRASOFT) lancets Use as instructed 100 each 1  . lisinopril (PRINIVIL,ZESTRIL) 20 MG tablet Take 1 tablet (20 mg total) by mouth daily. 90 tablet 3  . metFORMIN (GLUCOPHAGE) 1000 MG tablet TAKE 1 TABLET BY MOUTH TWICE DAILY 60 tablet 0  . rosuvastatin (CRESTOR) 20 MG tablet Take 1 tablet (20 mg total) by mouth daily. 90 tablet 1   No current facility-administered medications for this visit.    Allergies:   Penicillins    Social History:  The patient  reports that she has never smoked. She has never used smokeless tobacco. She reports that she does not drink alcohol or use illicit drugs.   Family History:  The patient's family history includes Diabetes in her mother and sister; Heart disease in her mother; Hypertension in her sister.    ROS:  Please see the history of present illness.    Review of Systems: Constitutional:  denies fever, chills, diaphoresis, appetite change and fatigue.  HEENT: denies photophobia, eye pain, redness, hearing loss,  ear pain, congestion, sore throat, rhinorrhea, sneezing, neck pain, neck stiffness and tinnitus.  Respiratory: admits to SOB, DOE,    Cardiovascular: denies chest pain, palpitations and leg swelling.  Gastrointestinal: denies nausea, vomiting, abdominal pain, diarrhea, constipation, blood in stool.  Genitourinary: denies dysuria, urgency, frequency, hematuria, flank pain and difficulty urinating.  Musculoskeletal: denies  myalgias, back pain, joint swelling, arthralgias and gait problem.   Skin: denies pallor, rash and wound.  Neurological: denies dizziness, seizures, syncope, weakness, light-headedness, numbness and headaches.   Hematological: denies adenopathy, easy bruising, personal or family bleeding history.   Psychiatric/ Behavioral: denies suicidal ideation, mood changes, confusion, nervousness, sleep disturbance and agitation.       All other systems are reviewed and negative.    PHYSICAL EXAM: VS:  BP 164/76 mmHg  Pulse 68  Ht 5' 10"  (1.778 m)  Wt 352 lb (159.666 kg)  BMI 50.51 kg/m2  SpO2 98%  LMP 08/10/2006 , BMI Body mass index is 50.51 kg/(m^2). GEN: Well nourished, well developed, in no acute distress HEENT: normal Neck: no JVD, carotid bruits, or masses Cardiac: RRR; no murmurs, rubs, or gallops,no edema  Respiratory:  clear to auscultation bilaterally, normal work of breathing GI: soft, nontender, nondistended, + BS MS: no deformity or atrophy Skin: warm and dry, no rash Neuro:  Strength and sensation are intact Psych: normal   EKG:  EKG is not ordered today. The ekg ordered today demonstrates    Recent Labs: 08/25/2015: BUN 13; Creat 0.79; Hemoglobin 13.3; Platelets 193.0; Potassium 3.1*; Sodium 138; TSH 0.79    Lipid Panel    Component Value Date/Time   CHOL 200 09/04/2015 0957   TRIG 144.0 09/04/2015 0957   HDL 41.90 09/04/2015 0957   CHOLHDL 5 09/04/2015 0957   VLDL 28.8 09/04/2015 0957   LDLCALC 129* 09/04/2015 0957      Wt Readings from Last 3 Encounters:  09/19/15 352 lb (159.666 kg)  09/04/15 361 lb 12.8 oz (164.111 kg)  09/01/15 364 lb 1.6 oz (165.155 kg)      Other studies Reviewed: Additional studies/ records that were reviewed today include: . Review of the above records demonstrates:    ASSESSMENT AND PLAN:  1.  DOE - I suspect this is due to her morbid obesity . Will get an echo. She was originally ordered a stress echocardiogram given her obesity this would not be feasible. I strongly advised her to continue with her weight loss efforts. Think this is the only good long-term solution.  2. Hypertension: Her blood pressure seems to be getting better. She has improved her diet over the last 2-3 weeks. She's been cut out salt. She has  a consult with a nutritionist in a week. She does not tolerate the labetalol and so she's only taking 300 mg once a day. We'll stop the labetalol and try carvedilol 25 mg twice a day. We'll have her take a half a tablet twice a day for the first day or 2.  3. Morbid obesity :  Encouraged weight loss.     Will see her in 3 months .    Current medicines are reviewed at length with the patient today.  The patient does not have concerns regarding medicines.  The following changes have been made:  no change  Labs/ tests ordered today include:  No orders of the defined types were placed in this encounter.     Disposition:   FU with me in 3 months      Rodel Glaspy, Arnette Norris  J, MD  09/19/2015 4:37 PM    [address], [place], [place]  [address] Phone: [phone]; Fax: [phone]   [facility]  [address] [place], [place]  [address] [phone]   Fax [phone]

## 2015-09-20 ENCOUNTER — Telehealth: Payer: Self-pay | Admitting: Family Medicine

## 2015-09-20 NOTE — Telephone Encounter (Signed)
Pt was seen by Kandee Keenory on 08/25/15 and need a note that says she was out of work on this day. She was diag with pneumonia. And put on steroids that elevated her bp

## 2015-09-20 NOTE — Telephone Encounter (Signed)
Letter printed and placed up front for pick up. Patient notified.  

## 2015-09-20 NOTE — Telephone Encounter (Signed)
That is fine 

## 2015-09-20 NOTE — Telephone Encounter (Signed)
Ok to write note for patient

## 2015-09-21 ENCOUNTER — Encounter: Payer: Self-pay | Admitting: *Deleted

## 2015-09-22 ENCOUNTER — Telehealth: Payer: Self-pay | Admitting: Family Medicine

## 2015-09-22 NOTE — Telephone Encounter (Signed)
I left a message for the pt to return my call. 

## 2015-09-22 NOTE — Telephone Encounter (Signed)
Pt would like joann to return her call concerning something personal and clothes

## 2015-09-25 ENCOUNTER — Ambulatory Visit: Payer: BC Managed Care – PPO | Admitting: Cardiovascular Disease

## 2015-09-25 ENCOUNTER — Other Ambulatory Visit (HOSPITAL_COMMUNITY): Payer: BC Managed Care – PPO

## 2015-09-25 NOTE — Telephone Encounter (Signed)
I left a message for the pt to return my call. 

## 2015-09-26 NOTE — Telephone Encounter (Signed)
4/24--I spoke with the pt and she stated she has disability paperwork to be completed by Dr Selena BattenKim.  I advised her to drop this off to have Dr Selena BattenKim review this and it usually takes about 5-7 business days to be completed and she agreed.

## 2015-09-29 ENCOUNTER — Ambulatory Visit: Payer: BC Managed Care – PPO | Admitting: Dietician

## 2015-10-03 ENCOUNTER — Other Ambulatory Visit: Payer: Self-pay

## 2015-10-03 ENCOUNTER — Ambulatory Visit (HOSPITAL_COMMUNITY): Payer: BC Managed Care – PPO | Attending: Internal Medicine

## 2015-10-03 DIAGNOSIS — R0600 Dyspnea, unspecified: Secondary | ICD-10-CM | POA: Diagnosis present

## 2015-10-03 DIAGNOSIS — R0609 Other forms of dyspnea: Secondary | ICD-10-CM | POA: Diagnosis not present

## 2015-10-03 DIAGNOSIS — Z6841 Body Mass Index (BMI) 40.0 and over, adult: Secondary | ICD-10-CM | POA: Diagnosis not present

## 2015-10-03 DIAGNOSIS — E119 Type 2 diabetes mellitus without complications: Secondary | ICD-10-CM | POA: Diagnosis not present

## 2015-10-03 DIAGNOSIS — I340 Nonrheumatic mitral (valve) insufficiency: Secondary | ICD-10-CM | POA: Diagnosis not present

## 2015-10-03 DIAGNOSIS — E785 Hyperlipidemia, unspecified: Secondary | ICD-10-CM | POA: Diagnosis not present

## 2015-10-03 DIAGNOSIS — I119 Hypertensive heart disease without heart failure: Secondary | ICD-10-CM | POA: Insufficient documentation

## 2015-10-04 ENCOUNTER — Telehealth: Payer: Self-pay | Admitting: Nurse Practitioner

## 2015-10-04 DIAGNOSIS — I517 Cardiomegaly: Secondary | ICD-10-CM

## 2015-10-04 NOTE — Telephone Encounter (Signed)
See result note for echocardiogram Order for cardiac MRI in epic Patient aware that someone will call her to schedule

## 2015-10-04 NOTE — Telephone Encounter (Signed)
-----   Message from Vesta MixerPhilip J Nahser, MD sent at 10/03/2015  6:25 PM EDT ----- Significant LVH.   May be just due to her obesity and BP. Could be due to hypertrophic cardiomyopathy Please order a cardiac MRI for further evaluation

## 2015-10-17 ENCOUNTER — Telehealth: Payer: Self-pay | Admitting: Nurse Practitioner

## 2015-10-17 NOTE — Telephone Encounter (Signed)
Spoke with patient and advised that she exceeds weight limit for MRI machine.  Dr. Elease HashimotoNahser has advised that she continue to work on weight loss and exercise and to call if she has worsening SOB or other concerns.  She is scheduled for follow-up with Dr. Elease HashimotoNahser July 19 and I advised we can see her sooner if needed.  She verbalized understanding and agreement with plan.

## 2015-12-07 ENCOUNTER — Other Ambulatory Visit: Payer: BC Managed Care – PPO

## 2015-12-08 ENCOUNTER — Other Ambulatory Visit: Payer: BC Managed Care – PPO

## 2015-12-12 ENCOUNTER — Ambulatory Visit: Payer: BC Managed Care – PPO | Admitting: Family Medicine

## 2015-12-20 ENCOUNTER — Ambulatory Visit: Payer: BC Managed Care – PPO | Admitting: Cardiovascular Disease

## 2015-12-21 ENCOUNTER — Other Ambulatory Visit: Payer: BC Managed Care – PPO

## 2015-12-29 ENCOUNTER — Ambulatory Visit: Payer: BC Managed Care – PPO | Admitting: Family Medicine

## 2016-01-04 ENCOUNTER — Telehealth: Payer: Self-pay | Admitting: Family Medicine

## 2016-01-04 NOTE — Telephone Encounter (Signed)
Pt cancelled referral appointment with [facility]. They called to advise pt has declined 2 times to reschedule, Pt declined to make another appointment and ask [facility] to call back in a month. They will do this, but wanted Dr Selena Batten to know.

## 2016-02-20 ENCOUNTER — Other Ambulatory Visit: Payer: BC Managed Care – PPO

## 2016-02-27 ENCOUNTER — Ambulatory Visit: Payer: BC Managed Care – PPO | Admitting: Family Medicine

## 2016-04-04 NOTE — Progress Notes (Signed)
HPI:  Jenna Stokes is a 50 yo with a past medical history significant for morbid obesity, hypertension, hyperlipidemia and diabetes here for follow-up. She had an evaluation with cardiology earlier this year for dyspnea on exertion, which per cardiology notes is felt to likely be secondary to her morbid obesity. She had a hypertensive urgency and difficulty with controlling her blood pressure earlier this year and was referred to nephrology, however she did not go to the appointment. Her current prescribed blood pressure medications include amlodipine 10 mg, Coreg 25 mg, hydralazine 25 mg, hydrochlorothiazide 25 mg and lisinopril 20 mg - however it seems that she may not be taking the hydralazine or the hydrochlorothiazide and she did not take any of her blood pressure medications this morning. She is prescribed metformin 1000 mg twice a day and glipizide 5 mg daily for her diabetes but it seems she may not be taking the glipizide when we reviewed her medication list. She  Cr is supposed to take Crestor 20 mg for her cholesterol - but she thinks she is taking Lipitor instead. She is due for a Pap smear, flu shot, mammogram and colon cancer screening. She is also due for her labs.  She reports she feels great. She reports her blood pressure has been normal at home in the 130-140/70 range. She did not see the kidney specialist that she did not feel she needed to. He denies chest pain, shortness of breath, dyspnea on exertion, swelling, palpitations or headaches. She reports she has become much more active and is getting about 7000 steps per day. She also has tried to improve her diet, but reports she does eat poorly on occasion. She has a form that she is leaving first completed for work.  ROS: See pertinent positives and negatives per HPI.  Past Medical History:  Diagnosis Date  . 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 Hyperlipidemia   . Hypertension   . Morbid obesity  (HCC)     Past Surgical History:  Procedure Laterality Date  . ABDOMINAL HYSTERECTOMY    . CESAREAN SECTION    . TONSILLECTOMY      Family History  Problem Relation Age of Onset  . Heart disease Mother   . Diabetes Mother   . Hypertension Sister   . Diabetes Sister     Social History   Social History  . Marital status: Single    Spouse name: N/A  . Number of children: N/A  . Years of education: N/A   Social History Main Topics  . Smoking status: Never Smoker  . Smokeless tobacco: Never Used  . Alcohol use No  . Drug use: No  . Sexual activity: Not Asked   Other Topics Concern  . 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  amLODipine (NORVASC) 10 MG tablet, Take 1 tablet (10 mg total) by mouth daily., Disp: 90 tablet, Rfl: 3 .  carvedilol (COREG) 25 MG tablet, Take 1 tablet (25 mg total) by mouth 2 (two) times daily., Disp: 60 tablet, Rfl: 11 .  hydrALAZINE (APRESOLINE) 25 MG tablet, Take 1 tablet (25 mg total) by mouth 2 (two) times daily., Disp:  60 tablet, Rfl: 0 .  hydrochlorothiazide (HYDRODIURIL) 25 MG tablet, Take 1 tablet (25 mg total) by mouth daily., Disp: 90 tablet, Rfl: 3 .  lisinopril (PRINIVIL,ZESTRIL) 20 MG tablet, Take 1 tablet (20 mg total) by mouth daily., Disp: 90 tablet, Rfl: 3 .  metFORMIN (GLUCOPHAGE) 1000 MG tablet, TAKE 1 TABLET BY MOUTH TWICE DAILY, Disp: 60 tablet, Rfl: 0 .  glipiZIDE (GLUCOTROL) 5 MG tablet, Take 1 tablet (5 mg total) by mouth 2 (two) times daily before a meal., Disp: 120 tablet, Rfl: 3 .  rosuvastatin (CRESTOR) 20 MG tablet, Take 1 tablet (20 mg total) by mouth daily., Disp: 90 tablet, Rfl: 3  EXAM:  Vitals:   04/05/16 0950  BP: (!) 148/94  Pulse: 64  Temp: 98.5 F (36.9 C)    Body mass index is 50.56 kg/m.  GENERAL: vitals  reviewed and listed above, alert, oriented, appears well hydrated and in no acute distress  HEENT: atraumatic, conjunttiva clear, no obvious abnormalities on inspection of external nose and ears  NECK: no obvious masses on inspection  LUNGS: clear to auscultation bilaterally, no wheezes, rales or rhonchi, good air movement  CV: HRRR, no peripheral edema  MS: moves all extremities without noticeable abnormality  PSYCH: pleasant and cooperative, no obvious depression or anxiety  ASSESSMENT AND PLAN:  Discussed the following assessment and plan:  Essential hypertension - Plan: Basic metabolic panel, CBC (no diff)  Hyperlipidemia, unspecified hyperlipidemia type - Plan: Lipid Panel  Morbid obesity (HCC)  Type 2 diabetes mellitus without complication, without long-term current use of insulin (HCC) - Plan: Hemoglobin A1c  -Blood pressure is elevated, but she did not take her medicines and seems to be missing some of her medication doses  - stressed the importance of daily adherence to her medication regimen and send refills and reviewed her medication list in detail  -Nurse visit in 1 week to recheck her pressure -Labs today  -Reviewed and advised all health maintenance measures to  -Lifestyle recommendations  -Patient advised to return or notify a doctor immediately if symptoms worsen or persist or new concerns arise.  Patient Instructions  BEFORE YOU LEAVE: -flu shot -labs -order cologuard -information for pt to schedule mammogram -follow up: 1) Nurse visit to recheck blood pressure next week 2)follow up with Dr. Selena Batten in 3 months  REVIEW THE MEDICATION LIST CAREFULLY and ensure taking all medications as prescribed starting today. Take all blood pressure medications several hours prior to your blood pressure check.  Call the breast center and your gynecologist today to schedule your mammogram and gynecological exams.  We ordered the cologuard test for colon cancer screening.  Please complete this test.  We have ordered labs or studies at this visit. It can take up to 1-2 weeks for results and processing. IF results require follow up or explanation, we will call you with instructions. Clinically stable results will be released to your [facility]. If you have not heard from [place] or cannot find your results in [facility] in 2 weeks please contact our office at [phone].  If you are not yet signed up for [facility], please consider signing up.    We recommend the following healthy lifestyle for LIFE: 1) Small portions.   Tip: eat off of a salad plate instead of a dinner plate.  Tip: It is ok to feel hungry after a meal - that likely means you ate an appropriate portion.  Tip: if you need more or a snack choose fruits, veggies and/or a handful  of nuts or seeds.  2) Eat a healthy clean diet.  * Tip: Avoid (less then 1 serving per week): processed foods, sweets, sweetened drinks, white starches (rice, flour, bread, potatoes, pasta, etc), red meat, fast foods, butter  *Tip: CHOOSE instead   * 5-9 servings per day of fresh or frozen fruits and vegetables (but not corn, potatoes, bananas, canned or dried fruit)   *nuts and seeds, beans   *olives and olive oil   *small portions of lean meats such as fish and white chicken    *small portions of whole grains  3)Get at least 150 minutes of sweaty aerobic exercise per week.  4)Reduce stress - consider counseling, meditation and relaxation to balance other aspects of your life.         Kriste BasqueKIM, Corrinne Benegas R., DO

## 2016-04-05 ENCOUNTER — Encounter: Payer: Self-pay | Admitting: Family Medicine

## 2016-04-05 ENCOUNTER — Ambulatory Visit (INDEPENDENT_AMBULATORY_CARE_PROVIDER_SITE_OTHER): Payer: BC Managed Care – PPO | Admitting: Family Medicine

## 2016-04-05 VITALS — BP 148/94 | HR 64 | Temp 98.5°F | Ht 70.0 in | Wt 352.4 lb

## 2016-04-05 DIAGNOSIS — E785 Hyperlipidemia, unspecified: Secondary | ICD-10-CM | POA: Diagnosis not present

## 2016-04-05 DIAGNOSIS — E119 Type 2 diabetes mellitus without complications: Secondary | ICD-10-CM | POA: Diagnosis not present

## 2016-04-05 DIAGNOSIS — I10 Essential (primary) hypertension: Secondary | ICD-10-CM

## 2016-04-05 DIAGNOSIS — Z23 Encounter for immunization: Secondary | ICD-10-CM

## 2016-04-05 LAB — CBC
HEMATOCRIT: 38.5 % (ref 36.0–46.0)
HEMOGLOBIN: 12.9 g/dL (ref 12.0–15.0)
MCHC: 33.5 g/dL (ref 30.0–36.0)
MCV: 88.4 fl (ref 78.0–100.0)
Platelets: 246 10*3/uL (ref 150.0–400.0)
RBC: 4.36 Mil/uL (ref 3.87–5.11)
RDW: 13.4 % (ref 11.5–15.5)
WBC: 8.3 10*3/uL (ref 4.0–10.5)

## 2016-04-05 LAB — BASIC METABOLIC PANEL
BUN: 13 mg/dL (ref 6–23)
CO2: 30 mEq/L (ref 19–32)
Calcium: 9.9 mg/dL (ref 8.4–10.5)
Chloride: 103 mEq/L (ref 96–112)
Creatinine, Ser: 0.77 mg/dL (ref 0.40–1.20)
GFR: 102 mL/min (ref >60.00)
Glucose, Bld: 127 mg/dL — ABNORMAL HIGH (ref 70–99)
POTASSIUM: 3.9 meq/L (ref 3.5–5.1)
SODIUM: 140 meq/L (ref 135–145)

## 2016-04-05 LAB — HEMOGLOBIN A1C: Hgb A1c MFr Bld: 7 % — ABNORMAL HIGH (ref 4.6–6.5)

## 2016-04-05 LAB — LIPID PANEL
CHOLESTEROL: 197 mg/dL (ref 0–200)
HDL: 51.6 mg/dL (ref >39.00)
LDL Cholesterol: 122 mg/dL — ABNORMAL HIGH (ref 0–99)
NonHDL: 145.04
Total CHOL/HDL Ratio: 4
Triglycerides: 117 mg/dL (ref 0.0–149.0)
VLDL: 23.4 mg/dL (ref 0.0–40.0)

## 2016-04-05 MED ORDER — AMLODIPINE BESYLATE 10 MG PO TABS: 10.0000 mg | ORAL_TABLET | Freq: Every day | ORAL | 3 refills | Status: DC

## 2016-04-05 MED ORDER — GLIPIZIDE 5 MG PO TABS: 5.0000 mg | ORAL_TABLET | Freq: Two times a day (BID) | ORAL | 3 refills | Status: DC

## 2016-04-05 MED ORDER — ROSUVASTATIN CALCIUM 20 MG PO TABS: 20.0000 mg | ORAL_TABLET | Freq: Every day | ORAL | 3 refills | Status: DC

## 2016-04-05 MED ORDER — LISINOPRIL 20 MG PO TABS: 20.0000 mg | ORAL_TABLET | Freq: Every day | ORAL | 3 refills | Status: DC

## 2016-04-05 MED ORDER — HYDROCHLOROTHIAZIDE 25 MG PO TABS: 25.0000 mg | ORAL_TABLET | Freq: Every day | ORAL | 3 refills | Status: DC

## 2016-04-05 MED ORDER — HYDRALAZINE HCL 25 MG PO TABS: 25.0000 mg | ORAL_TABLET | Freq: Two times a day (BID) | ORAL | 0 refills | Status: DC

## 2016-04-05 NOTE — Patient Instructions (Signed)
BEFORE YOU LEAVE: -flu shot -labs -order cologuard -information for pt to schedule mammogram -follow up: 1) Nurse visit to recheck blood pressure next week 2)follow up with Dr. Selena BattenKim in 3 months  REVIEW THE MEDICATION LIST CAREFULLY and ensure taking all medications as prescribed starting today. Take all blood pressure medications several hours prior to your blood pressure check.  Call the breast center and your gynecologist today to schedule your mammogram and gynecological exams.  We ordered the cologuard test for colon cancer screening. Please complete this test.  We have ordered labs or studies at this visit. It can take up to 1-2 weeks for results and processing. IF results require follow up or explanation, we will call you with instructions. Clinically stable results will be released to your [facility]. If you have not heard from us or cannot find your results in [facility] in 2 weeks please contact our office at [phone].  If you are not yet signed up for [facility], please consider signing up.    We recommend the following healthy lifestyle for LIFE: 1) Small portions.   Tip: eat off of a salad plate instead of a dinner plate.  Tip: It is ok to feel hungry after a meal - that likely means you ate an appropriate portion.  Tip: if you need more or a snack choose fruits, veggies and/or a handful of nuts or seeds.  2) Eat a healthy clean diet.  * Tip: Avoid (less then 1 serving per week): processed foods, sweets, sweetened drinks, white starches (rice, flour, bread, potatoes, pasta, etc), red meat, fast foods, butter  *Tip: CHOOSE instead   * 5-9 servings per day of fresh or frozen fruits and vegetables (but not corn, potatoes, bananas, canned or dried fruit)   *nuts and seeds, beans   *olives and olive oil   *small portions of lean meats such as fish and white chicken    *small portions of whole grains  3)Get at least 150 minutes of sweaty aerobic exercise per week.  4)Reduce  stress - consider counseling, meditation and relaxation to balance other aspects of your life.

## 2016-04-05 NOTE — Progress Notes (Signed)
Pre visit review using our clinic review tool, if applicable. No additional management support is needed unless otherwise documented below in the visit note. 

## 2016-04-12 ENCOUNTER — Ambulatory Visit (INDEPENDENT_AMBULATORY_CARE_PROVIDER_SITE_OTHER): Payer: BC Managed Care – PPO | Admitting: Family Medicine

## 2016-04-12 ENCOUNTER — Encounter: Payer: Self-pay | Admitting: Family Medicine

## 2016-04-12 VITALS — BP 152/68 | HR 76 | Temp 98.0°F | Ht 70.0 in | Wt 354.2 lb

## 2016-04-12 DIAGNOSIS — I10 Essential (primary) hypertension: Secondary | ICD-10-CM | POA: Diagnosis not present

## 2016-04-12 DIAGNOSIS — E119 Type 2 diabetes mellitus without complications: Secondary | ICD-10-CM

## 2016-04-12 DIAGNOSIS — E785 Hyperlipidemia, unspecified: Secondary | ICD-10-CM | POA: Diagnosis not present

## 2016-04-12 DIAGNOSIS — E1165 Type 2 diabetes mellitus with hyperglycemia: Secondary | ICD-10-CM | POA: Insufficient documentation

## 2016-04-12 DIAGNOSIS — E1100 Type 2 diabetes mellitus with hyperosmolarity without nonketotic hyperglycemic-hyperosmolar coma (NKHHC): Secondary | ICD-10-CM | POA: Insufficient documentation

## 2016-04-12 DIAGNOSIS — IMO0002 Reserved for concepts with insufficient information to code with codable children: Secondary | ICD-10-CM | POA: Insufficient documentation

## 2016-04-12 MED ORDER — CARVEDILOL 6.25 MG PO TABS: 6.2500 mg | ORAL_TABLET | Freq: Two times a day (BID) | ORAL | 3 refills | Status: DC

## 2016-04-12 NOTE — Patient Instructions (Addendum)
BEFORE YOU LEAVE: -follow up: Dr. Selena BattenKim on Tuesday  Go to the pharmacy right now and buy pill box and make sure you have active all prescriptions on the list we gave you and that you have the blood pressure medication Coreg (carvedilol). Coreg is meant to take twice daily.  Also make sure that you have the glipizide.  When you get home check all of your medications againts the list to make sure you are take all of them:  5 medications for blood pressure: norvasc 10mg  Carvedilol (coreg) 6.25 mg TWICE DAILY Hydralazine 25 mg twice daily Hydrochlorothiazide 25 mg lisinopril 20mg   2 medications for Diabetes: - Meformin 1000mg  twice daily -glipizide 5 mg twice daily  1 medication for cholesterol: -crestor

## 2016-04-12 NOTE — Progress Notes (Signed)
Pre visit review using our clinic review tool, if applicable. No additional management support is needed unless otherwise documented below in the visit note. 

## 2016-04-12 NOTE — Progress Notes (Signed)
HPI:  Acute visit for HTN: -see prior note -BP still up at nurse check -reports she feels fine and BP in 130-140s/70-80s at home -on review of med list - still not taking several meds including her coreg and glipized -she says pharmacy did not have them, but she also was not aware not taking despite careful instructions at last visit to ensure taking all medications -apparently she actually isn't take any of her bid meds twice daily -denies cp, sob, doe, ha  ROS: See pertinent positives and negatives per HPI.  Past Medical History:  Diagnosis Date  . 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 Hyperlipidemia   . Hypertension   . Morbid obesity (HCC)     Past Surgical History:  Procedure Laterality Date  . ABDOMINAL HYSTERECTOMY    . CESAREAN SECTION    . TONSILLECTOMY      Family History  Problem Relation Age of Onset  . Heart disease Mother   . Diabetes Mother   . Hypertension Sister   . Diabetes Sister     Social History   Social History  . Marital status: Single    Spouse name: N/A  . Number of children: N/A  . Years of education: N/A   Social History Main Topics  . Smoking status: Never Smoker  . Smokeless tobacco: Never Used  . Alcohol use No  . Drug use: No  . Sexual activity: Not Asked   Other Topics Concern  . 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  amLODipine (NORVASC) 10 MG tablet, Take 1 tablet (10 mg total) by mouth daily., Disp: 90 tablet, Rfl: 3 .  glipiZIDE (GLUCOTROL) 5 MG tablet, Take 1 tablet (5 mg total) by mouth 2 (two) times daily before a meal., Disp: 120 tablet, Rfl: 3 .  hydrALAZINE (APRESOLINE) 25 MG tablet, Take 1 tablet (25 mg total) by mouth 2 (two) times daily., Disp: 60 tablet, Rfl: 0 .  hydrochlorothiazide  (HYDRODIURIL) 25 MG tablet, Take 1 tablet (25 mg total) by mouth daily., Disp: 90 tablet, Rfl: 3 .  lisinopril (PRINIVIL,ZESTRIL) 20 MG tablet, Take 1 tablet (20 mg total) by mouth daily., Disp: 90 tablet, Rfl: 3 .  metFORMIN (GLUCOPHAGE) 1000 MG tablet, TAKE 1 TABLET BY MOUTH TWICE DAILY, Disp: 60 tablet, Rfl: 0 .  rosuvastatin (CRESTOR) 20 MG tablet, Take 1 tablet (20 mg total) by mouth daily., Disp: 90 tablet, Rfl: 3 .  carvedilol (COREG) 6.25 MG tablet, Take 1 tablet (6.25 mg total) by mouth 2 (two) times daily with a meal., Disp: 120 tablet, Rfl: 3  EXAM:  Vitals:   04/12/16 1528  BP: (!) 152/68  Pulse: 76  Temp: 98 F (36.7 C)    Body mass index is 50.82 kg/m.  GENERAL: vitals reviewed and listed above, alert, oriented, appears well hydrated and in no acute distress  HEENT: atraumatic, conjunttiva clear, no obvious abnormalities on inspection of external nose and ears  NECK: no obvious masses on inspection  LUNGS: clear to auscultation bilaterally, no wheezes, rales or rhonchi, good air movement  CV: HRRR, no peripheral edema  MS: moves all extremities without noticeable abnormality  PSYCH: pleasant  and cooperative, no obvious depression or anxiety  ASSESSMENT AND PLAN:  Discussed the following assessment and plan: More than 50% of over 25 minutes spent in total in caring for this patient was spent face-to-face with the patient, counseling and/or coordinating care.   Essential hypertension  Type 2 diabetes mellitus without complication, without long-term current use of insulin (HCC)  Hyperlipidemia, unspecified hyperlipidemia type  -reviewed each medication in writing and verbally and privded updated med list with new lower dose BB since re-initiating -advised she go straight to the pharmacy and ensure has every medication on list -then advise am/pm pill box at home and that she fill from list each week to ensure not missing any meds -recheck next week as she has  to have BP controlled for driving form for work -Patient advised to return or notify a doctor immediately if symptoms worsen or persist or new concerns arise.  Patient Instructions  BEFORE YOU LEAVE: -follow up: Dr. Selena BattenKim on Tuesday  Go to the pharmacy right now and buy pill box and make sure you have active all prescriptions on the list we gave you and that you have the blood pressure medication Coreg (carvedilol). Coreg is meant to take twice daily.  Also make sure that you have the glipizide.  When you get home check all of your medications againts the list to make sure you are take all of them:  5 medications for blood pressure: norvasc 10mg  Carvedilol (coreg) 6.25 mg TWICE DAILY Hydralazine 25 mg twice daily Hydrochlorothiazide 25 mg lisinopril 20mg   2 medications for Diabetes: - Meformin 1000mg  twice daily -glipizide 5 mg twice daily  1 medication for cholesterol: -crestor       Kriste BasqueKIM, Ashwika Freels R., DO

## 2016-04-16 ENCOUNTER — Ambulatory Visit (INDEPENDENT_AMBULATORY_CARE_PROVIDER_SITE_OTHER): Payer: BC Managed Care – PPO | Admitting: Family Medicine

## 2016-04-16 ENCOUNTER — Encounter: Payer: Self-pay | Admitting: Family Medicine

## 2016-04-16 VITALS — BP 128/76 | HR 88 | Temp 99.0°F

## 2016-04-16 DIAGNOSIS — I10 Essential (primary) hypertension: Secondary | ICD-10-CM | POA: Diagnosis not present

## 2016-04-16 NOTE — Patient Instructions (Signed)
Keep follow up as scheduled. Please always arrive 15 minutes prior to your appointment with Dr. Selena BattenKim for check in and for the pre-visit.  Continue all of your medications.   We recommend the following healthy lifestyle for LIFE: 1) Small portions.   Tip: eat off of a salad plate instead of a dinner plate.  Tip: if you need more or a snack choose fruits, veggies and/or a handful of nuts or seeds.  2) Eat a healthy clean diet.  * Tip: Avoid (less then 1 serving per week): processed foods, sweets, sweetened drinks, white starches (rice, flour, bread, potatoes, pasta, etc), red meat, fast foods, butter  *Tip: CHOOSE instead   * 5-9 servings per day of fresh or frozen fruits and vegetables (but not corn, potatoes, bananas, canned or dried fruit)   *nuts and seeds, beans   *olives and olive oil   *small portions of lean meats such as fish and white chicken    *small portions of whole grains  3)Get at least 150 minutes of sweaty aerobic exercise per week.  4)Reduce stress - consider counseling, meditation and relaxation to balance other aspects of your life.

## 2016-04-16 NOTE — Progress Notes (Signed)
HPI:  Jenna Stokes is here for follow-up regarding her blood pressure. . She had some misunderstandings regarding her medications and was not taking all her blood pressure medications at her last visit. She reports since her last visit she has carefully used her med list to check prescriptions at home and is starting to use a pillbox per our instructions. She reports she been taking all of her medicines on her list every day. Denies chest pain, shortness of breath, swelling, headaches.  ROS: See pertinent positives and negatives per HPI.  Past Medical History:  Diagnosis Date  . 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 Hyperlipidemia   . Hypertension   . Morbid obesity (HCC)     Past Surgical History:  Procedure Laterality Date  . ABDOMINAL HYSTERECTOMY    . CESAREAN SECTION    . TONSILLECTOMY      Family History  Problem Relation Age of Onset  . Heart disease Mother   . Diabetes Mother   . Hypertension Sister   . Diabetes Sister     Social History   Social History  . Marital status: Single    Spouse name: N/A  . Number of children: N/A  . Years of education: N/A   Social History Main Topics  . Smoking status: Never Smoker  . Smokeless tobacco: Never Used  . Alcohol use No  . Drug use: No  . Sexual activity: Not Asked   Other Topics Concern  . 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  amLODipine (NORVASC) 10 MG tablet, Take 1 tablet (10 mg total) by mouth daily., Disp: 90 tablet, Rfl: 3 .  carvedilol (COREG) 6.25 MG tablet, Take 1 tablet (6.25 mg total) by mouth 2 (two) times daily with a meal., Disp: 120 tablet, Rfl: 3 .  glipiZIDE (GLUCOTROL) 5 MG tablet, Take 1 tablet (5 mg total) by mouth 2 (two) times daily before a meal., Disp: 120  tablet, Rfl: 3 .  hydrALAZINE (APRESOLINE) 25 MG tablet, Take 1 tablet (25 mg total) by mouth 2 (two) times daily., Disp: 60 tablet, Rfl: 0 .  hydrochlorothiazide (HYDRODIURIL) 25 MG tablet, Take 1 tablet (25 mg total) by mouth daily., Disp: 90 tablet, Rfl: 3 .  lisinopril (PRINIVIL,ZESTRIL) 20 MG tablet, Take 1 tablet (20 mg total) by mouth daily., Disp: 90 tablet, Rfl: 3 .  metFORMIN (GLUCOPHAGE) 1000 MG tablet, TAKE 1 TABLET BY MOUTH TWICE DAILY, Disp: 60 tablet, Rfl: 0 .  rosuvastatin (CRESTOR) 20 MG tablet, Take 1 tablet (20 mg total) by mouth daily., Disp: 90 tablet, Rfl: 3  EXAM:  Vitals:   04/16/16 1131  BP: 128/76  Pulse: 88  Temp: 99 F (37.2 C)    There is no height or weight on file to calculate BMI.  GENERAL: vitals reviewed and listed above, alert, oriented, appears well hydrated and in no acute distress  HEENT: atraumatic, conjunttiva clear, no obvious abnormalities on inspection of external nose and ears  NECK: no obvious masses on inspection  LUNGS: clear to auscultation bilaterally, no wheezes, rales or rhonchi, good air movement  CV: HRRR, no peripheral edema  MS: moves all extremities without noticeable  abnormality  PSYCH: pleasant and cooperative, no obvious depression or anxiety  ASSESSMENT AND PLAN:  Discussed the following assessment and plan:  Essential hypertension  -Blood pressure much better today -Advised strict persistent adherence to her medication list and advised that she call if she ever feels that she is missing one of her medicines or has any questions -Advised she continue to use a pillbox and alarm on her apple watch -continue advised healthy lifestyle -Warm completed and returned patient, copied, scanned -Patient advised to return or notify a doctor immediately if symptoms worsen or persist or new concerns arise.  Patient Instructions  Keep follow up as scheduled. Please always arrive 15 minutes prior to your appointment with Dr.  Selena BattenKim for check in and for the pre-visit.  Continue all of your medications.   We recommend the following healthy lifestyle for LIFE: 1) Small portions.   Tip: eat off of a salad plate instead of a dinner plate.  Tip: if you need more or a snack choose fruits, veggies and/or a handful of nuts or seeds.  2) Eat a healthy clean diet.  * Tip: Avoid (less then 1 serving per week): processed foods, sweets, sweetened drinks, white starches (rice, flour, bread, potatoes, pasta, etc), red meat, fast foods, butter  *Tip: CHOOSE instead   * 5-9 servings per day of fresh or frozen fruits and vegetables (but not corn, potatoes, bananas, canned or dried fruit)   *nuts and seeds, beans   *olives and olive oil   *small portions of lean meats such as fish and white chicken    *small portions of whole grains  3)Get at least 150 minutes of sweaty aerobic exercise per week.  4)Reduce stress - consider counseling, meditation and relaxation to balance other aspects of your life.    Kriste BasqueKIM, HANNAH R., DO

## 2016-04-16 NOTE — Progress Notes (Signed)
Pre visit review using our clinic review tool, if applicable. No additional management support is needed unless otherwise documented below in the visit note. 

## 2016-07-08 ENCOUNTER — Ambulatory Visit: Payer: BC Managed Care – PPO | Admitting: Family Medicine

## 2016-07-11 ENCOUNTER — Ambulatory Visit (INDEPENDENT_AMBULATORY_CARE_PROVIDER_SITE_OTHER): Payer: BC Managed Care – PPO | Admitting: Family Medicine

## 2016-07-11 ENCOUNTER — Ambulatory Visit (INDEPENDENT_AMBULATORY_CARE_PROVIDER_SITE_OTHER)
Admission: RE | Admit: 2016-07-11 | Discharge: 2016-07-11 | Disposition: A | Discharge status: Home / Self Care | Payer: BC Managed Care – PPO | Source: Ambulatory Visit | Attending: Family Medicine | Admitting: Family Medicine

## 2016-07-11 VITALS — BP 150/92 | HR 72 | Temp 98.7°F | Ht 70.0 in | Wt 361.5 lb

## 2016-07-11 DIAGNOSIS — J988 Other specified respiratory disorders: Secondary | ICD-10-CM | POA: Diagnosis not present

## 2016-07-11 DIAGNOSIS — K1379 Other lesions of oral mucosa: Secondary | ICD-10-CM

## 2016-07-11 DIAGNOSIS — K047 Periapical abscess without sinus: Secondary | ICD-10-CM | POA: Diagnosis not present

## 2016-07-11 LAB — POC INFLUENZA A&B (BINAX/QUICKVUE)
Influenza A, POC: NEGATIVE
Influenza B, POC: NEGATIVE

## 2016-07-11 MED ORDER — CARVEDILOL 6.25 MG PO TABS: 6.2500 mg | ORAL_TABLET | Freq: Two times a day (BID) | ORAL | 1 refills | Status: DC

## 2016-07-11 MED ORDER — HYDRALAZINE HCL 25 MG PO TABS: 25.0000 mg | ORAL_TABLET | Freq: Two times a day (BID) | ORAL | 1 refills | Status: DC

## 2016-07-11 MED ORDER — AMLODIPINE BESYLATE 10 MG PO TABS
10.0000 mg | ORAL_TABLET | Freq: Every day | ORAL | 1 refills | Status: DC
Start: 2016-07-11 — End: 2017-07-01

## 2016-07-11 MED ORDER — LISINOPRIL 20 MG PO TABS
20.0000 mg | ORAL_TABLET | Freq: Every day | ORAL | 1 refills | Status: DC
Start: 2016-07-11 — End: 2017-03-31

## 2016-07-11 MED ORDER — HYDROCHLOROTHIAZIDE 25 MG PO TABS
25.0000 mg | ORAL_TABLET | Freq: Every day | ORAL | 1 refills | Status: DC
Start: 2016-07-11 — End: 2017-07-01

## 2016-07-11 MED ORDER — BENZONATATE 100 MG PO CAPS: 100.0000 mg | ORAL_CAPSULE | Freq: Three times a day (TID) | ORAL | 0 refills | Status: DC | PRN

## 2016-07-11 NOTE — Progress Notes (Signed)
Pre visit review using our clinic review tool, if applicable. No additional management support is needed unless otherwise documented below in the visit note. 

## 2016-07-11 NOTE — Patient Instructions (Addendum)
BEFORE YOU LEAVE: -flu shot -xray sheet -refill all blood pressure medications - 90 days with 1 refill -follow up in 1 month  See your dentist about the tooth and any further bleeding.  Seek care immediately if vomiting, blood, coughing up blood, worsening, trouble breathing or other concerns   INSTRUCTIONS FOR UPPER RESPIRATORY INFECTION:  -plenty of rest and fluids  -nasal saline wash 2-3 times daily (use prepackaged nasal saline or bottled/distilled water if making your own)   -can use AFRIN nasal spray for drainage and nasal congestion - but do NOT use longer then 3-4 days  -can use tylenol (in no history of liver disease) or ibuprofen (if no history of kidney disease, bowel bleeding or significant heart disease) as directed for aches and sorethroat  -in the winter time, using a humidifier at night is helpful (please follow cleaning instructions)  -if you are taking a cough medication - use only as directed, may also try a teaspoon of honey to coat the throat and throat lozenges. II sent tessalon for cough.  -for sore throat, salt water gargles can help  -follow up if you have fevers, facial pain, tooth pain, difficulty breathing or are worsening or symptoms persist longer then expected  Upper Respiratory Infection, Adult An upper respiratory infection (URI) is also known as the common cold. It is often caused by a type of germ (virus). Colds are easily spread (contagious). You can pass it to others by kissing, coughing, sneezing, or drinking out of the same glass. Usually, you get better in 1 to 3  weeks.  However, the cough can last for even longer. HOME CARE   Only take medicine as told by your doctor. Follow instructions provided above.  Drink enough water and fluids to keep your pee (urine) clear or pale yellow.  Get plenty of rest.  Return to work when your temperature is < 100 for 24 hours or as told by your doctor. You may use a face mask and wash your hands to stop  your cold from spreading. GET HELP RIGHT AWAY IF:   After the first few days, you feel you are getting worse.  You have questions about your medicine.  You have chills, shortness of breath, or red spit (mucus).  You have pain in the face for more then 1-2 days, especially when you bend forward.  You have a fever, puffy (swollen) neck, pain when you swallow, or white spots in the back of your throat.  You have a bad headache, ear pain, sinus pain, or chest pain.  You have a high-pitched whistling sound when you breathe in and out (wheezing).  You cough up blood.  You have sore muscles or a stiff neck. MAKE SURE YOU:   Understand these instructions.  Will watch your condition.  Will get help right away if you are not doing well or get worse. Document Released: 11/06/2007 Document Revised: 08/12/2011 Document Reviewed: 08/25/2013 [facility] Patient Information 2015 [place], [place]. This information is not intended to replace advice given to you by your health care provider. Make sure you discuss any questions you have with your health care provider.

## 2016-07-11 NOTE — Addendum Note (Signed)
Addended by: Johnella MoloneyFUNDERBURK, JO A on: 07/11/2016 03:08 PM   Modules accepted: Orders

## 2016-07-11 NOTE — Progress Notes (Signed)
HPI:  Acute visit for URI: -started: yesterday -symptoms:nasal congestion, sore throat, cough -gagged while brushing teeth and saw BRB in mucus - has tooth that bleeds from time to time - but she was worried could be pneumonia -denies:fever, SOB, NVD, tooth pain, jaw pain, tenesmus -has tried: OTC cough and cold medications -sick contacts/travel/risks: no reported flu, strep or tick exposure -Hx of: allergies -reports took her BP meds, but not able to list meds and reports needs refills  ROS: See pertinent positives and negatives per HPI.  Past Medical History:  Diagnosis Date  . 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 Hyperlipidemia   . Hypertension   . Morbid obesity (HCC)     Past Surgical History:  Procedure Laterality Date  . ABDOMINAL HYSTERECTOMY    . CESAREAN SECTION    . TONSILLECTOMY      Family History  Problem Relation Age of Onset  . Heart disease Mother   . Diabetes Mother   . Hypertension Sister   . Diabetes Sister     Social History   Social History  . Marital status: Single    Spouse name: N/A  . Number of children: N/A  . Years of education: N/A   Social History Main Topics  . Smoking status: Never Smoker  . Smokeless tobacco: Never Used  . Alcohol use No  . Drug use: No  . Sexual activity: Not on file   Other Topics Concern  . 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  amLODipine (NORVASC) 10 MG tablet, Take 1 tablet (10 mg total) by mouth daily., Disp: 90 tablet, Rfl: 3 .  carvedilol (COREG) 6.25 MG tablet, Take 1 tablet (6.25 mg total) by mouth 2 (two) times daily with a meal., Disp: 120 tablet, Rfl: 3 .  glipiZIDE (GLUCOTROL) 5 MG tablet, Take 1 tablet (5 mg total) by mouth 2 (two) times daily before a  meal., Disp: 120 tablet, Rfl: 3 .  hydrALAZINE (APRESOLINE) 25 MG tablet, Take 1 tablet (25 mg total) by mouth 2 (two) times daily., Disp: 60 tablet, Rfl: 0 .  hydrochlorothiazide (HYDRODIURIL) 25 MG tablet, Take 1 tablet (25 mg total) by mouth daily., Disp: 90 tablet, Rfl: 3 .  lisinopril (PRINIVIL,ZESTRIL) 20 MG tablet, Take 1 tablet (20 mg total) by mouth daily., Disp: 90 tablet, Rfl: 3 .  metFORMIN (GLUCOPHAGE) 1000 MG tablet, TAKE 1 TABLET BY MOUTH TWICE DAILY, Disp: 60 tablet, Rfl: 0 .  rosuvastatin (CRESTOR) 20 MG tablet, Take 1 tablet (20 mg total) by mouth daily., Disp: 90 tablet, Rfl: 3 .  benzonatate (TESSALON PERLES) 100 MG capsule, Take 1 capsule (100 mg total) by mouth 3 (three) times daily as needed for cough., Disp: 20 capsule, Rfl: 0  EXAM:  Vitals:   07/11/16 1155  BP: (!) 150/92  Pulse: 72  Temp: 98.7 F (37.1 C)    Body mass index is 51.87 kg/m.  GENERAL: vitals reviewed and listed above, alert, oriented, appears well hydrated and in no acute distress  HEENT: atraumatic, conjunttiva clear, no obvious abnormalities on inspection of external nose and ears, normal appearance of ear canals and TMs, clear  nasal congestion, mild post oropharyngeal erythema with PND, no tonsillar edema or exudate, no sinus TTP, fractured tooth with mild surrounding gum inflammation and small area dried blood L upper post molar  NECK: no obvious masses on inspection  LUNGS: clear to auscultation bilaterally, no wheezes, rales or rhonchi, good air movement  CV: HRRR, no peripheral edema  MS: moves all extremities without noticeable abnormality  PSYCH: pleasant and cooperative, no obvious depression or anxiety  ASSESSMENT AND PLAN:  Discussed the following assessment and plan:  Respiratory infection - Plan: POC Influenza A&B(BINAX/QUICKVUE), DG Chest 2 View  Dental infection  Blood in mouth of unknown source  -given HPI and exam findings today, a serious infection or illness is  unlikely. We discussed potential etiologies, with VURI being most likely, and advised supportive care and monitoring for this. I think the bleeding was from the gum - likely disrupted by brushing. Will get CXR given her concern. Advised prompt dental eval and to seek immediate medical attengion if hemoptysis, hematemesis, etc. We discussed treatment side effects, likely course, antibiotic misuse, transmission, and signs of developing a serious illness. -of course, we advised to return or notify a doctor immediately if symptoms worsen or persist or new concerns arise.    Patient Instructions  BEFORE YOU LEAVE: -flu shot -xray sheet -refill all blood pressure medications - 90 days with 1 refill -follow up in 1 month  See your dentist about the tooth and any further bleeding.  Seek care immediately if vomiting, blood, coughing up blood, worsening, trouble breathing or other concerns   INSTRUCTIONS FOR UPPER RESPIRATORY INFECTION:  -plenty of rest and fluids  -nasal saline wash 2-3 times daily (use prepackaged nasal saline or bottled/distilled water if making your own)   -can use AFRIN nasal spray for drainage and nasal congestion - but do NOT use longer then 3-4 days  -can use tylenol (in no history of liver disease) or ibuprofen (if no history of kidney disease, bowel bleeding or significant heart disease) as directed for aches and sorethroat  -in the winter time, using a humidifier at night is helpful (please follow cleaning instructions)  -if you are taking a cough medication - use only as directed, may also try a teaspoon of honey to coat the throat and throat lozenges. II sent tessalon for cough.  -for sore throat, salt water gargles can help  -follow up if you have fevers, facial pain, tooth pain, difficulty breathing or are worsening or symptoms persist longer then expected  Upper Respiratory Infection, Adult An upper respiratory infection (URI) is also known as the common  cold. It is often caused by a type of germ (virus). Colds are easily spread (contagious). You can pass it to others by kissing, coughing, sneezing, or drinking out of the same glass. Usually, you get better in 1 to 3  weeks.  However, the cough can last for even longer. HOME CARE   Only take medicine as told by your doctor. Follow instructions provided above.  Drink enough water and fluids to keep your pee (urine) clear or pale yellow.  Get plenty of rest.  Return to work when your temperature is < 100 for 24 hours or as told by your doctor. You may use a face mask and wash your hands to stop your cold from spreading. GET HELP RIGHT AWAY IF:   After the first few days, you feel you are getting worse.  You have questions about your medicine.  You have chills, shortness of  breath, or red spit (mucus).  You have pain in the face for more then 1-2 days, especially when you bend forward.  You have a fever, puffy (swollen) neck, pain when you swallow, or white spots in the back of your throat.  You have a bad headache, ear pain, sinus pain, or chest pain.  You have a high-pitched whistling sound when you breathe in and out (wheezing).  You cough up blood.  You have sore muscles or a stiff neck. MAKE SURE YOU:   Understand these instructions.  Will watch your condition.  Will get help right away if you are not doing well or get worse. Document Released: 11/06/2007 Document Revised: 08/12/2011 Document Reviewed: 08/25/2013 [facility] Patient Information 2015 [place], [place]. This information is not intended to replace advice given to you by your health care provider. Make sure you discuss any questions you have with your health care provider.     Kriste BasqueKIM, Gracin Mcpartland R., DO

## 2016-10-03 ENCOUNTER — Other Ambulatory Visit: Payer: Self-pay | Admitting: Family Medicine

## 2016-10-11 ENCOUNTER — Telehealth: Payer: Self-pay | Admitting: Family Medicine

## 2016-10-11 ENCOUNTER — Ambulatory Visit (INDEPENDENT_AMBULATORY_CARE_PROVIDER_SITE_OTHER): Payer: BC Managed Care – PPO | Admitting: Family Medicine

## 2016-10-11 VITALS — Temp 100.2°F | Ht 70.0 in | Wt 358.0 lb

## 2016-10-11 DIAGNOSIS — J018 Other acute sinusitis: Secondary | ICD-10-CM | POA: Diagnosis not present

## 2016-10-11 MED ORDER — METHYLPREDNISOLONE ACETATE 80 MG/ML IJ SUSP
120.0000 mg | Freq: Once | INTRAMUSCULAR | Status: AC
  Administered 2016-10-11: 120 mg via INTRAMUSCULAR

## 2016-10-11 MED ORDER — LEVOFLOXACIN 500 MG PO TABS: 500.0000 mg | ORAL_TABLET | Freq: Every day | ORAL | 0 refills | Status: AC

## 2016-10-11 NOTE — Telephone Encounter (Signed)
I called the pt and advised her she is due for a follow up visit with Dr Selena BattenKim.  Patient stated she has had a severe headache and feels like her head is spinning and questioned if a medication could be called in for her since she thinks she has a sinus infection.  I advised the pt she needs to be seen in the office and scheduled an appt for this afternoon with Dr Clent RidgesFry as Dr Selena BattenKim is out of the office.

## 2016-10-11 NOTE — Telephone Encounter (Signed)
Ok, per Dr. Fry. 

## 2016-10-11 NOTE — Patient Instructions (Signed)
WE NOW OFFER   [facility] FAST TRACK!!!  SAME DAY Appointments for ACUTE CARE  Such as: Sprains, Injuries, cuts, abrasions, rashes, muscle pain, joint pain, back pain Colds, flu, sore throats, headache, allergies, cough, fever  Ear pain, sinus and eye infections Abdominal pain, nausea, vomiting, diarrhea, upset stomach Animal/insect bites  3 Easy Ways to Schedule: Walk-In Scheduling Call in scheduling Mychart Sign-up: [email or web address]         

## 2016-10-11 NOTE — Telephone Encounter (Signed)
Pt would like to have a call back wanted to know if she should have an appointment.  I asked pt if she would like for me to make her an appointment pt refused want to wait to her back from Dr. Selena BattenKim or Randa EvensJoanne.

## 2016-10-14 ENCOUNTER — Encounter: Payer: Self-pay | Admitting: Family Medicine

## 2016-10-14 NOTE — Progress Notes (Signed)
   Subjective:    Patient ID: Jenna Stokes, female    DOB: 05-Mar-1966, 51 y.o.   MRN: [id number]  HPI Here for 3 days of sinus pressure, headache, PND, and a dry cough. She has also had intermittent dizziness which makes her feel like the room is moving.    Review of Systems  Constitutional: Negative.   HENT: Positive for congestion, postnasal drip, sinus pain and sinus pressure. Negative for sore throat.   Eyes: Negative.   Respiratory: Positive for cough.   Neurological: Positive for dizziness. Negative for tremors, seizures, syncope, facial asymmetry, speech difficulty, weakness, light-headedness, numbness and headaches.       Objective:   Physical Exam  Constitutional: She is oriented to person, place, and time. She appears well-developed and well-nourished.  HENT:  Right Ear: External ear normal.  Left Ear: External ear normal.  Nose: Nose normal.  Mouth/Throat: Oropharynx is clear and moist.  Eyes: Conjunctivae and EOM are normal. Pupils are equal, round, and reactive to light.  Neck: No thyromegaly present.  Cardiovascular: Normal rate, regular rhythm, normal heart sounds and intact distal pulses.   Pulmonary/Chest: Effort normal and breath sounds normal.  Lymphadenopathy:    She has no cervical adenopathy.  Neurological: She is alert and oriented to person, place, and time. No cranial nerve deficit. Coordination normal.          Assessment & Plan:  Sinusitis, treat with Levaquin and Mucinex. Given a steroid shot today.  Gershon CraneStephen Fry, MD

## 2017-02-18 ENCOUNTER — Encounter: Payer: Self-pay | Admitting: Family Medicine

## 2017-02-18 ENCOUNTER — Ambulatory Visit (INDEPENDENT_AMBULATORY_CARE_PROVIDER_SITE_OTHER): Payer: BC Managed Care – PPO | Admitting: Family Medicine

## 2017-02-18 VITALS — BP 152/90 | HR 68 | Temp 98.8°F | Ht 70.0 in | Wt 354.5 lb

## 2017-02-18 DIAGNOSIS — I10 Essential (primary) hypertension: Secondary | ICD-10-CM | POA: Diagnosis not present

## 2017-02-18 DIAGNOSIS — E785 Hyperlipidemia, unspecified: Secondary | ICD-10-CM | POA: Diagnosis not present

## 2017-02-18 DIAGNOSIS — E119 Type 2 diabetes mellitus without complications: Secondary | ICD-10-CM | POA: Diagnosis not present

## 2017-02-18 DIAGNOSIS — R0683 Snoring: Secondary | ICD-10-CM

## 2017-02-18 DIAGNOSIS — E1169 Type 2 diabetes mellitus with other specified complication: Secondary | ICD-10-CM | POA: Diagnosis not present

## 2017-02-18 DIAGNOSIS — I152 Hypertension secondary to endocrine disorders: Secondary | ICD-10-CM

## 2017-02-18 DIAGNOSIS — E1159 Type 2 diabetes mellitus with other circulatory complications: Secondary | ICD-10-CM | POA: Diagnosis not present

## 2017-02-18 LAB — BASIC METABOLIC PANEL
BUN: 13 mg/dL (ref 6–23)
CALCIUM: 10.3 mg/dL (ref 8.4–10.5)
CO2: 31 meq/L (ref 19–32)
Chloride: 100 mEq/L (ref 96–112)
Creatinine, Ser: 0.8 mg/dL (ref 0.40–1.20)
GFR: 97.26 mL/min (ref >60.00)
Glucose, Bld: 135 mg/dL — ABNORMAL HIGH (ref 70–99)
Potassium: 3.7 mEq/L (ref 3.5–5.1)
SODIUM: 137 meq/L (ref 135–145)

## 2017-02-18 LAB — LIPID PANEL
CHOL/HDL RATIO: 4
Cholesterol: 208 mg/dL — ABNORMAL HIGH (ref 0–200)
HDL: 53.4 mg/dL (ref >39.00)
LDL CALC: 123 mg/dL — AB (ref 0–99)
NonHDL: 154.46
TRIGLYCERIDES: 157 mg/dL — AB (ref 0.0–149.0)
VLDL: 31.4 mg/dL (ref 0.0–40.0)

## 2017-02-18 LAB — CBC
HCT: 39.2 % (ref 36.0–46.0)
Hemoglobin: 13 g/dL (ref 12.0–15.0)
MCHC: 33.1 g/dL (ref 30.0–36.0)
MCV: 89.8 fl (ref 78.0–100.0)
PLATELETS: 261 10*3/uL (ref 150.0–400.0)
RBC: 4.36 Mil/uL (ref 3.87–5.11)
RDW: 13.2 % (ref 11.5–15.5)
WBC: 9.7 10*3/uL (ref 4.0–10.5)

## 2017-02-18 LAB — HEMOGLOBIN A1C: Hgb A1c MFr Bld: 7.8 % — ABNORMAL HIGH (ref 4.6–6.5)

## 2017-02-18 MED ORDER — METFORMIN HCL ER 500 MG PO TB24: 500.0000 mg | ORAL_TABLET | Freq: Every day | ORAL | 1 refills | Status: DC

## 2017-02-18 MED ORDER — CARVEDILOL 12.5 MG PO TABS: 12.5000 mg | ORAL_TABLET | Freq: Two times a day (BID) | ORAL | 3 refills | Status: DC

## 2017-02-18 NOTE — Progress Notes (Signed)
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 She reports she does not think she has sleep apnea as she sleeps fine and does not feel tired during the day. She has been told that she snores. She reports history of tonsillectomy. Denies or sleep, daytime somnolence, apneic spells in her sleep. Her blood pressure is a little up on arrival, improved a little on recheck. In the past her blood pressure was good when she was taking all her medications, but she often has trouble with medication compliance. She reports she is taking all her medications but cannot tell me what they are. She reports she stopped her metformin a few months ago. Reports she picked up her refill and that patch pills smelled awful and made her nauseous so she stopped taking it. This has never occurred before with this medication -she had been on it for some time at that point. She is past due for labs and several health maintenance measures.kinds flu shot today but agrees to do next month.  ROS: See pertinent positives and negatives per HPI.  Past Medical History:  Diagnosis Date  . 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 Hyperlipidemia   . Hypertension   . Morbid obesity (HCC)     Past Surgical History:  Procedure Laterality Date  . ABDOMINAL HYSTERECTOMY    . CESAREAN SECTION    . TONSILLECTOMY      Family History  Problem Relation Age of Onset  . Heart disease Mother   . Diabetes Mother   . Hypertension Sister   . Diabetes Sister     Social History   Social History  . Marital status: Single    Spouse name: N/A  . Number of children: N/A  . Years of education: N/A   Social History Main Topics  . Smoking status:  Never Smoker  . Smokeless tobacco: Never Used  . Alcohol use No  . Drug use: No  . Sexual activity: Not Asked   Other Topics Concern  . 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  amLODipine (NORVASC) 10 MG tablet, Take 1 tablet (10 mg total) by mouth daily., Disp: 90 tablet, Rfl: 1 .  carvedilol (COREG) 12.5 MG tablet, Take 1 tablet (12.5 mg total) by mouth 2 (two) times daily with a meal., Disp: 60 tablet, Rfl: 3 .  glipiZIDE (GLUCOTROL) 5 MG tablet, Take 1 tablet (5 mg total) by mouth 2 (two) times daily before a meal., Disp: 120 tablet, Rfl: 3 .  hydrALAZINE (APRESOLINE) 25 MG tablet, Take 1 tablet (25 mg total) by mouth 2 (two) times daily., Disp: 180 tablet, Rfl: 1 .  hydrochlorothiazide (HYDRODIURIL) 25 MG tablet, Take 1 tablet (25 mg total) by mouth daily., Disp: 90 tablet, Rfl: 1 .  lisinopril (PRINIVIL,ZESTRIL) 20 MG tablet, Take 1 tablet (20 mg total) by mouth daily., Disp: 90 tablet, Rfl: 1 .  rosuvastatin (CRESTOR) 20  MG tablet, Take 1 tablet (20 mg total) by mouth daily., Disp: 90 tablet, Rfl: 3 .  metFORMIN (GLUCOPHAGE-XR) 500 MG 24 hr tablet, Take 1 tablet (500 mg total) by mouth daily with breakfast., Disp: 60 tablet, Rfl: 1  EXAM:  Vitals:   02/18/17 1057  BP: (!) 152/90  Pulse: 68  Temp: 98.8 F (37.1 C)  SpO2: 98%    Body mass index is 50.87 kg/m.  GENERAL: vitals reviewed and listed above, alert, oriented, appears well hydrated and in no acute distress  HEENT: atraumatic, conjunttiva clear, no obvious abnormalities on inspection of external nose and ears  NECK: no obvious masses on inspection  LUNGS: clear to auscultation bilaterally, no wheezes, rales or rhonchi, good air movement  CV: HRRR, no peripheral edema  MS: moves all extremities without noticeable  abnormality  PSYCH: pleasant and cooperative, no obvious depression or anxiety  ASSESSMENT AND PLAN:  Discussed the following assessment and plan:  Snoring - Plan: Ambulatory referral to Pulmonology  Morbid obesity (HCC) - Plan: Ambulatory referral to Pulmonology  Type 2 diabetes mellitus without complication, without long-term current use of insulin (HCC) - Plan: Hemoglobin A1c  Hypertension associated with diabetes (HCC) - Plan: Basic metabolic panel, CBC  Hyperlipidemia associated with type 2 diabetes mellitus (HCC) - Plan: Lipid panel  -referral for evaluation for sleep apnea per her request -Labs -Advised to bring all of her medications to her next visit, she is adamant that she is taking her medications so we did increase her Coreg a little -Lifestyle recommendations for obesity, in the past she was not interested in considering other options, but now she is interested in bariatric surgery and a comprehensive obesity program - advised that she take a seminar through [facility] regarding bariatric surgery and asked my assistant to help her with a referral to Dr. Dalbert Garnet [place] try long-acting metformin -Follow up 1 month -Patient advised to return or notify a doctor immediately if symptoms worsen or persist or new concerns arise.  Patient Instructions  BEFORE YOU LEAVE: -update/obtain eye report -colon cancer screening options and order -referral to Dr. Dalbert Garnet for morbid obesity -follow up: 1 month (BRING ALL MEDICATIONS WITH YOU PLEASE) -labs  Increase coreg to 12.5mg  twice daily  Start the long acting metformin 500 once daily at night for 1 week, then  once daily.  Check out the [facility] bariatric surgery website and sign up for a seminar.  We have ordered labs or studies at this visit. It can take up to 1-2 weeks for results and processing. IF results require follow up or explanation, we will call you with instructions. Clinically stable results will be  released to your [facility]. If you have not heard from [place] or cannot find your results in [facility] in 2 weeks please contact our office at [phone].  If you are not yet signed up for [facility], please consider signing up.  -We placed a referral for you as discussed for the sleep evaluation. It usually takes about 1-2 weeks to process and schedule this referral. If you have not heard from [place] regarding this appointment in 2 weeks please contact our office.           [name]., DO

## 2017-02-18 NOTE — Patient Instructions (Addendum)
BEFORE YOU LEAVE: -update/obtain eye report -colon cancer screening options and order -referral to Dr. Dalbert Garnet for morbid obesity -follow up: 1 month (BRING ALL MEDICATIONS WITH YOU PLEASE) -labs  Increase coreg to 12.5mg  twice daily  Start the long acting metformin 500 once daily at night for 1 week, then  once daily.  Check out the [facility] bariatric surgery website and sign up for a seminar.  We have ordered labs or studies at this visit. It can take up to 1-2 weeks for results and processing. IF results require follow up or explanation, we will call you with instructions. Clinically stable results will be released to your [facility]. If you have not heard from [place] or cannot find your results in [facility] in 2 weeks please contact our office at [phone].  If you are not yet signed up for [facility], please consider signing up.  -We placed a referral for you as discussed for the sleep evaluation. It usually takes about 1-2 weeks to process and schedule this referral. If you have not heard from [place] regarding this appointment in 2 weeks please contact our office.

## 2017-02-21 ENCOUNTER — Telehealth: Payer: Self-pay | Admitting: Pediatrics

## 2017-02-21 MED ORDER — ROSUVASTATIN CALCIUM 20 MG PO TABS: 30.0000 mg | ORAL_TABLET | Freq: Every day | ORAL | 2 refills | Status: DC

## 2017-02-21 NOTE — Telephone Encounter (Signed)
Increased dose of Crestor sent to pharmacy per Dr. Elmyra Ricks order in result note.

## 2017-03-20 ENCOUNTER — Ambulatory Visit (INDEPENDENT_AMBULATORY_CARE_PROVIDER_SITE_OTHER): Payer: BC Managed Care – PPO | Admitting: Internal Medicine

## 2017-03-20 ENCOUNTER — Encounter: Payer: Self-pay | Admitting: Internal Medicine

## 2017-03-20 VITALS — BP 126/76 | HR 79 | Ht 71.0 in | Wt 357.4 lb

## 2017-03-20 DIAGNOSIS — G4733 Obstructive sleep apnea (adult) (pediatric): Secondary | ICD-10-CM | POA: Diagnosis not present

## 2017-03-20 NOTE — Assessment & Plan Note (Addendum)
Tentative high probability diagnosis based on history and physical. She lives alone and minimizes symptoms. We need to resolve this issue to help with her DOT determination. I discussed the basic physiology and medical concerns associated with obstructive sleep apnea and emphasized her responsibility to be alert while driving. Plan-schedule sleep study. Based on results, if appropriate, we can get therapy started fairly soon.

## 2017-03-20 NOTE — Patient Instructions (Signed)
Order- schedule unattended home sleep test    Dx OSA  Please call me for results and recommendation about 2 weeks after the study is done. If appropriate, we may be able to get treatment started.  Please call as needed

## 2017-03-20 NOTE — Progress Notes (Signed)
03/20/17-51 year old female never smoker for sleep evaluation.  Sleep Consult; Dr Kriste BasqueHannah Kim; Never had sleep study. 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 DOT Md told her that and hence Dr, Selena BattenKim sent her here.  Medical history of morbid obesity, DM 2, HBP, hyperlipidemia. 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 Epworth endorsed by patient 0/0 She says boyfriend has noted that she snored "a couple of times". She denies daytime sleepiness. ENT surgery-tonsils and adenoids. She denies heart or lung disease. Denies family history of OSA. Scheduled for flu shot next week  Prior to Admission medications   Medication Sig Start Date End Date Taking? Authorizing Provider  amLODipine (NORVASC) 10 MG tablet Take 1 tablet (10 mg total) by mouth daily. 07/11/16  Yes Terressa KoyanagiKim, Hannah R, DO  carvedilol (COREG) 12.5 MG tablet Take 1 tablet (12.5 mg total) by mouth 2 (two) times daily with a meal. 02/18/17  Yes Kriste BasqueKim, Hannah R, DO  glipiZIDE (GLUCOTROL) 5 MG tablet Take 1 tablet (5 mg total) by mouth 2 (two) times daily before a meal. 04/05/16  Yes Kriste BasqueKim, Hannah R, DO  hydrALAZINE (APRESOLINE) 25 MG tablet Take 1 tablet (25 mg total) by mouth 2 (two) times daily. 07/11/16  Yes Terressa KoyanagiKim, Hannah R, DO  hydrochlorothiazide (HYDRODIURIL) 25 MG tablet Take 1 tablet (25 mg total) by mouth daily. 07/11/16  Yes Kriste BasqueKim, Hannah R, DO  lisinopril (PRINIVIL,ZESTRIL) 20 MG tablet Take 1 tablet (20 mg total) by mouth daily. 07/11/16  Yes Terressa KoyanagiKim, Hannah R, DO  metFORMIN (GLUCOPHAGE-XR) 500 MG 24 hr tablet Take 1 tablet (500 mg total) by mouth daily with breakfast. 02/18/17  Yes Kriste BasqueKim, Hannah R, DO  rosuvastatin (CRESTOR) 20 MG tablet Take 1.5 tablets (30 mg total) by mouth daily. 02/21/17 05/22/17 Yes Terressa KoyanagiKim, Hannah R, DO   Past Medical History:  Diagnosis Date  . 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 Hyperlipidemia   .  Hypertension   . Morbid obesity (HCC)    Past Surgical History:  Procedure Laterality Date  . ABDOMINAL HYSTERECTOMY    . CESAREAN SECTION    . TONSILLECTOMY     Family History  Problem Relation Age of Onset  . Heart disease Mother   . Diabetes Mother   . Hypertension Sister   . Diabetes Sister    Social History   Social History  . Marital status: Single    Spouse name: N/A  . Number of children: N/A  . Years of education: N/A   Occupational History  . Not on file.   Social History Main Topics  . Smoking status: Never Smoker  . Smokeless tobacco: Never Used  . Alcohol use No  . Drug use: No  . Sexual activity: Not on file   Other Topics Concern  . 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 HEENT:    headaches, difficulty swallowing, tooth/dental problems, sore throat,       sneezing, itching, ear ache, nasal  congestion, post nasal drip, snoring CV:    chest pain, orthopnea, PND, + swelling in lower extremities, anasarca,                                                     dizziness, palpitations Resp:   shortness of breath with exertion or at rest.                productive cough,   non-productive cough, coughing up of blood.              change in color of mucus.  wheezing.   Skin:    rash or lesions. GI:  No-   heartburn, indigestion, abdominal pain, nausea, vomiting, diarrhea,                 change in bowel habits, loss of appetite GU: dysuria, change in color of urine, no urgency or frequency.   flank pain. MS:   joint pain, stiffness, decreased range of motion, back pain. Neuro-     nothing unusual Psych:  change in mood or affect.  depression or anxiety.   memory loss.  OBJ- Physical  Exam General- Alert, Oriented, Affect-appropriate, Distress- none acute, + morbid obesity Skin- rash-none, lesions- none, excoriation- none Lymphadenopathy- none Head- atraumatic            Eyes- Gross vision intact, PERRLA, conjunctivae and secretions clear            Ears- Hearing, canals-normal            Nose- Clear, no-Septal dev, mucus, polyps, erosion, perforation             Throat- Mallampati III-IV , mucosa clear , drainage- none, tonsils- atrophic Neck- flexible , trachea midline, no stridor , thyroid nl, carotid no bruit Chest - symmetrical excursion , unlabored           Heart/CV- RRR , no murmur , no gallop  , no rub, nl s1 s2                           - JVD- none , edema- none, stasis changes- none, varices- none           Lung- clear to P&A, wheeze- none, cough- none , dullness-none, rub- none           Chest wall-  Abd-  Br/ Gen/ Rectal- Not done, not indicated Extrem- cyanosis- none, clubbing, none, atrophy- none, strength- nl Neuro- grossly intact to observation

## 2017-03-20 NOTE — Assessment & Plan Note (Signed)
I explained the impact of body weight on sleep disordered breathing and emphasized the importance of seeking weight loss by all appropriate means.

## 2017-03-23 NOTE — Progress Notes (Deleted)
HPI:  Follow up: Due for colon ca screening, eye exam, foot exam, flu shot  Morbid obesity: -referred to obesity clinic and advised her to check out the [facility]ariat[facility] surgery program  HTN: -hx poor compliance -increased coreg to 12.5 bid 9/18 -meds:  DM: -she had stopped her metfomin because did not like the smell of the pills -started long acting metformin 9/18  HLD: -increased crestor 9/18  Possible OSA: -asymptomatic -seeing pulm for eval  ROS: See pertinent positives and negatives per HPI.  Past Medical History:  Diagnosis Date  . 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 Hyperlipidemia   . Hypertension   . Morbid obesity (HCC)     Past Surgical History:  Procedure Laterality Date  . ABDOMINAL HYSTERECTOMY    . CESAREAN SECTION    . TONSILLECTOMY      Family History  Problem Relation Age of Onset  . Heart disease Mother   . Diabetes Mother   . Hypertension Sister   . Diabetes Sister     Social History   Social History  . Marital status: Single    Spouse name: N/A  . Number of children: N/A  . Years of education: N/A   Social History Main Topics  . Smoking status: Never Smoker  . Smokeless tobacco: Never Used  . Alcohol use No  . Drug use: No  . Sexual activity: Not on file   Other Topics Concern  . 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  amLODipine (NORVASC) 10 MG tablet, Take 1 tablet (10 mg total) by mouth daily., Disp: 90 tablet, Rfl: 1 .  carvedilol (COREG) 12.5 MG tablet, Take 1 tablet (12.5 mg total) by mouth 2 (two) times daily with a meal., Disp: 60 tablet, Rfl: 3 .  glipiZIDE (GLUCOTROL) 5 MG tablet, Take 1 tablet (5 mg total) by mouth 2 (two) times daily before a meal., Disp: 120 tablet, Rfl:  3 .  hydrALAZINE (APRESOLINE) 25 MG tablet, Take 1 tablet (25 mg total) by mouth 2 (two) times daily., Disp: 180 tablet, Rfl: 1 .  hydrochlorothiazide (HYDRODIURIL) 25 MG tablet, Take 1 tablet (25 mg total) by mouth daily., Disp: 90 tablet, Rfl: 1 .  lisinopril (PRINIVIL,ZESTRIL) 20 MG tablet, Take 1 tablet (20 mg total) by mouth daily., Disp: 90 tablet, Rfl: 1 .  metFORMIN (GLUCOPHAGE-XR) 500 MG 24 hr tablet, Take 1 tablet (500 mg total) by mouth daily with breakfast., Disp: 60 tablet, Rfl: 1 .  rosuvastatin (CRESTOR) 20 MG tablet, Take 1.5 tablets (30 mg total) by mouth daily., Disp: 45 tablet, Rfl: 2  EXAM:  There were no vitals filed for this visit.  There is no height or weight on file to calculate BMI.  GENERAL: vitals reviewed and listed above, alert, oriented, appears well hydrated and in no acute distress  HEENT: atraumatic, conjunttiva clear, no obvious abnormalities on inspection of external nose and ears  NECK: no obvious masses on inspection  LUNGS: clear to auscultation bilaterally, no wheezes, rales or rhonchi, good air movement  CV: HRRR, no peripheral edema  MS: moves all extremities without noticeable abnormality  PSYCH: pleasant and cooperative,  no obvious depression or anxiety  ASSESSMENT AND PLAN:  Discussed the following assessment and plan:  No diagnosis found.  -Patient advised to return or notify a doctor immediately if symptoms worsen or persist or new concerns arise.  There are no Patient Instructions on file for this visit.  [name]., DO

## 2017-03-24 ENCOUNTER — Ambulatory Visit: Payer: BC Managed Care – PPO | Admitting: Family Medicine

## 2017-03-29 NOTE — Progress Notes (Signed)
HPI:  Hx DM, HLD, HTN, poor compliance with medications and morbid obesity. Tried metformin longacting last visit as she stopped prior metformin due to smell of the pills. BP was up last visit but she was not sure what meds she was taking. Referred to pulm for eval for OSA last visit. Referred to Dr. Dalbert Garnet for wt management. Increased crestor. Reports is doing well. She does not have time for a healthy lifestyle currently. Reports she works 80 hours a week. She is trying to do better. Reports she is taking all her medications daily. Reports that new metformin is much better and she tolerates this well. Reports she has increased her cholesterol medication. Denies chest pain, shortness of breath, dyspnea on exertion or any other symptoms today Due for foot exam, flu shot, colon ca screening, eye exam, pap and mammo. Refuses flu shot today, wants to do this next week. As Cologuard at home, has not completed this yet. ROS: See pertinent positives and negatives per HPI.  Past Medical History:  Diagnosis Date  . 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 Hyperlipidemia   . Hypertension   . Morbid obesity (HCC)     Past Surgical History:  Procedure Laterality Date  . ABDOMINAL HYSTERECTOMY    . CESAREAN SECTION    . TONSILLECTOMY      Family History  Problem Relation Age of Onset  . Heart disease Mother   . Diabetes Mother   . Hypertension Sister   . Diabetes Sister     Social History   Social History  . Marital status: Single    Spouse name: N/A  . Number of children: N/A  . Years of education: N/A   Social History Main Topics  . Smoking status: Never Smoker  . Smokeless tobacco: Never Used  . Alcohol use No  . Drug use: No  . Sexual activity: Not Asked   Other Topics Concern  . 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  amLODipine (NORVASC) 10 MG tablet, Take 1 tablet (10 mg total) by mouth daily., Disp: 90 tablet, Rfl: 1 .  carvedilol (COREG) 12.5 MG tablet, Take 1 tablet (12.5 mg total) by mouth 2 (two) times daily with a meal., Disp: 60 tablet, Rfl: 3 .  glipiZIDE (GLUCOTROL) 5 MG tablet, Take 1 tablet (5 mg total) by mouth 2 (two) times daily before a meal., Disp: 120 tablet, Rfl: 3 .  hydrALAZINE (APRESOLINE) 25 MG tablet, Take 1 tablet (25 mg total) by mouth 2 (two) times daily., Disp: 180 tablet, Rfl: 1 .  hydrochlorothiazide (HYDRODIURIL) 25 MG tablet, Take 1 tablet (25 mg total) by mouth daily., Disp: 90 tablet, Rfl: 1 .  lisinopril (PRINIVIL,ZESTRIL) 30 MG tablet, Take 1 tablet (30 mg total) by mouth daily., Disp: 90 tablet, Rfl: 1 .  metFORMIN (GLUCOPHAGE-XR) 500 MG 24 hr tablet, Take 1 tablet (500 mg total) by mouth daily with breakfast., Disp: 60 tablet, Rfl: 1 .  rosuvastatin (CRESTOR) 20 MG tablet, Take 1.5 tablets (30 mg total) by mouth daily., Disp: 45 tablet, Rfl: 2  EXAM:  Vitals:   03/31/17 1114 03/31/17 1116  BP: (!) 152/90 (!) 148/88  Pulse: 71   Temp: 98.8 F (37.1 C)     Body mass index is 50.22 kg/m.  GENERAL: vitals reviewed and listed above, alert, oriented, appears well hydrated and in no acute distress  HEENT: atraumatic, conjunttiva clear, no obvious abnormalities on inspection of external nose and ears  NECK: no obvious masses on inspection  LUNGS: clear to auscultation bilaterally, no wheezes, rales or rhonchi, good air movement  CV: HRRR, no peripheral edema  MS: moves all extremities without noticeable abnormality  PSYCH: pleasant and cooperative, no obvious depression or anxiety  ASSESSMENT AND PLAN:  Discussed the following assessment and plan:  Hypertension associated with diabetes (HCC)  Morbid obesity (HCC)  Type 2 diabetes mellitus without complication, without long-term  current use of insulin (HCC)  Hyperlipidemia associated with type 2 diabetes mellitus (HCC)  -blood pressure remains a little elevated, lifestyle recommendations and we will increase the lisinopril to 30 mg -Advised her to complete the Cologuard test promptly -Advised that she take all her medications daily -Follow up 3 months -Then to schedule visit next week for her flu shot -Advised to schedule her eye exam -Foot exam done today -Patient advised to return or notify a doctor immediately if symptoms worsen or persist or new concerns arise.  Patient Instructions  BEFORE YOU LEAVE: -set up flu shot visit with Ronnald CollumJo Anne -follow up: 3 months  INCREASE the lisinopril to 30mg  daily - I sent a new presription  Make sure you are taking the increased dose of the crestor (30 mg)  Take all medications daily  PLEASE complete the colon cancer screening  Schedule your diabetic eye exam    Kriste BasqueKIM, Adael Culbreath R., DO

## 2017-03-31 ENCOUNTER — Encounter: Payer: Self-pay | Admitting: Family Medicine

## 2017-03-31 ENCOUNTER — Ambulatory Visit (INDEPENDENT_AMBULATORY_CARE_PROVIDER_SITE_OTHER): Payer: BC Managed Care – PPO | Admitting: Family Medicine

## 2017-03-31 ENCOUNTER — Telehealth: Payer: Self-pay | Admitting: *Deleted

## 2017-03-31 VITALS — BP 148/88 | HR 71 | Temp 98.8°F | Ht 71.0 in | Wt 360.1 lb

## 2017-03-31 DIAGNOSIS — I10 Essential (primary) hypertension: Secondary | ICD-10-CM

## 2017-03-31 DIAGNOSIS — E119 Type 2 diabetes mellitus without complications: Secondary | ICD-10-CM | POA: Diagnosis not present

## 2017-03-31 DIAGNOSIS — E1169 Type 2 diabetes mellitus with other specified complication: Secondary | ICD-10-CM

## 2017-03-31 DIAGNOSIS — E785 Hyperlipidemia, unspecified: Secondary | ICD-10-CM | POA: Diagnosis not present

## 2017-03-31 DIAGNOSIS — E1159 Type 2 diabetes mellitus with other circulatory complications: Secondary | ICD-10-CM | POA: Diagnosis not present

## 2017-03-31 DIAGNOSIS — I152 Hypertension secondary to endocrine disorders: Secondary | ICD-10-CM

## 2017-03-31 DIAGNOSIS — G4733 Obstructive sleep apnea (adult) (pediatric): Secondary | ICD-10-CM

## 2017-03-31 MED ORDER — LISINOPRIL 30 MG PO TABS: 30.0000 mg | ORAL_TABLET | Freq: Every day | ORAL | 1 refills | Status: DC

## 2017-03-31 NOTE — Telephone Encounter (Signed)
-----   Message from Tobe SosSally E Ottinger sent at 03/31/2017 12:01 PM EDT ----- Well I have a new issure for [facility] [employer] [facility] study has been denied she can have a split night with no precert so I guess give us an order Tobe SosSally E Ottinger

## 2017-03-31 NOTE — Telephone Encounter (Signed)
Spoke with Jenna Stokes-pt is aware of change in order and can do sleep study this Friday night. No need to change patients follow up OV. [facility]

## 2017-03-31 NOTE — Patient Instructions (Addendum)
BEFORE YOU LEAVE: -set up flu shot visit with Jenna CollumJo Stokes -follow up: 3 months  INCREASE the lisinopril to 30mg  daily - I sent a new presription  Make sure you are taking the increased dose of the crestor (30 mg)  Take all medications daily  PLEASE complete the colon cancer screening  Schedule your diabetic eye exam

## 2017-04-04 ENCOUNTER — Ambulatory Visit (HOSPITAL_BASED_OUTPATIENT_CLINIC_OR_DEPARTMENT_OTHER): Payer: BC Managed Care – PPO | Attending: Internal Medicine | Admitting: Internal Medicine

## 2017-04-04 VITALS — Ht 69.0 in | Wt 342.0 lb

## 2017-04-04 DIAGNOSIS — I493 Ventricular premature depolarization: Secondary | ICD-10-CM | POA: Insufficient documentation

## 2017-04-04 DIAGNOSIS — E669 Obesity, unspecified: Secondary | ICD-10-CM | POA: Insufficient documentation

## 2017-04-04 DIAGNOSIS — Z6841 Body Mass Index (BMI) 40.0 and over, adult: Secondary | ICD-10-CM | POA: Insufficient documentation

## 2017-04-04 DIAGNOSIS — I10 Essential (primary) hypertension: Secondary | ICD-10-CM | POA: Diagnosis not present

## 2017-04-04 DIAGNOSIS — G4733 Obstructive sleep apnea (adult) (pediatric): Secondary | ICD-10-CM

## 2017-04-04 DIAGNOSIS — E119 Type 2 diabetes mellitus without complications: Secondary | ICD-10-CM | POA: Diagnosis not present

## 2017-04-04 DIAGNOSIS — R0683 Snoring: Secondary | ICD-10-CM | POA: Diagnosis not present

## 2017-04-10 ENCOUNTER — Telehealth: Payer: Self-pay | Admitting: Internal Medicine

## 2017-04-10 NOTE — Telephone Encounter (Signed)
Spoke with pt, who is requesting sleep study results as soon as possible.   CY please advise. Thanks.

## 2017-04-11 ENCOUNTER — Encounter: Payer: Self-pay | Admitting: Internal Medicine

## 2017-04-11 DIAGNOSIS — G4733 Obstructive sleep apnea (adult) (pediatric): Secondary | ICD-10-CM | POA: Diagnosis not present

## 2017-04-11 NOTE — Telephone Encounter (Signed)
LMTCB- need to know if she would like to pick up or mailed to home address.

## 2017-04-11 NOTE — Telephone Encounter (Signed)
Letter is done. I was not aware she needed a letter.

## 2017-04-11 NOTE — Telephone Encounter (Signed)
ATC pt, no answer. Left message for pt to call back.  

## 2017-04-11 NOTE — Procedures (Signed)
Patient Name: Jenna Stokes, Jenna Stokes Date: 04/04/2017 Gender: Female D.O.B: 23-Sep-1965 Age (years): [age] Referring Provider: Jetty Duhamellinton Ardean Melroy MD, ABSM Height (inches): 69 Interpreting Physician: Jetty Duhamellinton Mysha Peeler MD, ABSM Weight (lbs): 342 RPSGT: Shelah LewandowskyGregory, Kenyon BMI: 50 MRN: [id number] Neck Size: 18.00 CLINICAL INFORMATION Sleep Stokes Type: NPSG  Indication for sleep Stokes: Diabetes, Hypertension, Obesity, OSA  Epworth Sleepiness Score: 4  SLEEP Stokes TECHNIQUE As per the AASM Manual for the Scoring of Sleep and Associated Events v2.3 (April 2016) with a hypopnea requiring 4% desaturations.  The channels recorded and monitored were frontal, central and occipital EEG, electrooculogram (EOG), submentalis EMG (chin), nasal and oral airflow, thoracic and abdominal wall motion, anterior tibialis EMG, snore microphone, electrocardiogram, and pulse oximetry.  MEDICATIONS Medications self-administered by patient taken the night of the Stokes : none reported  SLEEP ARCHITECTURE The Stokes was initiated at 10:09:58 PM and ended at 4:38:25 AM.  Sleep onset time was 32.4 minutes and the sleep efficiency was 75.7%. The total sleep time was 294.0 minutes.  Stage REM latency was 250.5 minutes.  The patient spent 7.65% of the night in stage N1 sleep, 85.88% in stage N2 sleep, 0.00% in stage N3 and 6.46% in REM.  Alpha intrusion was absent.  Supine sleep was 30.10%.  RESPIRATORY PARAMETERS The overall apnea/hypopnea index (AHI) was 4.7 per hour. There were 1 total apneas, including 1 obstructive, 0 central and 0 mixed apneas. There were 22 hypopneas and 16 RERAs.  The AHI during Stage REM sleep was 34.7 per hour.  AHI while supine was 0.7 per hour.  The mean oxygen saturation was 94.07%. The minimum SpO2 during sleep was 83.00%.  moderate snoring was noted during this Stokes.  CARDIAC DATA The 2 lead EKG demonstrated sinus rhythm. The mean heart rate was 59.16 beats per minute. Other EKG  findings include: PVCs.  LEG MOVEMENT DATA The total PLMS were 0 with a resulting PLMS index of 0.00. Associated arousal with leg movement index was 0.0 .  IMPRESSIONS - No significant obstructive sleep apnea occurred during this Stokes (AHI = 4.7/h). - No significant central sleep apnea occurred during this Stokes (CAI = 0.0/h). - Mild oxygen desaturation was noted during this Stokes (Min O2 = 83.00%, Mean 94%). - The patient snored with moderate snoring volume. - EKG findings include PVCs. - Clinically significant periodic limb movements did not occur during sleep. No significant associated arousals  DIAGNOSIS - Primary Snoring (786.09 [R06.83 ICD-10])  RECOMMENDATIONS - Be careful with alcohol, sedatives and other CNS depressants that may worsen sleep apnea and disrupt normal sleep architecture. - Sleep hygiene should be reviewed to assess factors that may improve sleep quality. - Weight management and regular exercise should be initiated or continued if appropriate.  [Electronically signed] 04/11/2017 02:22 PM  Jetty Duhamellinton Alexismarie Flaim MD, ABSM Diplomate, American Board of Sleep Medicine   NPI: [id number]

## 2017-04-11 NOTE — Telephone Encounter (Signed)
Spoke with pt, advised her of sleep study results and she needs a letter stating this so she can give it to the [employer] department. She is very upset because she said CY knew that she was needing this and we shouldve had the letter ready. CY can you write letter?

## 2017-04-11 NOTE — Telephone Encounter (Signed)
Her sleep study was within normal limits. She does not have obstructive sleep apnea. Her score was AHI 4.7/ hour and diagnosis of sleep apnea is made when the AHI is 5 or greater. She does have some apneas events, and would snore less and breathe better if she lost weight.  We can cancel her pending appointment, but would be happy to see her again if she needs us.

## 2017-04-15 NOTE — Telephone Encounter (Signed)
lmtcb X2 for pt to see if letter needs to be mailed or if she will pick up from office

## 2017-04-16 NOTE — Telephone Encounter (Signed)
Called and spoke with the pt  I advised that we have been trying to reach her to see if she wants us to mail the letter, or come pick up  She stated, " Wow, I have nothing to say" and hung up the phone  Being that I was unable to verify her address b/c she hung up, I will not mail the letter or leave up front

## 2017-06-23 ENCOUNTER — Ambulatory Visit: Payer: BC Managed Care – PPO | Admitting: Family Medicine

## 2017-06-30 ENCOUNTER — Ambulatory Visit: Payer: BC Managed Care – PPO | Admitting: Internal Medicine

## 2017-06-30 NOTE — Progress Notes (Signed)
HPI:  Jenna Stokes is a pleasant 52 y.o. here for follow up.  She has a history of poor compliance.  Chronic medical problems summarized below were reviewed for changes and stability and were updated as needed below. These issues and their treatment remain stable for the most part.  However, she thinks she has not been taking the glipizide.  Diet and exercise not any better.  She has not seen Dr. Dalbert Stokes yet.  She is overweight and is interested in seeing them, but she reports she will call on her own.  She reports she is taking all of her blood pressure medications.  However, on review of refills, it does not appear that she has been taking all of her blood pressure medications as prescribed.  Denies CP, SOB, DOE, treatment intolerance or new symptoms. She has a new concern of a swishing sound in her ear at times.  She has been using Q-tips in the ears.  Denies pain, dizziness, tinnitus, drainage from the ears. Due for labs, colon cancer screening, eye exam and flu shot But she has refused preventive measures in the past.  Diabetes: -Medications include metformin and glipizide  Hypertension: -Medications include amlodipine Coreg, hydrochlorothiazide, lisinopril and hydralazine -saw cardiologist for evaluation in the past  Hyperlipidemia/obesity: -Referred to Dr. Dalbert Stokes -Increased Crestor year -Occasions include Crestor  Obstructive sleep apnea: -Pulmonology for management  ROS: See pertinent positives and negatives per HPI.  Past Medical History:  Diagnosis Date  . 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 Hyperlipidemia   . Hypertension   . Morbid obesity (HCC)     Past Surgical History:  Procedure Laterality Date  . ABDOMINAL HYSTERECTOMY    . CESAREAN SECTION    . TONSILLECTOMY      Family History  Problem Relation Age of Onset  . Heart disease Mother   . Diabetes Mother   . Hypertension Sister   . Diabetes Sister     Social History    Socioeconomic History  . Marital status: Single    Spouse name: None  . Number of children: None  . Years of education: None  . Highest education level: None  Social Needs  . Financial resource strain: None  . Food insecurity - worry: None  . Food insecurity - inability: None  . Transportation needs - medical: None  . Transportation needs - non-medical: None  Occupational History  . None  Tobacco Use  . Smoking status: Never Smoker  . Smokeless tobacco: Never Used  Substance and Sexual Activity  . Alcohol use: No  . Drug use: No  . Sexual activity: None  Other Topics Concern  . 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  amLODipine (NORVASC) 10 MG tablet, Take 1 tablet (10 mg total) by mouth daily., Disp: 90 tablet, Rfl: 3 .  carvedilol (COREG) 12.5 MG tablet, Take 1 tablet (12.5 mg total) by mouth 2 (two) times daily with a meal., Disp: 180 tablet, Rfl: 3 .  glipiZIDE (GLUCOTROL) 5 MG tablet, Take 1 tablet (5 mg total) by mouth 2 (two) times daily before a meal., Disp: 120 tablet, Rfl: 3 .  hydrALAZINE (APRESOLINE) 25 MG  tablet, Take 1 tablet (25 mg total) by mouth 2 (two) times daily., Disp: 180 tablet, Rfl: 1 .  lisinopril (PRINIVIL,ZESTRIL) 30 MG tablet, Take 1 tablet (30 mg total) by mouth daily., Disp: 90 tablet, Rfl: 1 .  metFORMIN (GLUCOPHAGE-XR) 500 MG 24 hr tablet, Take 1 tablet (500 mg total) by mouth daily with breakfast., Disp: 60 tablet, Rfl: 1 .  chlorthalidone (HYGROTON) 25 MG tablet, Take 1 tablet (25 mg total) by mouth daily., Disp: 90 tablet, Rfl: 3 .  fluticasone (FLONASE) 50 MCG/ACT nasal spray, Place 2 sprays into both nostrils daily., Disp: 16 g, Rfl: 0 .  rosuvastatin (CRESTOR) 20 MG tablet, Take 1.5 tablets (30 mg total) by mouth daily., Disp: 45 tablet, Rfl:  2  EXAM:  Vitals:   07/01/17 1021  BP: (!) 148/82  Pulse: 63  Temp: 98.1 F (36.7 C)    Body mass index is 52.97 kg/m.  GENERAL: vitals reviewed and listed above, alert, oriented, appears well hydrated and in no acute distress  HEENT: atraumatic, conjunttiva clear, no obvious abnormalities on inspection of external nose and ears, normal appearance of ear canals and TMs except for clear effusions bilaterally with some wax packed onto part of the left tympanic membrane, clear nasal congestion, mild post oropharyngeal erythema with PND, no tonsillar edema or exudate, no sinus TTP  NECK: no obvious masses on inspection  LUNGS: clear to auscultation bilaterally, no wheezes, rales or rhonchi, good air movement  CV: HRRR, no peripheral edema  MS: moves all extremities without noticeable abnormality  PSYCH: pleasant and cooperative, no obvious depression or anxiety  ASSESSMENT AND PLAN:  Discussed the following assessment and plan:  Hypertension associated with diabetes (HCC) - Plan: Basic metabolic panel, CBC  Type 2 diabetes mellitus without complication, without long-term current use of insulin (HCC) - Plan: Hemoglobin A1c  Hyperlipidemia associated with type 2 diabetes mellitus (HCC)  Morbid obesity (HCC)  Obstructive sleep apnea  -Advised Flonase and over-the-counter eardrops for the ear issue, advised that she see an ear nose and throat doctor if this persists or worsens.  Pulsatile, though unsure from history. -She reports taking her blood pressure medications on a daily basis, we will go ahead and send chlorthalidone in place of hydrochlorothiazide to improve control, though I do believe she is not taking her medicines every day pills.  We have strongly advised her to take them daily and have warned her of risk of uncontrolled blood pressure and other problems. -We have referred her to the obesity clinic, will give her the information again today.  Spent a long time  talking about lifestyle changes and other options. -Reports she is not taking the glipizide.  She has told her she cannot tolerate higher doses of metformin.  Advised her to restart. -Patient advised to return or notify a doctor immediately if symptoms worsen or persist or new concerns arise.  Patient Instructions  BEFORE YOU LEAVE: -Colon cancer screening -advised  and see what she would like to do with this -flu shot -Dr. Francena [facility] clinic information -labs -follow up: 1-2 months to recheck blood pressure on new medication  STOP the Hydrochlorthiazide  START Chlorthalidone instead for blood pressure  Continue ALL of the other medications on your list from today.  Please, take all of your medicines every day as prescribed.  For the ears: flonase 2 sprays each nostril daily. NO qtips. Can use over the counter ear cleaning drops. If worsens or does not resolve in the next 2-3 weeks see  the the Ear, nose and throat specialist.  We have ordered labs or studies at this visit. It can take up to 1-2 weeks for results and processing. IF results require follow up or explanation, we will call you with instructions. Clinically stable results will be released to your [facility]. If you have not heard from [place] or cannot find your results in [facility] in 2 weeks please contact our office at [phone].  If you are not yet signed up for [facility], please consider signing up.   We recommend the following healthy lifestyle for LIFE: 1) Small portions. But, make sure to get regular (at least 3 per day), healthy meals and small healthy snacks if needed.  2) Eat a healthy clean diet.   TRY TO EAT: -at least 5-7 servings of low sugar, colorful, and nutrient rich vegetables per day (not corn, potatoes or bananas.) -berries are the best choice if you wish to eat fruit (only eat small amounts if trying to reduce weight)  -lean meets (fish, white meat of chicken or [place]) -vegan proteins for some meals - beans or  tofu, whole grains, nuts and seeds -Replace bad fats with good fats - good fats include: fish, nuts and seeds, canola oil, olive oil -small amounts of low fat or non fat dairy -small amounts of100 % whole grains - check the lables -drink plenty of water  AVOID: -SUGAR, sweets, anything with added sugar, corn syrup or sweeteners - must read labels as even foods advertised as "healthy" often are loaded with sugar -if you must have a sweetener, small amounts of stevia may be best -sweetened beverages and artificially sweetened beverages -simple starches (rice, bread, potatoes, pasta, chips, etc - small amounts of 100% whole grains are ok) -red meat, pork, butter -fried foods, fast food, processed food, excessive dairy, eggs and coconut.  3)Get at least 150 minutes of sweaty aerobic exercise per week.  4)Reduce stress - consider counseling, meditation and relaxation to balance other aspects of your life.        Terressa Koyanagi, DO

## 2017-07-01 ENCOUNTER — Other Ambulatory Visit: Payer: Self-pay | Admitting: Family Medicine

## 2017-07-01 ENCOUNTER — Ambulatory Visit: Payer: BC Managed Care – PPO | Admitting: Family Medicine

## 2017-07-01 ENCOUNTER — Encounter: Payer: Self-pay | Admitting: Family Medicine

## 2017-07-01 VITALS — BP 148/82 | HR 63 | Temp 98.1°F | Ht 69.0 in | Wt 358.7 lb

## 2017-07-01 DIAGNOSIS — E1169 Type 2 diabetes mellitus with other specified complication: Secondary | ICD-10-CM

## 2017-07-01 DIAGNOSIS — I152 Hypertension secondary to endocrine disorders: Secondary | ICD-10-CM

## 2017-07-01 DIAGNOSIS — E119 Type 2 diabetes mellitus without complications: Secondary | ICD-10-CM

## 2017-07-01 DIAGNOSIS — Z23 Encounter for immunization: Secondary | ICD-10-CM

## 2017-07-01 DIAGNOSIS — I10 Essential (primary) hypertension: Secondary | ICD-10-CM

## 2017-07-01 DIAGNOSIS — E785 Hyperlipidemia, unspecified: Secondary | ICD-10-CM

## 2017-07-01 DIAGNOSIS — E876 Hypokalemia: Secondary | ICD-10-CM | POA: Diagnosis not present

## 2017-07-01 DIAGNOSIS — G4733 Obstructive sleep apnea (adult) (pediatric): Secondary | ICD-10-CM

## 2017-07-01 DIAGNOSIS — E1159 Type 2 diabetes mellitus with other circulatory complications: Secondary | ICD-10-CM | POA: Diagnosis not present

## 2017-07-01 LAB — CBC
HEMATOCRIT: 39 % (ref 36.0–46.0)
Hemoglobin: 13.2 g/dL (ref 12.0–15.0)
MCHC: 33.9 g/dL (ref 30.0–36.0)
MCV: 88 fl (ref 78.0–100.0)
PLATELETS: 209 10*3/uL (ref 150.0–400.0)
RBC: 4.43 Mil/uL (ref 3.87–5.11)
RDW: 13 % (ref 11.5–15.5)
WBC: 7.9 10*3/uL (ref 4.0–10.5)

## 2017-07-01 LAB — HEMOGLOBIN A1C: HEMOGLOBIN A1C: 7.9 % — AB (ref 4.6–6.5)

## 2017-07-01 LAB — BASIC METABOLIC PANEL
BUN: 17 mg/dL (ref 6–23)
CHLORIDE: 98 meq/L (ref 96–112)
CO2: 32 meq/L (ref 19–32)
Calcium: 10 mg/dL (ref 8.4–10.5)
Creatinine, Ser: 0.91 mg/dL (ref 0.40–1.20)
GFR: 83.7 mL/min (ref >60.00)
Glucose, Bld: 183 mg/dL — ABNORMAL HIGH (ref 70–99)
POTASSIUM: 3.1 meq/L — AB (ref 3.5–5.1)
SODIUM: 138 meq/L (ref 135–145)

## 2017-07-01 MED ORDER — METFORMIN HCL ER 500 MG PO TB24: 500.0000 mg | ORAL_TABLET | Freq: Every day | ORAL | 1 refills | Status: DC

## 2017-07-01 MED ORDER — AMLODIPINE BESYLATE 10 MG PO TABS: 10.0000 mg | ORAL_TABLET | Freq: Every day | ORAL | 3 refills | Status: DC

## 2017-07-01 MED ORDER — GLIPIZIDE 5 MG PO TABS: 5.0000 mg | ORAL_TABLET | Freq: Two times a day (BID) | ORAL | 3 refills | Status: DC

## 2017-07-01 MED ORDER — FLUTICASONE PROPIONATE 50 MCG/ACT NA SUSP: 2.0000 | Freq: Every day | NASAL | 0 refills | Status: DC

## 2017-07-01 MED ORDER — CARVEDILOL 12.5 MG PO TABS: 12.5000 mg | ORAL_TABLET | Freq: Two times a day (BID) | ORAL | 3 refills | Status: DC

## 2017-07-01 MED ORDER — POTASSIUM CHLORIDE CRYS ER 20 MEQ PO TBCR: 20.0000 meq | EXTENDED_RELEASE_TABLET | Freq: Every day | ORAL | 3 refills | Status: DC

## 2017-07-01 MED ORDER — CHLORTHALIDONE 25 MG PO TABS: 25.0000 mg | ORAL_TABLET | Freq: Every day | ORAL | 3 refills | Status: DC

## 2017-07-01 NOTE — Addendum Note (Signed)
Addended by: Johnella MoloneyFUNDERBURK, JO A on: 07/01/2017 11:29 AM   Modules accepted: Orders

## 2017-07-01 NOTE — Patient Instructions (Addendum)
BEFORE YOU LEAVE: -Colon cancer screening -advised  and see what she would like to do with this -flu shot -Dr. Francena [facility]lyBeasley's clinic information -labs -follow up: 1-2 months to recheck blood pressure on new medication  STOP the Hydrochlorthiazide  START Chlorthalidone instead for blood pressure  Continue ALL of the other medications on your list from today.  Please, take all of your medicines every day as prescribed.  For the ears: flonase 2 sprays each nostril daily. NO qtips. Can use over the counter ear cleaning drops. If worsens or does not resolve in the next 2-3 weeks see the the Ear, nose and throat specialist.  We have ordered labs or studies at this visit. It can take up to 1-2 weeks for results and processing. IF results require follow up or explanation, we will call you with instructions. Clinically stable results will be released to your [facility]. If you have not heard from us or cannot find your results in [facility] in 2 weeks please contact our office at [phone].  If you are not yet [facility] for [facility], please consider signing up.   We recommend the following healthy lifestyle for LIFE: 1) Small portions. But, make sure to get regular (at least 3 per day), healthy meals and small healthy snacks if needed.  2) Eat a healthy clean diet.   TRY TO EAT: -at least 5-7 servings of low sugar, colorful, and nutrient rich vegetables per day (not corn, potatoes or bananas.) -berries are the best choice if you wish to eat fruit (only eat small amounts if trying to reduce weight)  -lean meets (fish, white meat of chicken or [place]) -vegan proteins for some meals - beans or tofu, whole grains, nuts and seeds -Replace bad fats with good fats - good fats include: fish, nuts and seeds, canola oil, olive oil -small amounts of low fat or non fat dairy -small amounts of100 % whole grains - check the lables -drink plenty of water  AVOID: -SUGAR, sweets, anything with added sugar, corn  syrup or sweeteners - must read labels as even foods advertised as "healthy" often are loaded with sugar -if you must have a sweetener, small amounts of stevia may be best -sweetened beverages and artificially sweetened beverages -simple starches (rice, bread, potatoes, pasta, chips, etc - small amounts of 100% whole grains are ok) -red meat, pork, butter -fried foods, fast food, processed food, excessive dairy, eggs and coconut.  3)Get at least 150 minutes of sweaty aerobic exercise per week.  4)Reduce stress - consider counseling, meditation and relaxation to balance other aspects of your life.

## 2017-07-14 NOTE — Addendum Note (Signed)
Addended by: Johnella MoloneyFUNDERBURK, JO A on: 07/14/2017 04:52 PM   Modules accepted: Orders

## 2017-08-04 ENCOUNTER — Other Ambulatory Visit: Payer: BC Managed Care – PPO

## 2017-08-06 ENCOUNTER — Other Ambulatory Visit (INDEPENDENT_AMBULATORY_CARE_PROVIDER_SITE_OTHER): Payer: BC Managed Care – PPO

## 2017-08-06 DIAGNOSIS — E876 Hypokalemia: Secondary | ICD-10-CM

## 2017-08-06 LAB — BASIC METABOLIC PANEL
BUN: 14 mg/dL (ref 6–23)
CHLORIDE: 103 meq/L (ref 96–112)
CO2: 31 mEq/L (ref 19–32)
CREATININE: 0.86 mg/dL (ref 0.40–1.20)
Calcium: 10.2 mg/dL (ref 8.4–10.5)
GFR: 89.31 mL/min (ref >60.00)
GLUCOSE: 71 mg/dL (ref 70–99)
Potassium: 3.8 mEq/L (ref 3.5–5.1)
Sodium: 139 mEq/L (ref 135–145)

## 2017-09-01 ENCOUNTER — Ambulatory Visit: Payer: BC Managed Care – PPO | Admitting: Family Medicine

## 2017-09-18 ENCOUNTER — Ambulatory Visit: Payer: BC Managed Care – PPO | Admitting: Family Medicine

## 2018-01-08 ENCOUNTER — Other Ambulatory Visit: Payer: Self-pay

## 2018-01-08 ENCOUNTER — Inpatient Hospital Stay (HOSPITAL_COMMUNITY)
Admission: EM | Admit: 2018-01-08 | Discharge: 2018-01-12 | DRG: 305 | Disposition: A | Discharge status: Home / Self Care | Payer: BC Managed Care – PPO | Source: Ambulatory Visit | Attending: Family Medicine | Admitting: Family Medicine

## 2018-01-08 ENCOUNTER — Encounter (HOSPITAL_COMMUNITY): Payer: Self-pay | Admitting: Emergency Medicine

## 2018-01-08 ENCOUNTER — Encounter: Payer: Self-pay | Admitting: Family Medicine

## 2018-01-08 ENCOUNTER — Emergency Department (HOSPITAL_COMMUNITY): Payer: BC Managed Care – PPO

## 2018-01-08 ENCOUNTER — Ambulatory Visit: Payer: BC Managed Care – PPO | Admitting: Family Medicine

## 2018-01-08 VITALS — BP 190/125 | HR 72 | Temp 98.9°F | Ht 69.0 in | Wt 366.0 lb

## 2018-01-08 DIAGNOSIS — E119 Type 2 diabetes mellitus without complications: Secondary | ICD-10-CM

## 2018-01-08 DIAGNOSIS — I160 Hypertensive urgency: Secondary | ICD-10-CM | POA: Diagnosis not present

## 2018-01-08 DIAGNOSIS — Z9114 Patient's other noncompliance with medication regimen: Secondary | ICD-10-CM

## 2018-01-08 DIAGNOSIS — E1100 Type 2 diabetes mellitus with hyperosmolarity without nonketotic hyperglycemic-hyperosmolar coma (NKHHC): Secondary | ICD-10-CM

## 2018-01-08 DIAGNOSIS — R51 Headache: Secondary | ICD-10-CM | POA: Diagnosis present

## 2018-01-08 DIAGNOSIS — Z6841 Body Mass Index (BMI) 40.0 and over, adult: Secondary | ICD-10-CM

## 2018-01-08 DIAGNOSIS — Z833 Family history of diabetes mellitus: Secondary | ICD-10-CM

## 2018-01-08 DIAGNOSIS — R0789 Other chest pain: Secondary | ICD-10-CM

## 2018-01-08 DIAGNOSIS — R0600 Dyspnea, unspecified: Secondary | ICD-10-CM | POA: Diagnosis not present

## 2018-01-08 DIAGNOSIS — K5900 Constipation, unspecified: Secondary | ICD-10-CM | POA: Diagnosis present

## 2018-01-08 DIAGNOSIS — Z8249 Family history of ischemic heart disease and other diseases of the circulatory system: Secondary | ICD-10-CM

## 2018-01-08 DIAGNOSIS — I119 Hypertensive heart disease without heart failure: Secondary | ICD-10-CM | POA: Diagnosis present

## 2018-01-08 DIAGNOSIS — Z7984 Long term (current) use of oral hypoglycemic drugs: Secondary | ICD-10-CM

## 2018-01-08 DIAGNOSIS — I340 Nonrheumatic mitral (valve) insufficiency: Secondary | ICD-10-CM | POA: Diagnosis not present

## 2018-01-08 DIAGNOSIS — R0602 Shortness of breath: Secondary | ICD-10-CM

## 2018-01-08 DIAGNOSIS — R911 Solitary pulmonary nodule: Secondary | ICD-10-CM | POA: Diagnosis not present

## 2018-01-08 DIAGNOSIS — R112 Nausea with vomiting, unspecified: Secondary | ICD-10-CM | POA: Diagnosis present

## 2018-01-08 DIAGNOSIS — Z90710 Acquired absence of both cervix and uterus: Secondary | ICD-10-CM | POA: Diagnosis not present

## 2018-01-08 DIAGNOSIS — E785 Hyperlipidemia, unspecified: Secondary | ICD-10-CM | POA: Diagnosis present

## 2018-01-08 DIAGNOSIS — IMO0002 Reserved for concepts with insufficient information to code with codable children: Secondary | ICD-10-CM

## 2018-01-08 DIAGNOSIS — I161 Hypertensive emergency: Principal | ICD-10-CM | POA: Diagnosis present

## 2018-01-08 DIAGNOSIS — E1165 Type 2 diabetes mellitus with hyperglycemia: Secondary | ICD-10-CM

## 2018-01-08 DIAGNOSIS — E876 Hypokalemia: Secondary | ICD-10-CM | POA: Diagnosis present

## 2018-01-08 DIAGNOSIS — I10 Essential (primary) hypertension: Secondary | ICD-10-CM

## 2018-01-08 DIAGNOSIS — D649 Anemia, unspecified: Secondary | ICD-10-CM | POA: Diagnosis present

## 2018-01-08 DIAGNOSIS — I2510 Atherosclerotic heart disease of native coronary artery without angina pectoris: Secondary | ICD-10-CM | POA: Diagnosis present

## 2018-01-08 DIAGNOSIS — R072 Precordial pain: Secondary | ICD-10-CM | POA: Diagnosis not present

## 2018-01-08 DIAGNOSIS — R079 Chest pain, unspecified: Secondary | ICD-10-CM | POA: Diagnosis not present

## 2018-01-08 LAB — BASIC METABOLIC PANEL
Anion gap: 10 (ref 5–15)
BUN: 12 mg/dL (ref 6–20)
CO2: 30 mmol/L (ref 22–32)
CREATININE: 0.91 mg/dL (ref 0.44–1.00)
Calcium: 9.6 mg/dL (ref 8.9–10.3)
Chloride: 100 mmol/L (ref 98–111)
GFR calc non Af Amer: >60 mL/min (ref >60)
Glucose, Bld: 118 mg/dL — ABNORMAL HIGH (ref 70–99)
Potassium: 3 mmol/L — ABNORMAL LOW (ref 3.5–5.1)
Sodium: 140 mmol/L (ref 135–145)

## 2018-01-08 LAB — I-STAT TROPONIN, ED: Troponin i, poc: 0.04 ng/mL (ref 0.00–0.08)

## 2018-01-08 LAB — CBC WITH DIFFERENTIAL/PLATELET
Abs Immature Granulocytes: 0.1 10*3/uL (ref 0.0–0.1)
Basophils Absolute: 0.1 10*3/uL (ref 0.0–0.1)
Basophils Relative: 1 %
EOS ABS: 0.4 10*3/uL (ref 0.0–0.7)
EOS PCT: 3 %
HCT: 39.8 % (ref 36.0–46.0)
Hemoglobin: 12.7 g/dL (ref 12.0–15.0)
IMMATURE GRANULOCYTES: 0 %
Lymphocytes Relative: 18 %
Lymphs Abs: 2.1 10*3/uL (ref 0.7–4.0)
MCH: 29.3 pg (ref 26.0–34.0)
MCHC: 31.9 g/dL (ref 30.0–36.0)
MCV: 91.7 fL (ref 78.0–100.0)
Monocytes Absolute: 0.7 10*3/uL (ref 0.1–1.0)
Monocytes Relative: 6 %
NEUTROS PCT: 72 %
Neutro Abs: 8.3 10*3/uL — ABNORMAL HIGH (ref 1.7–7.7)
PLATELETS: 216 10*3/uL (ref 150–400)
RBC: 4.34 MIL/uL (ref 3.87–5.11)
RDW: 12.7 % (ref 11.5–15.5)
WBC: 11.6 10*3/uL — AB (ref 4.0–10.5)

## 2018-01-08 LAB — BRAIN NATRIURETIC PEPTIDE: B NATRIURETIC PEPTIDE 5: 137.2 pg/mL — AB (ref 0.0–100.0)

## 2018-01-08 LAB — D-DIMER, QUANTITATIVE: D-Dimer, Quant: 0.99 ug/mL-FEU — ABNORMAL HIGH (ref 0.00–0.50)

## 2018-01-08 LAB — TROPONIN I: TROPONIN I: 0.04 ng/mL — AB (ref <0.03)

## 2018-01-08 MED ORDER — HYDRALAZINE HCL 25 MG PO TABS
25.0000 mg | ORAL_TABLET | Freq: Once | ORAL | Status: AC
  Administered 2018-01-08: 25 mg via ORAL
  Filled 2018-01-08: qty 1

## 2018-01-08 MED ORDER — IOPAMIDOL (ISOVUE-370) INJECTION 76%
100.0000 mL | Freq: Once | INTRAVENOUS | Status: AC | PRN
  Administered 2018-01-08: 100 mL via INTRAVENOUS

## 2018-01-08 MED ORDER — MORPHINE SULFATE (PF) 2 MG/ML IV SOLN
0.5000 mg | INTRAVENOUS | Status: DC | PRN
  Administered 2018-01-09: 0.5 mg via INTRAVENOUS
  Filled 2018-01-08: qty 1

## 2018-01-08 MED ORDER — SODIUM CHLORIDE 0.9% FLUSH: 3.0000 mL | INTRAVENOUS | Status: DC | PRN

## 2018-01-08 MED ORDER — NITROGLYCERIN IN D5W 200-5 MCG/ML-% IV SOLN
0.0000 ug/min | INTRAVENOUS | Status: DC
  Administered 2018-01-08: 5 ug/min via INTRAVENOUS
  Administered 2018-01-09: 30 ug/min via INTRAVENOUS
  Filled 2018-01-08: qty 250

## 2018-01-08 MED ORDER — IOPAMIDOL (ISOVUE-370) INJECTION 76%
INTRAVENOUS | Status: AC
  Filled 2018-01-08: qty 100

## 2018-01-08 MED ORDER — INSULIN ASPART 100 UNIT/ML ~~LOC~~ SOLN
0.0000 [IU] | SUBCUTANEOUS | Status: DC
  Administered 2018-01-09: 3 [IU] via SUBCUTANEOUS
  Administered 2018-01-09: 1 [IU] via SUBCUTANEOUS
  Administered 2018-01-09: 3 [IU] via SUBCUTANEOUS
  Administered 2018-01-09: 2 [IU] via SUBCUTANEOUS
  Administered 2018-01-09 (×2): 1 [IU] via SUBCUTANEOUS
  Administered 2018-01-10: 2 [IU] via SUBCUTANEOUS
  Administered 2018-01-10 (×2): 1 [IU] via SUBCUTANEOUS
  Administered 2018-01-10: 2 [IU] via SUBCUTANEOUS

## 2018-01-08 MED ORDER — LABETALOL HCL 5 MG/ML IV SOLN: 10.0000 mg | Freq: Once | INTRAVENOUS | Status: DC

## 2018-01-08 MED ORDER — LISINOPRIL 10 MG PO TABS
10.0000 mg | ORAL_TABLET | Freq: Once | ORAL | Status: AC
  Administered 2018-01-08: 10 mg via ORAL
  Filled 2018-01-08: qty 1

## 2018-01-08 MED ORDER — HYDRALAZINE HCL 20 MG/ML IJ SOLN
10.0000 mg | Freq: Four times a day (QID) | INTRAMUSCULAR | Status: DC | PRN
  Administered 2018-01-08: 10 mg via INTRAVENOUS
  Filled 2018-01-08: qty 1

## 2018-01-08 MED ORDER — SODIUM CHLORIDE 0.9% FLUSH
3.0000 mL | Freq: Two times a day (BID) | INTRAVENOUS | Status: DC
  Administered 2018-01-09 – 2018-01-12 (×7): 3 mL via INTRAVENOUS

## 2018-01-08 MED ORDER — ENOXAPARIN SODIUM 80 MG/0.8ML ~~LOC~~ SOLN
80.0000 mg | SUBCUTANEOUS | Status: DC
  Administered 2018-01-09 – 2018-01-11 (×3): 80 mg via SUBCUTANEOUS
  Filled 2018-01-08 (×4): qty 0.8

## 2018-01-08 MED ORDER — SODIUM CHLORIDE 0.9 % IV SOLN: 250.0000 mL | INTRAVENOUS | Status: DC | PRN

## 2018-01-08 MED ORDER — ACETAMINOPHEN 650 MG RE SUPP: 650.0000 mg | Freq: Four times a day (QID) | RECTAL | Status: DC | PRN

## 2018-01-08 MED ORDER — ACETAMINOPHEN 325 MG PO TABS
650.0000 mg | ORAL_TABLET | Freq: Four times a day (QID) | ORAL | Status: DC | PRN
  Administered 2018-01-08 – 2018-01-12 (×6): 650 mg via ORAL
  Filled 2018-01-08 (×6): qty 2

## 2018-01-08 MED ORDER — SODIUM CHLORIDE 0.9 % IV SOLN: INTRAVENOUS | Status: DC

## 2018-01-08 MED ORDER — POTASSIUM CHLORIDE CRYS ER 20 MEQ PO TBCR
40.0000 meq | EXTENDED_RELEASE_TABLET | Freq: Once | ORAL | Status: AC
  Administered 2018-01-08: 40 meq via ORAL
  Filled 2018-01-08: qty 2

## 2018-01-08 NOTE — ED Provider Notes (Addendum)
[facility] EMERGENCY DEPARTMENT Provider Note   CSN: [id number] Arrival date & time: 01/08/18  1552     History   Chief Complaint Chief Complaint  Patient presents with  . Shortness of Breath  . Hypertension    HPI Jenna Stokes is a 51 y.o. female who presents with SOB and chest tightness. PMH significant for obesity, DM, HTN, HLD. Over the past week she has had persistent SOB and chest tightness. She reports SOB with activity and has had to sleep propped up because she feel SOB when she lies flat and will sometimes wheeze. She also she sometimes feels a "thumping" in her chest when she exerts herself - she denies these are palpitations. She has had these symptoms before intermittently but it usually goes away. This time it has been persistent so she went to her PCP who referred her to the ED. She states that she has not been taking her medicines as prescribed because when she takes them it makes her feel bad although she has been taking them for the past 2 weeks. She denies lightheadedness, syncope, chest pain, cough, fever, abdominal pain, N/V, leg swelling. No recent surgery/travel/immobilization, hx of cancer, leg swelling, hemoptysis, prior DVT/PE, or hormone use. She has had a prior cardiac work up with ST and Echo which showed EF 75% in 2017. She was supposed to have a cardiac MRI done and states she did have this done however I do not see record of this.  HPI  Past Medical History:  Diagnosis Date  . 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 Hyperlipidemia   . Hypertension   . Morbid obesity [facility])     Patient Active Problem List   Diagnosis Date Noted  . Obstructive sleep apnea 03/20/2017  . Hyperlipidemia associated with type 2 diabetes mellitus (HCC) 02/18/2017  . Type 2 diabetes mellitus without complication, without long-term current use of insulin (HCC) 04/12/2016  . Hypertension associated with diabetes (HCC) 08/19/2013    . Morbid obesity (HCC)     Past Surgical History:  Procedure Laterality Date  . ABDOMINAL HYSTERECTOMY    . CESAREAN SECTION    . TONSILLECTOMY       OB History   None      Home Medications    Prior to Admission medications   Medication Sig Start Date End Date Taking? Authorizing Provider  amLODipine (NORVASC) 10 MG tablet Take 1 tablet (10 mg total) by mouth daily. 07/01/17   Terressa Koyanagi, DO  carvedilol (COREG) 12.5 MG tablet Take 1 tablet (12.5 mg total) by mouth 2 (two) times daily with a meal. 07/01/17   Terressa Koyanagi, DO  chlorthalidone (HYGROTON) 25 MG tablet Take 1 tablet (25 mg total) by mouth daily. 07/01/17   Terressa Koyanagi, DO  fluticasone (FLONASE) 50 MCG/ACT nasal spray Place 2 sprays into both nostrils daily. 07/01/17   Terressa Koyanagi, DO  glipiZIDE (GLUCOTROL) 5 MG tablet Take 1 tablet (5 mg total) by mouth 2 (two) times daily before a meal. 07/01/17   Terressa Koyanagi, DO  hydrALAZINE (APRESOLINE) 25 MG tablet Take 1 tablet (25 mg total) by mouth 2 (two) times daily. 07/11/16   Terressa Koyanagi, DO  lisinopril (PRINIVIL,ZESTRIL) 30 MG tablet Take 1 tablet (30 mg total) by mouth daily. 03/31/17   Terressa Koyanagi, DO  metFORMIN (GLUCOPHAGE-XR) 500 MG 24 hr tablet Take 1 tablet (500 mg total) by mouth daily with breakfast. 07/01/17  Terressa Koyanagi, DO  potassium chloride SA (K-DUR,KLOR-CON) 20 MEQ tablet Take 1 tablet (20 mEq total) by mouth daily. 07/01/17   Terressa Koyanagi, DO  rosuvastatin (CRESTOR) 20 MG tablet Take 1.5 tablets (30 mg total) by mouth daily. 02/21/17 05/22/17  Terressa Koyanagi, DO    Family History Family History  Problem Relation Age of Onset  . Heart disease Mother   . Diabetes Mother   . Hypertension Sister   . Diabetes Sister     Social History Social History   Tobacco Use  . Smoking status: Never Smoker  . Smokeless tobacco: Never Used  Substance Use Topics  . Alcohol use: No  . Drug use: No     Allergies   Penicillins   Review of  Systems Review of Systems  Constitutional: Negative for fever.  Respiratory: Positive for chest tightness, shortness of breath and wheezing. Negative for cough.   Cardiovascular: Negative for chest pain, palpitations and leg swelling.  Gastrointestinal: Negative for abdominal pain, nausea and vomiting.  Neurological: Negative for syncope and light-headedness.  All other systems reviewed and are negative.    Physical Exam Updated Vital Signs BP (!) 212/108   Pulse 72   Temp 98.4 F (36.9 C) (Oral)   Resp (!) 22   Ht 5\' 10"  (1.778 m)   Wt (!) 166 kg   LMP 08/10/2006   SpO2 100%   BMI 52.52 kg/m   Physical Exam  Constitutional: She is oriented to person, place, and time. She appears well-developed and well-nourished. No distress.  Calm, cooperative. Morbidly obese  HENT:  Head: Normocephalic and atraumatic.  Eyes: Pupils are equal, round, and reactive to light. Conjunctivae are normal. Right eye exhibits no discharge. Left eye exhibits no discharge. No scleral icterus.  Neck: Normal range of motion.  Cardiovascular: Normal rate and regular rhythm.  Pulmonary/Chest: Effort normal and breath sounds normal. Tachypnea (mild) noted. No respiratory distress.  Abdominal: Soft. Bowel sounds are normal. She exhibits no distension. There is no tenderness.  Musculoskeletal:  Trace peripheral edema or calf tenderness  Neurological: She is alert and oriented to person, place, and time.  Skin: Skin is warm and dry.  Psychiatric: She has a normal mood and affect. Her behavior is normal.  Nursing note and vitals reviewed.    ED Treatments / Results  Labs (all labs ordered are listed, but only abnormal results are displayed) Labs Reviewed  BASIC METABOLIC PANEL - Abnormal; Notable for the following components:      Result Value   Potassium 3.0 (*)    Glucose, Bld 118 (*)    All other components within normal limits  CBC WITH DIFFERENTIAL/PLATELET - Abnormal; Notable for the following  components:   WBC 11.6 (*)    Neutro Abs 8.3 (*)    All other components within normal limits  BRAIN NATRIURETIC PEPTIDE - Abnormal; Notable for the following components:   B Natriuretic Peptide 137.2 (*)    All other components within normal limits  D-DIMER, QUANTITATIVE (NOT AT [facility]) - Abnormal; Notable for the following components:   D-Dimer, Quant 0.99 (*)    All other components within normal limits  I-STAT TROPONIN, ED    EKG EKG Interpretation  Date/Time:  Thursday January 08 2018 16:02:20 EDT Ventricular Rate:  67 PR Interval:    QRS Duration: 98 QT Interval:  416 QTC Calculation: 440 R Axis:   -10 Text Interpretation:  Sinus rhythm Atrial premature complexes in couplets Probable left atrial enlargement  LVH with secondary repolarization abnormality Anterior ST elevation, probably due to LVH Baseline wander in lead(s) I III aVL aVF V2 V3 V4 V5 V6 has changed Interpretation limited secondary to artifact Confirmed by Vanetta Mulders [phone]) on 01/08/2018 4:07:18 PM   Radiology Dg Chest 2 View  Result Date: 01/08/2018 CLINICAL DATA:  CP, SOB since Monday. Hx of hypertension, diabetes. Nonsmoker. EXAM: CHEST - 2 VIEW COMPARISON:  Chest x-ray dated 07/11/2016. FINDINGS: Stable cardiomegaly. Lungs are clear. No pleural effusion or pneumothorax seen. Osseous structures are unremarkable. IMPRESSION: Stable cardiomegaly. No active cardiopulmonary disease. No evidence of pneumonia or pulmonary edema. Electronically Signed   By: Bary Richard M.D.   On: 01/08/2018 16:54   Ct Angio Chest Pe W/cm &/or Wo Cm  Result Date: 01/08/2018 CLINICAL DATA:  Hypertension and dyspnea since Monday. EXAM: CT ANGIOGRAPHY CHEST WITH CONTRAST TECHNIQUE: Multidetector CT imaging of the chest was performed using the standard protocol during bolus administration of intravenous contrast. Multiplanar CT image reconstructions and MIPs were obtained to evaluate the vascular anatomy. CONTRAST:  80 cc ISOVUE-370  IOPAMIDOL (ISOVUE-370) INJECTION 76% COMPARISON:  CXR 01/08/2018 FINDINGS: Cardiovascular: The study is of quality for the evaluation of pulmonary embolism. There are no filling defects in the central, lobar, segmental or subsegmental pulmonary artery branches to suggest acute pulmonary embolism. Great vessels are normal in course and caliber. Mild cardiomegaly without pericardial effusion. Slightly ectatic thoracic aorta measuring up to 3.8 cm along the ascending portion. No dissection though study is tailored for the pulmonary arterial system. Coronary arteriosclerosis along the LAD. Mediastinum/Nodes: No discrete thyroid nodules. Unremarkable esophagus. No pathologically enlarged axillary, mediastinal or hilar lymph nodes. Lungs/Pleura: No pneumothorax. Trace bilateral pleural fluid with ground-glass opacities within the lungs possibly representing hypoventilatory change, alveolitis or pneumonitis or possibly stigmata of mild pulmonary edema. Nodular opacity in the lateral segment of right middle lobe measuring 5.1 mm in average. Upper abdomen: Unremarkable. Musculoskeletal: No aggressive appearing focal osseous lesions. Degenerative change along the dorsal spine. Review of the MIP images confirms the above findings. IMPRESSION: 1. Coronary arteriosclerosis. 2. 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  No dissection. 3. No acute pulmonary embolus. 4. 5.1 mm right middle lobe nodule. Non-contrast chest CT at 6-12 months is recommended. If the nodule is stable at time of repeat CT, then future CT at 18-24 months (from today's scan) is considered optional for low-risk patients, but is recommended for high-risk patients. This recommendation follows the consensus statement: Guidelines for Management of Incidental Pulmonary Nodules Detected on CT Images: From the Fleischner Society 2017; Radiology 2017; 284:228-243. Aortic Atherosclerosis (ICD10-I70.0). Electronically Signed   By: Tollie Eth M.D.   On: 01/08/2018  20:18    Procedures Procedures (including critical care time)  CRITICAL CARE Performed by: Bethel Born   Total critical care time: 30 minutes  Critical care time was exclusive of separately billable procedures and treating other patients.  Critical care was necessary to treat or prevent imminent or life-threatening deterioration.  Critical care was time spent personally by me on the following activities: development of treatment plan with patient and/or surrogate as well as nursing, discussions with consultants, evaluation of patient's response to treatment, examination of patient, obtaining history from patient or surrogate, ordering and performing treatments and interventions, ordering and review of laboratory studies, ordering and review of radiographic studies, pulse oximetry and re-evaluation of patient's condition.   Medications Ordered in ED Medications  iopamidol (ISOVUE-370) 76 % injection (has no administration in time range)  hydrALAZINE (APRESOLINE) tablet 25 mg (25 mg Oral Given 01/08/18 1751)  lisinopril (PRINIVIL,ZESTRIL) tablet 10 mg (10 mg Oral Given 01/08/18 1751)  potassium chloride SA (K-DUR,KLOR-CON) CR tablet 40 mEq (40 mEq Oral Given 01/08/18 1751)  iopamidol (ISOVUE-370) 76 % injection 100 mL (100 mLs Intravenous Contrast Given 01/08/18 1956)     Initial Impression / Assessment and Plan / ED Course  I have reviewed the triage vital signs and the nursing notes.  Pertinent labs & imaging results that were available during my care of the patient were reviewed by me and considered in my medical decision making (see chart for details).  52 year old female presents with significantly elevated hypertension, shortness of breath and chest tightness over the past week.  Her blood pressures have been persistently over 200 systolic.  Her other vital signs are normal.  Initial EKG shows T wave inversions in the lateral leads which appears new.  Her chest x-ray is negative.   Her blood work is overall unremarkable other than BNP being slightly elevated.  D-dimer was elevated therefore CTA of the chest was obtained to rule out PE.  This was negative.  She was given her home medicines and potassium was replaced however her blood pressures remained persistently elevated and she continued to complain of shortness of breath and chest tightness.  Discussed with Dr. Selena BattenKim with triad who will admit.  Final Clinical Impressions(s) / ED Diagnoses   Final diagnoses:  Hypertensive urgency  Chest tightness  Dyspnea, unspecified type    ED Discharge Orders    None       Beryle QuantGekas, Tehillah Cipriani Marie, PA-C 01/08/18 2046    Vanetta MuldersZackowski, Scott, MD 01/10/18 0742    Bethel BornGekas, Gemayel Mascio Marie, PA-C 01/22/18 Lacretia [id number]    Vanetta MuldersZackowski, Scott, MD 02/01/18 1729

## 2018-01-08 NOTE — Progress Notes (Signed)
CRITICAL VALUE ALERT  Critical Value:  Troponin 0.04  Date & Time Notied:  01/08/18  22:53  Provider Notified: Schorr  Orders Received/Actions taken:

## 2018-01-08 NOTE — H&P (Addendum)
TRH H&P   Patient Demographics:    Jenna Stokes, is a 52 y.o. female  MRN: [id number]   DOB - 18-Oct-1965  Admit Date - 01/08/2018  Outpatient Primary MD for the patient is Terressa Koyanagi, DO  Referring MD/NP/PA:  Terance Hart  Outpatient Specialists:  Jetty Duhamel (sleep) Leodis Sias (cardiology)  Patient coming from: home  Chief Complaint  Patient presents with  . Shortness of Breath  . Hypertension      HPI:    Jenna Stokes  is a 52 y.o. female, w hypertension, hyperlipidemia, Dm2, morbid obeisity BMI 52.4, h/o remote cath in 2010 => negative,  Mild mitral regurgitation ,  apparently presents with c/o chest pain for the past 2 weeks, worse today, substernal / epigastric, chest tightness without radiation. + sob, denies fever, chills, cough, palp, n/v, heartburn, diarrhea, brbpr,     In ED,  CTA chest IMPRESSION: 1. Coronary arteriosclerosis. 2. 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  No dissection. 3. No acute pulmonary embolus. 4. 5.1 mm right middle lobe nodule. Non-contrast chest CT at 6-12 months is recommended. If the nodule is stable at time of repeat CT,then future CT at 18-24 months (from today's scan) is considered optional for low-risk patients, but is recommended for high-risk patients. This recommendation follows the consensus statement: Guidelines for Management of Incidental Pulmonary Nodules Detected on CT Images: From the Fleischner Society 2017; Radiology 2017; 284:228-243.   D dimer 0.99 Trop 0.04 BNP 137.2 Wbc 11.6, hgb 12.7, Plt 216 Na 140, K 3.0, Bun 12, Creatinine 0.91  Ekg nsr at 67, nl axis, q in v1, v2, t inversion in 1, avl, v4-6   Pt will be admitted for evaluation of chest pain, hypertensive emergency and also lung nodule.     Review of systems:    In addition to the HPI above,    No Fever-chills, No Headache, No  changes with Vision or hearing, No problems swallowing food or Liquids, No Cough  No Abdominal pain, No Nausea or Vommitting, Bowel movements are regular, No Blood in stool or Urine, No dysuria, No new skin rashes or bruises, No new joints pains-aches,  No new weakness, tingling, numbness in any extremity, No recent weight gain or loss, No polyuria, polydypsia or polyphagia, No significant Mental Stressors.  A full 10 point Review of Systems was done, except as stated above, all other Review of Systems were negative.   With Past History of the following :    Past Medical History:  Diagnosis Date  . 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 Hyperlipidemia   . Hypertension   . Morbid obesity (HCC)       Past Surgical History:  Procedure Laterality Date  . ABDOMINAL HYSTERECTOMY    . CESAREAN SECTION    . TONSILLECTOMY        Social History:  Social History   Tobacco Use  . Smoking status: Never Smoker  . Smokeless tobacco: Never Used  Substance Use Topics  . Alcohol use: No     Lives - at home  Mobility - walks by self   Family History :     Family History  Problem Relation Age of Onset  . Heart disease Mother   . Diabetes Mother   . Hypertension Sister   . Diabetes Sister        Home Medications:   Prior to Admission medications   Medication Sig Start Date End Date Taking? Authorizing Provider  amLODipine (NORVASC) 10 MG tablet Take 1 tablet (10 mg total) by mouth daily. 07/01/17  Yes Kriste BasqueKim, Hannah R, DO  carvedilol (COREG) 12.5 MG tablet Take 1 tablet (12.5 mg total) by mouth 2 (two) times daily with a meal. 07/01/17  Yes Kriste BasqueKim, Hannah R, DO  glipiZIDE (GLUCOTROL) 5 MG tablet Take 1 tablet (5 mg total) by mouth 2 (two) times daily before a meal. 07/01/17  Yes Kriste BasqueKim, Hannah R, DO  lisinopril (PRINIVIL,ZESTRIL) 30 MG tablet Take 1 tablet (30 mg total) by mouth daily. 03/31/17  Yes Kriste BasqueKim, Hannah R, DO  potassium chloride SA  (K-DUR,KLOR-CON) 20 MEQ tablet Take 1 tablet (20 mEq total) by mouth daily. 07/01/17  Yes Terressa KoyanagiKim, Hannah R, DO  chlorthalidone (HYGROTON) 25 MG tablet Take 1 tablet (25 mg total) by mouth daily. Patient not taking: Reported on 01/08/2018 07/01/17   Terressa KoyanagiKim, Hannah R, DO  fluticasone [facility](FLONASE) 50 MCG/ACT nasal spray Place 2 sprays into both nostrils daily. Patient not taking: Reported on 01/08/2018 07/01/17   Terressa KoyanagiKim, Hannah R, DO  hydrALAZINE (APRESOLINE) 25 MG tablet Take 1 tablet (25 mg total) by mouth 2 (two) times daily. Patient not taking: Reported on 01/08/2018 07/11/16   Terressa KoyanagiKim, Hannah R, DO  metFORMIN (GLUCOPHAGE-XR) 500 MG 24 hr tablet Take 1 tablet (500 mg total) by mouth daily with breakfast. Patient not taking: Reported on 01/08/2018 07/01/17   Terressa KoyanagiKim, Hannah R, DO  rosuvastatin (CRESTOR) 20 MG tablet Take 1.5 tablets (30 mg total) by mouth daily. Patient not taking: Reported on 01/08/2018 02/21/17 05/22/17  Terressa KoyanagiKim, Hannah R, DO     Allergies:     Allergies  Allergen Reactions  . Penicillins Other (See Comments)    Unknown childhood reaction Has patient had a PCN reaction causing immediate rash, facial/tongue/throat swelling, SOB or lightheadedness with hypotension: Unknown Has patient had a PCN reaction causing severe rash involving mucus membranes or skin necrosis: Unknown Has patient had a PCN reaction that required hospitalization: Unknown Has patient had a PCN reaction occurring within the last 10 years: No If all of the above answers are "NO", then may proceed with Cephalosporin use.      Physical Exam:   Vitals  Blood pressure (!) 213/85, pulse 68, temperature 98.4 F (36.9 C), temperature source Oral, resp. rate 18, height 5\' 10"  (1.778 m), weight (!) 166 kg, last menstrual period 08/10/2006, SpO2 98 %.   1. General  lying in bed in NAD,    2. Normal affect and insight, Not Suicidal or Homicidal, Awake Alert, Oriented X 3.  3. No F.N deficits, ALL C.Nerves Intact, Strength 5/5 all 4  extremities, Sensation intact all 4 extremities, Plantars down going.  4. Ears and Eyes appear Normal, Conjunctivae clear, PERRLA. Moist Oral Mucosa.  5. Supple Neck, No JVD, No cervical lymphadenopathy appriciated, No Carotid Bruits.  6. Symmetrical Chest wall movement, Good air movement bilaterally, slight  crackles bil base, no wheezing.   7. RRR, s1, s2,no m/g/r  8. Positive Bowel Sounds, Abdomen Soft, No tenderness, No organomegaly appriciated,No rebound -guarding or rigidity.  9.  No Cyanosis, Normal Skin Turgor, No Skin Rash or Bruise.  10. Good muscle tone,  joints appear normal , no effusions, Normal ROM.  11. No Palpable Lymph Nodes in Neck or Axillae      Data Review:    CBC Recent Labs  Lab 01/08/18 1606  WBC 11.6*  HGB 12.7  HCT 39.8  PLT 216  MCV 91.7  MCH 29.3  MCHC 31.9  RDW 12.7  LYMPHSABS 2.1  MONOABS 0.7  EOSABS 0.4  BASOSABS 0.1   ------------------------------------------------------------------------------------------------------------------  Chemistries  Recent Labs  Lab 01/08/18 1606  NA 140  K 3.0*  CL 100  CO2 30  GLUCOSE 118*  BUN 12  CREATININE 0.91  CALCIUM 9.6   ------------------------------------------------------------------------------------------------------------------ estimated creatinine clearance is 124.1 mL/min (by C-G formula based on SCr of 0.91 mg/dL). ------------------------------------------------------------------------------------------------------------------ No results for input(s): TSH, T4TOTAL, T3FREE, THYROIDAB in the last 72 hours.  Invalid input(s): FREET3  Coagulation profile No results for input(s): INR, PROTIME in the last 168 hours. ------------------------------------------------------------------------------------------------------------------- Recent Labs    01/08/18 1813  DDIMER 0.99*    -------------------------------------------------------------------------------------------------------------------  Cardiac Enzymes No results for input(s): CKMB, TROPONINI, MYOGLOBIN in the last 168 hours.  Invalid input(s): CK ------------------------------------------------------------------------------------------------------------------    Component Value Date/Time   BNP 137.2 (H) 01/08/2018 1606     ---------------------------------------------------------------------------------------------------------------  Urinalysis No results found for: COLORURINE, APPEARANCEUR, LABSPEC, PHURINE, GLUCOSEU, HGBUR, BILIRUBINUR, KETONESUR, PROTEINUR, UROBILINOGEN, NITRITE, LEUKOCYTESUR  ----------------------------------------------------------------------------------------------------------------   Imaging Results:    Dg Chest 2 View  Result Date: 01/08/2018 CLINICAL DATA:  CP, SOB since Monday. Hx of hypertension, diabetes. Nonsmoker. EXAM: CHEST - 2 VIEW COMPARISON:  Chest x-ray dated 07/11/2016. FINDINGS: Stable cardiomegaly. Lungs are clear. No pleural effusion or pneumothorax seen. Osseous structures are unremarkable. IMPRESSION: Stable cardiomegaly. No active cardiopulmonary disease. No evidence of pneumonia or pulmonary edema. Electronically Signed   By: Bary Richard M.D.   On: 01/08/2018 16:54   Ct Angio Chest Pe W/cm &/or Wo Cm  Result Date: 01/08/2018 CLINICAL DATA:  Hypertension and dyspnea since Monday. EXAM: CT ANGIOGRAPHY CHEST WITH CONTRAST TECHNIQUE: Multidetector CT imaging of the chest was performed using the standard protocol during bolus administration of intravenous contrast. Multiplanar CT image reconstructions and MIPs were obtained to evaluate the vascular anatomy. CONTRAST:  80 cc ISOVUE-370 IOPAMIDOL (ISOVUE-370) INJECTION 76% COMPARISON:  CXR 01/08/2018 FINDINGS: Cardiovascular: The study is of quality for the evaluation of pulmonary embolism. There are no filling  defects in the central, lobar, segmental or subsegmental pulmonary artery branches to suggest acute pulmonary embolism. Great vessels are normal in course and caliber. Mild cardiomegaly without pericardial effusion. Slightly ectatic thoracic aorta measuring up to 3.8 cm along the ascending portion. No dissection though study is tailored for the pulmonary arterial system. Coronary arteriosclerosis along the LAD. Mediastinum/Nodes: No discrete thyroid nodules. Unremarkable esophagus. No pathologically enlarged axillary, mediastinal or hilar lymph nodes. Lungs/Pleura: No pneumothorax. Trace bilateral pleural fluid with ground-glass opacities within the lungs possibly representing hypoventilatory change, alveolitis or pneumonitis or possibly stigmata of mild pulmonary edema. Nodular opacity in the lateral segment of right middle lobe measuring 5.1 mm in average. Upper abdomen: Unremarkable. Musculoskeletal: No aggressive appearing focal osseous lesions. Degenerative change along the dorsal spine. Review of the MIP images confirms the above findings. IMPRESSION: 1. Coronary arteriosclerosis. 2. Ectatic ascending thoracic aorta  to 3.8 cm.  No dissection. 3. No acute pulmonary embolus. 4. 5.1 mm right middle lobe nodule. Non-contrast chest CT at 6-12 months is recommended. If the nodule is stable at time of repeat CT, then future CT at 18-24 months (from today's scan) is considered optional for low-risk patients, but is recommended for high-risk patients. This recommendation follows the consensus statement: Guidelines for Management of Incidental Pulmonary Nodules Detected on CT Images: From the Fleischner Society 2017; Radiology 2017; 284:228-243. Aortic Atherosclerosis (ICD10-I70.0). Electronically Signed   By: Tollie Eth M.D.   On: 01/08/2018 20:18       Assessment & Plan:    Principal Problem:   Chest pain Active Problems:   Type 2 diabetes mellitus without complication, without long-term current use of  insulin (HCC)   Hypertensive emergency    Chest pain, Dyspnea Tele Trop I q6h x3 Check Lipid Check cardiac echo NPO after [facility] consult by email regarding chest pain due to CT chest shows CAD Start Aspirin 325mg  po qday Cont Crestor 30mg  po qhs Morphine 0.5mg  iv q4h prn chest pain if nitro not working  Hypertensive Emergency due to noncompliance Cont Norvasc 10mg  po qday Cont Carvedilol 12.5mg  po bid Cont Chlorthalidone 25mg  po qday Cont Lisinopril 30mg  po qday Cont Hydralazine 25mg  po bid Start nitro GTT  Dm2 HOLD Metformin  fsbs q4h , ISS  Lung nodule Pt will need outpatient f/u w CT chest   DVT Prophylaxis Lovenox - SCDs  AM Labs Ordered, also please review Full Orders  Family Communication: Admission, patients condition and plan of care including tests being ordered have been discussed with the patient  who indicate understanding and agree with the plan and Code Status.  Code Status  FULL CODE  Likely DC to  home  Condition GUARDED   Consults called:  Cardiology   Admission status: inpatient  Time spent in minutes : 60   Pearson Grippe M.D on 01/08/2018 at 8:50 PM  Between 7am to 7pm - Pager - [phone]  After 7pm go to [email or web address] - password [facility]  [facility] - Office  [phone]

## 2018-01-08 NOTE — ED Triage Notes (Signed)
Pt arrives via EMS from PCP office with reports of HTN. Pt was at PCP with reports of SOB since Monday. EKG sinus rhythm with frequent PACs. Pt reports not taking BP meds as prescribed.

## 2018-01-08 NOTE — Progress Notes (Signed)
HPI:  Using dictation device. Unfortunately this device frequently misinterprets words/phrases.  Jenna Stokes is a pleasant and hard-working 52 year old female with a past medical history significant for morbid obesity, diabetes mellitus, hypertension and hyperlipidemia.  She has a history of poor compliance with medications and follow-up.  She presents today with a one-week history of worsening shortness of breath, orthopnea, presyncope, DOE and "thumping" in the chest.  She reports that today she gets short of breath and feels like she is going to pass out even after short distances of a few steps.  Denies any chest pain or pressure currently, but admits to some chest discomfort on and off.  She admits that she often does not take any of her medications sometimes for several weeks.  Reports she has been taking most of them except for the chlorthalidone, metformin, and Crestor recently.  ROS: See pertinent positives and negatives per HPI.  Past Medical History:  Diagnosis Date  . 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 Hyperlipidemia   . Hypertension   . Morbid obesity (HCC)     Past Surgical History:  Procedure Laterality Date  . ABDOMINAL HYSTERECTOMY    . CESAREAN SECTION    . TONSILLECTOMY      Family History  Problem Relation Age of Onset  . Heart disease Mother   . Diabetes Mother   . Hypertension Sister   . Diabetes Sister     SOCIAL HX: See HPI   Current Outpatient Medications:  .  amLODipine (NORVASC) 10 MG tablet, Take 1 tablet (10 mg total) by mouth daily., Disp: 90 tablet, Rfl: 3 .  carvedilol (COREG) 12.5 MG tablet, Take 1 tablet (12.5 mg total) by mouth 2 (two) times daily with a meal., Disp: 180 tablet, Rfl: 3 .  chlorthalidone (HYGROTON) 25 MG tablet, Take 1 tablet (25 mg total) by mouth daily., Disp: 90 tablet, Rfl: 3 .  fluticasone (FLONASE) 50 MCG/ACT nasal spray, Place 2 sprays into both nostrils daily., Disp: 16 g, Rfl:  0 .  glipiZIDE (GLUCOTROL) 5 MG tablet, Take 1 tablet (5 mg total) by mouth 2 (two) times daily before a meal., Disp: 120 tablet, Rfl: 3 .  hydrALAZINE (APRESOLINE) 25 MG tablet, Take 1 tablet (25 mg total) by mouth 2 (two) times daily., Disp: 180 tablet, Rfl: 1 .  lisinopril (PRINIVIL,ZESTRIL) 30 MG tablet, Take 1 tablet (30 mg total) by mouth daily., Disp: 90 tablet, Rfl: 1 .  metFORMIN (GLUCOPHAGE-XR) 500 MG 24 hr tablet, Take 1 tablet (500 mg total) by mouth daily with breakfast., Disp: 60 tablet, Rfl: 1 .  potassium chloride SA (K-DUR,KLOR-CON) 20 MEQ tablet, Take 1 tablet (20 mEq total) by mouth daily., Disp: 30 tablet, Rfl: 3 .  rosuvastatin (CRESTOR) 20 MG tablet, Take 1.5 tablets (30 mg total) by mouth daily., Disp: 45 tablet, Rfl: 2  EXAM:  Vitals:   01/08/18 1409 01/08/18 1424  BP: (!) 172/110 (!) 190/125  Pulse: 72   Temp: 98.9 F (37.2 C)   SpO2: 98%     Body mass index is 54.05 kg/m.  GENERAL: vitals reviewed and listed above, alert, oriented, out of breath with sentences and with walking.  The obese.  HEENT: atraumatic, conjunttiva clear, no obvious abnormalities on inspection of external nose and ears  NECK: no obvious masses on inspection  LUNGS: clear to auscultation bilaterally, no wheezes, rales or rhonchi, poor air movement to the bases, tachypnea  CV: HRRR, no peripheral edema  MS: moves  all extremities without noticeable abnormality  PSYCH: pleasant and cooperative, no obvious depression or anxiety  ASSESSMENT AND PLAN:  Discussed the following assessment and plan: More than 50% of over 40 minutes spent in total in caring for this patient was spent face-to-face with the patient, counseling and/or coordinating care.   Chest discomfort - Plan: EKG 12-Lead  SOB (shortness of breath) - Plan: EKG 12-Lead  -I recommended EMS transport to the emergency department for further evaluation -Concerning symptoms worsening over the last week -She refused  initially.  Frequent PACs on EKG along with other findings seen on prior and some T wave abnormalities. With degree of dyspnea, severely elevated BP, EKG abnormal and that she reports "I feel like I will pass out after taking 2-3 steps" I strongly advised EMS transport for eval at the hospital. She agreed.   There are no Patient Instructions on file for this visit.  Terressa KoyanagiHannah R Kim, DO

## 2018-01-09 ENCOUNTER — Inpatient Hospital Stay (HOSPITAL_COMMUNITY): Payer: BC Managed Care – PPO

## 2018-01-09 ENCOUNTER — Encounter (HOSPITAL_COMMUNITY): Payer: Self-pay | Admitting: Cardiology

## 2018-01-09 ENCOUNTER — Other Ambulatory Visit: Payer: Self-pay

## 2018-01-09 DIAGNOSIS — I340 Nonrheumatic mitral (valve) insufficiency: Secondary | ICD-10-CM

## 2018-01-09 DIAGNOSIS — R0600 Dyspnea, unspecified: Secondary | ICD-10-CM

## 2018-01-09 DIAGNOSIS — R079 Chest pain, unspecified: Secondary | ICD-10-CM

## 2018-01-09 DIAGNOSIS — I161 Hypertensive emergency: Principal | ICD-10-CM

## 2018-01-09 LAB — CBC
HEMATOCRIT: 34.9 % — AB (ref 36.0–46.0)
HEMOGLOBIN: 11.3 g/dL — AB (ref 12.0–15.0)
MCH: 29.6 pg (ref 26.0–34.0)
MCHC: 32.4 g/dL (ref 30.0–36.0)
MCV: 91.4 fL (ref 78.0–100.0)
Platelets: 172 10*3/uL (ref 150–400)
RBC: 3.82 MIL/uL — ABNORMAL LOW (ref 3.87–5.11)
RDW: 12.9 % (ref 11.5–15.5)
WBC: 9.2 10*3/uL (ref 4.0–10.5)

## 2018-01-09 LAB — COMPREHENSIVE METABOLIC PANEL
ALBUMIN: 3.3 g/dL — AB (ref 3.5–5.0)
ALT: 20 U/L (ref 0–44)
ANION GAP: 8 (ref 5–15)
AST: 21 U/L (ref 15–41)
Alkaline Phosphatase: 47 U/L (ref 38–126)
BUN: 12 mg/dL (ref 6–20)
CO2: 28 mmol/L (ref 22–32)
Calcium: 9.4 mg/dL (ref 8.9–10.3)
Chloride: 102 mmol/L (ref 98–111)
Creatinine, Ser: 0.91 mg/dL (ref 0.44–1.00)
GFR calc Af Amer: >60 mL/min (ref >60)
GFR calc non Af Amer: >60 mL/min (ref >60)
GLUCOSE: 170 mg/dL — AB (ref 70–99)
POTASSIUM: 3 mmol/L — AB (ref 3.5–5.1)
Sodium: 138 mmol/L (ref 135–145)
Total Bilirubin: 0.5 mg/dL (ref 0.3–1.2)
Total Protein: 5.9 g/dL — ABNORMAL LOW (ref 6.5–8.1)

## 2018-01-09 LAB — LIPID PANEL
Cholesterol: 163 mg/dL (ref 0–200)
HDL: 40 mg/dL — AB (ref >40)
LDL CALC: 88 mg/dL (ref 0–99)
Total CHOL/HDL Ratio: 4.1 RATIO
Triglycerides: 176 mg/dL — ABNORMAL HIGH (ref <150)
VLDL: 35 mg/dL (ref 0–40)

## 2018-01-09 LAB — TROPONIN I
Troponin I: 0.04 ng/mL (ref <0.03)
Troponin I: 0.04 ng/mL (ref <0.03)

## 2018-01-09 LAB — MRSA PCR SCREENING: MRSA by PCR: NEGATIVE

## 2018-01-09 LAB — ECHOCARDIOGRAM COMPLETE
Height: 69 in
WEIGHTICAEL: 5811.2 [oz_av]

## 2018-01-09 LAB — GLUCOSE, CAPILLARY
GLUCOSE-CAPILLARY: 128 mg/dL — AB (ref 70–99)
GLUCOSE-CAPILLARY: 146 mg/dL — AB (ref 70–99)
GLUCOSE-CAPILLARY: 146 mg/dL — AB (ref 70–99)
GLUCOSE-CAPILLARY: 153 mg/dL — AB (ref 70–99)
GLUCOSE-CAPILLARY: 207 mg/dL — AB (ref 70–99)
Glucose-Capillary: 220 mg/dL — ABNORMAL HIGH (ref 70–99)

## 2018-01-09 LAB — HEMOGLOBIN A1C
Hgb A1c MFr Bld: 7.3 % — ABNORMAL HIGH (ref 4.8–5.6)
Mean Plasma Glucose: 162.81 mg/dL

## 2018-01-09 LAB — MAGNESIUM: MAGNESIUM: 2.1 mg/dL (ref 1.7–2.4)

## 2018-01-09 LAB — HIV ANTIBODY (ROUTINE TESTING W REFLEX): HIV Screen 4th Generation wRfx: NONREACTIVE

## 2018-01-09 MED ORDER — PNEUMOCOCCAL VAC POLYVALENT 25 MCG/0.5ML IJ INJ
0.5000 mL | INJECTION | INTRAMUSCULAR | Status: DC
  Filled 2018-01-09: qty 0.5

## 2018-01-09 MED ORDER — POTASSIUM CHLORIDE 20 MEQ/15ML (10%) PO SOLN
40.0000 meq | ORAL | Status: DC
  Administered 2018-01-09: 40 meq via ORAL
  Filled 2018-01-09 (×2): qty 30

## 2018-01-09 MED ORDER — HYDRALAZINE HCL 20 MG/ML IJ SOLN
10.0000 mg | INTRAMUSCULAR | Status: DC | PRN
  Administered 2018-01-09: 20 mg via INTRAVENOUS
  Administered 2018-01-10: 10 mg via INTRAVENOUS
  Administered 2018-01-10: 20 mg via INTRAVENOUS
  Administered 2018-01-11: 10 mg via INTRAVENOUS
  Administered 2018-01-11 (×2): 20 mg via INTRAVENOUS
  Filled 2018-01-09 (×5): qty 1

## 2018-01-09 MED ORDER — ROSUVASTATIN CALCIUM 20 MG PO TABS
30.0000 mg | ORAL_TABLET | Freq: Every day | ORAL | Status: DC
  Administered 2018-01-09 – 2018-01-12 (×4): 30 mg via ORAL
  Filled 2018-01-09: qty 3
  Filled 2018-01-09 (×4): qty 1

## 2018-01-09 MED ORDER — METFORMIN HCL ER 500 MG PO TB24
500.0000 mg | ORAL_TABLET | Freq: Every day | ORAL | Status: DC
  Administered 2018-01-10 – 2018-01-12 (×3): 500 mg via ORAL
  Filled 2018-01-09 (×3): qty 1

## 2018-01-09 MED ORDER — POTASSIUM CHLORIDE 10 MEQ/100ML IV SOLN
10.0000 meq | INTRAVENOUS | Status: DC
  Administered 2018-01-09: 10 meq via INTRAVENOUS
  Filled 2018-01-09: qty 100

## 2018-01-09 MED ORDER — NICARDIPINE HCL IN NACL 20-0.86 MG/200ML-% IV SOLN
3.0000 mg/h | INTRAVENOUS | Status: DC
  Administered 2018-01-09: 10 mg/h via INTRAVENOUS
  Administered 2018-01-09: 5 mg/h via INTRAVENOUS
  Administered 2018-01-09: 12.5 mg/h via INTRAVENOUS
  Administered 2018-01-10: 7.5 mg/h via INTRAVENOUS
  Administered 2018-01-10: 10 mg/h via INTRAVENOUS
  Administered 2018-01-10: 7.5 mg/h via INTRAVENOUS
  Administered 2018-01-10: 2.5 mg/h via INTRAVENOUS
  Administered 2018-01-10: 5 mg/h via INTRAVENOUS
  Filled 2018-01-09 (×8): qty 200

## 2018-01-09 MED ORDER — CARVEDILOL 12.5 MG PO TABS
12.5000 mg | ORAL_TABLET | Freq: Two times a day (BID) | ORAL | Status: DC
  Administered 2018-01-09: 12.5 mg via ORAL
  Filled 2018-01-09: qty 1

## 2018-01-09 MED ORDER — AMLODIPINE BESYLATE 10 MG PO TABS
10.0000 mg | ORAL_TABLET | Freq: Every day | ORAL | Status: DC
  Administered 2018-01-09 – 2018-01-12 (×4): 10 mg via ORAL
  Filled 2018-01-09 (×4): qty 1

## 2018-01-09 MED ORDER — CHLORTHALIDONE 25 MG PO TABS
25.0000 mg | ORAL_TABLET | Freq: Every day | ORAL | Status: DC
  Administered 2018-01-09 – 2018-01-12 (×4): 25 mg via ORAL
  Filled 2018-01-09 (×5): qty 1

## 2018-01-09 MED ORDER — POTASSIUM CHLORIDE CRYS ER 20 MEQ PO TBCR
40.0000 meq | EXTENDED_RELEASE_TABLET | ORAL | Status: AC
  Administered 2018-01-09 (×2): 40 meq via ORAL
  Filled 2018-01-09 (×2): qty 2

## 2018-01-09 MED ORDER — CARVEDILOL 12.5 MG PO TABS: 12.5000 mg | ORAL_TABLET | Freq: Two times a day (BID) | ORAL | Status: DC

## 2018-01-09 MED ORDER — POTASSIUM CHLORIDE 20 MEQ/15ML (10%) PO SOLN: 40.0000 meq | ORAL | Status: DC

## 2018-01-09 MED ORDER — ONDANSETRON HCL 4 MG/2ML IJ SOLN
4.0000 mg | Freq: Four times a day (QID) | INTRAMUSCULAR | Status: DC | PRN
  Administered 2018-01-09 – 2018-01-12 (×3): 4 mg via INTRAVENOUS
  Filled 2018-01-09 (×2): qty 2

## 2018-01-09 MED ORDER — FAMOTIDINE 20 MG PO TABS
20.0000 mg | ORAL_TABLET | Freq: Two times a day (BID) | ORAL | Status: DC
  Administered 2018-01-09 – 2018-01-12 (×7): 20 mg via ORAL
  Filled 2018-01-09 (×7): qty 1

## 2018-01-09 MED ORDER — ONDANSETRON HCL 4 MG/2ML IJ SOLN
INTRAMUSCULAR | Status: AC
Start: 2018-01-09 — End: 2018-01-10
  Filled 2018-01-09: qty 2

## 2018-01-09 MED ORDER — LISINOPRIL 20 MG PO TABS
30.0000 mg | ORAL_TABLET | Freq: Every day | ORAL | Status: DC
  Administered 2018-01-09 – 2018-01-10 (×2): 30 mg via ORAL
  Filled 2018-01-09: qty 3
  Filled 2018-01-09: qty 1

## 2018-01-09 MED ORDER — SPIRONOLACTONE 25 MG PO TABS
25.0000 mg | ORAL_TABLET | Freq: Every day | ORAL | Status: DC
  Administered 2018-01-09 – 2018-01-12 (×4): 25 mg via ORAL
  Filled 2018-01-09 (×4): qty 1

## 2018-01-09 MED ORDER — HYDRALAZINE HCL 25 MG PO TABS
25.0000 mg | ORAL_TABLET | Freq: Four times a day (QID) | ORAL | Status: DC | PRN
  Administered 2018-01-09: 25 mg via ORAL
  Filled 2018-01-09: qty 1

## 2018-01-09 MED ORDER — GLIPIZIDE 5 MG PO TABS
5.0000 mg | ORAL_TABLET | Freq: Two times a day (BID) | ORAL | Status: DC
  Administered 2018-01-09 – 2018-01-12 (×6): 5 mg via ORAL
  Filled 2018-01-09 (×7): qty 1

## 2018-01-09 MED ORDER — FLUTICASONE PROPIONATE 50 MCG/ACT NA SUSP
2.0000 | Freq: Every day | NASAL | Status: DC
  Filled 2018-01-09: qty 16

## 2018-01-09 MED ORDER — POTASSIUM CHLORIDE CRYS ER 20 MEQ PO TBCR
40.0000 meq | EXTENDED_RELEASE_TABLET | ORAL | Status: DC
  Administered 2018-01-09: 40 meq via ORAL
  Filled 2018-01-09: qty 2

## 2018-01-09 MED ORDER — HYDRALAZINE HCL 25 MG PO TABS
25.0000 mg | ORAL_TABLET | Freq: Two times a day (BID) | ORAL | Status: DC
  Administered 2018-01-09 – 2018-01-10 (×3): 25 mg via ORAL
  Filled 2018-01-09 (×4): qty 1

## 2018-01-09 NOTE — Consult Note (Addendum)
Cardiology Consultation:   Patient ID: Jenna Stokes; [id number]; 08/28/1965   Admit date: 01/08/2018 Date of Consult: 01/09/2018  Primary Care Provider: Terressa Koyanagi, DO Primary Cardiologist: Dr. Elease Hashimoto   Patient Profile:   Jenna Stokes is a 52 y.o. female with a hx hypertensive heart disease (severe LVH and G1DD by echo), false positive NST in 2012 (inferior defect), but normal coronaries on cardiac cath, T2DM, HTN, HLD, morbid obesity, medication noncompliance, family h/o adrenal adenoma (sister) and family h/o CKD, who is being seen today for the evaluation of chest pain and exertional dyspnea, in the setting of markedly elevated BP, at the request of Dr. Sharl Ma, Internal Medicine.   History of Present Illness:   Ms. Jenna Stokes is a 52 y/o female with hypertensive heart disease and multiple cardiac risk factors, including T2DM, HTN, HLD and morbid obesity. Unfortunately, she also has a long history of poor compliance with medications and follow-up. She has a family h/o HTN, adrenal adenoma (sister) and CKD. Her mother also had hypertensive heart disease/ CHF and was followed by Dr. Excell Seltzer. She is now deceased . Her sister reports that she had a prior history of severely elevated BP 2/2 an adrenal adenoma and underwent surgical resection, which resulted in normalization of BP.   In 2012, the pt had a NST ordered by Dr. Deborah Chalk, which showed an inferior defect. Subsequently, she was set up for cardiac cath 10/02/2010 which showed normal coronaries and normal LVF (angiographic details outlined below). She has most recently been seen by Dr. Elease Hashimoto. Office visit with him 09/19/15 was for evaluation for dyspnea. She denied CP. Echo was ordered 10/2015 and showed EF to be 75% w/ severe concentric LV wall thickening but no outflow obstruction, G1DD noted w/ mild MR, normal LA size and normal IVC. Her dyspnea was felt mostly related to obesity and weight loss was advised.  She was seen by her PCP yesterday  at [facility] and complained of a 1 week h/o worsening dyspnea, orthopnea, PND, near syncope and chest discomfort. Symptoms in the setting of med noncompliance. Pt admitted to poor compliance with several of her meds, particularly her chlorthalidone, Crestor and metformin.  Her CP was described as a "thumping" sensation in her chest.  Per PCP note, her EKG showed frequent PACs and some Twave abnormalities. BP was markedly elevated at 190/125. Given the nature of her symptoms and severely elevated BP, EMS was called for transport and pt was brought into the [facility] ED.   In the ED, additional w/u revealed negative acute findings on chest CT. No dissection or PE. However coronary arteriosclerosis noted along the LAD as well as an incidental finding of a 5.1 mm RML nodule. CXR showed stable cardiomegaly. No active cardiopulmonary disease. No evidence of pneumonia or pulmonary edema. EKG showed SR with frequent PACs and LVH w/ repol abnormalities. Troponin minimally elevated but with flat trend at 0.04>>0.04. WBC ct was initially abnormal at 11.6 but WNL today at 9.2. H/H with mild anemia at 11.3/34.9. Hypokalemia also noted w/ K at 3.0. SCr WNL at 0.91. Fasting BG this morning elevated at 170 mg/dL.   BP remains markedly elevated at 201/89 this morning. She is currently on IV nitro and ordered to get Coreg 12.5 mg BID.  Primary team has just ordered hydralazine Q6H. She is currently CP free but has a HA (pt states she did not develop a HA until IV nitro was started). No resting dyspnea sitting up but feels "smothered" laying down  and has to sit up. Still notes exertional dyspnea.   Past Medical History:  Diagnosis Date  . 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 Hyperlipidemia   . Hypertension   . Morbid obesity (HCC)     Past Surgical History:  Procedure Laterality Date  . ABDOMINAL HYSTERECTOMY    . CESAREAN SECTION    . TONSILLECTOMY       Home Medications:    Prior to Admission medications   Medication Sig Start Date End Date Taking? Authorizing Provider  amLODipine (NORVASC) 10 MG tablet Take 1 tablet (10 mg total) by mouth daily. 07/01/17  Yes [name], DO  carvedilol (COREG) 12.5 MG tablet Take 1 tablet (12.5 mg total) by mouth 2 (two) times daily with a meal. 07/01/17  Yes [name], DO  glipiZIDE (GLUCOTROL) 5 MG tablet Take 1 tablet (5 mg total) by mouth 2 (two) times daily before a meal. 07/01/17  Yes [name], DO  lisinopril (PRINIVIL,ZESTRIL) 30 MG tablet Take 1 tablet (30 mg total) by mouth daily. 03/31/17  Yes [name], DO  potassium chloride SA (K-DUR,KLOR-CON) 20 MEQ tablet Take 1 tablet (20 mEq total) by mouth daily. 07/01/17  Yes Terressa Koyanagi, DO  chlorthalidone (HYGROTON) 25 MG tablet Take 1 tablet (25 mg total) by mouth daily. Patient not taking: Reported on 01/08/2018 07/01/17   Terressa Koyanagi, DO  fluticasone [facility]) 50 MCG/ACT nasal spray Place 2 sprays into both nostrils daily. Patient not taking: Reported on 01/08/2018 07/01/17   Terressa Koyanagi, DO  hydrALAZINE (APRESOLINE) 25 MG tablet Take 1 tablet (25 mg total) by mouth 2 (two) times daily. Patient not taking: Reported on 01/08/2018 07/11/16   Terressa Koyanagi, DO  metFORMIN (GLUCOPHAGE-XR) 500 MG 24 hr tablet Take 1 tablet (500 mg total) by mouth daily with breakfast. Patient not taking: Reported on 01/08/2018 07/01/17   Terressa Koyanagi, DO  rosuvastatin (CRESTOR) 20 MG tablet Take 1.5 tablets (30 mg total) by mouth daily. Patient not taking: Reported on 01/08/2018 02/21/17 05/22/17  Terressa Koyanagi, DO    Inpatient Medications: Scheduled Meds: . carvedilol  12.5 mg Oral BID WC  . enoxaparin (LOVENOX) injection  80 mg Subcutaneous Q24H  . famotidine  20 mg Oral BID  . insulin aspart  0-9 Units Subcutaneous Q4H  . [START ON 01/10/2018] pneumococcal 23 valent vaccine  0.5 mL Intramuscular Tomorrow-1000  . potassium chloride  40 mEq Oral Q4H  . sodium chloride flush  3 mL  Intravenous Q12H   Continuous Infusions: . sodium chloride    . niCARDipine    . nitroGLYCERIN 30 mcg/min (01/09/18 0819)   PRN Meds: sodium chloride, acetaminophen **OR** acetaminophen, hydrALAZINE, morphine injection, sodium chloride flush  Allergies:    Allergies  Allergen Reactions  . Penicillins Other (See Comments)    Unknown childhood reaction Has patient had a PCN reaction causing immediate rash, facial/tongue/throat swelling, SOB or lightheadedness with hypotension: Unknown Has patient had a PCN reaction causing severe rash involving mucus membranes or skin necrosis: Unknown Has patient had a PCN reaction that required hospitalization: Unknown Has patient had a PCN reaction occurring within the last 10 years: No If all of the above answers are "NO", then may proceed with Cephalosporin use.     Social History:   Social History   Socioeconomic History  . Marital status: Single    Spouse name: Not on file  . Number of children: Not on  file  . Years of education: Not on file  . Highest education level: Not on file  Occupational History  . Not on file  Social Needs  . Financial resource strain: Not on file  . Food insecurity:    Worry: Not on file    Inability: Not on file  . Transportation needs:    Medical: Not on file    Non-medical: Not on file  Tobacco Use  . Smoking status: Never Smoker  . Smokeless tobacco: Never Used  Substance and Sexual Activity  . Alcohol use: No  . Drug use: No  . Sexual activity: Not on file  Lifestyle  . Physical activity:    Days per week: Not on file    Minutes per session: Not on file  . Stress: Not on file  Relationships  . Social connections:    Talks on phone: Not on file    Gets together: Not on file    Attends religious service: Not on file    Active member of club or organization: Not on file    Attends meetings of clubs or organizations: Not on file    Relationship status: Not on file  . Intimate partner  violence:    Fear of current or ex partner: Not on file    Emotionally abused: Not on file    Physically abused: Not on file    Forced sexual activity: Not on file  Other Topics Concern  . 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 Heart disease Mother   . Diabetes Mother   . Hypertension Sister   . Diabetes Sister   . Adrenal disorder Sister      ROS:  Please see the history of present illness.   All other ROS reviewed and negative.     Physical Exam/Data:   Vitals:   01/09/18 0955 01/09/18 1000 01/09/18 1100 01/09/18 1123  BP: (!) 201/89 (!) 194/84 (!) 221/102 (!) 239/103  Pulse: 60     Resp:  16 15   Temp:      TempSrc:      SpO2:      Weight:      Height:        Intake/Output Summary (Last 24 hours) at 01/09/2018 1135 Last data filed at 01/09/2018 0639 Gross per 24 hour  Intake 32.37 ml  Output 400 ml  Net -367.63 ml   Filed Weights   01/08/18 1558 01/08/18 2300 01/09/18 0415  Weight: (!) 166 kg (!) 164.7 kg (!) 164.7 kg   Body mass index is 53.64 kg/m.  General:  Morbidly obese BF, no acute distress HEENT: normal Lymph: no adenopathy Neck: no JVD Endocrine:  No thryomegaly Vascular: No carotid bruits; FA pulses 2+ bilaterally without bruits  Cardiac:  normal S1, S2; RRR; no murmur  Lungs:  clear to auscultation bilaterally, no wheezing, rhonchi or rales  Abd: obese, soft, nontender, no hepatomegaly  Ext: obese no edema Musculoskeletal:  No deformities, BUE and BLE strength normal and equal Skin: warm and dry  Neuro:  CNs 2-12 intact, no focal abnormalities noted Psych:  Normal affect  EKG:  The EKG was personally reviewed and demonstrates:  SR with PACs and LVH w/ repol abnormalities  Telemetry:  Telemetry was personally  reviewed and demonstrates:  NSR  Relevant CV Studies: [facility] 10/02/2010 ANGIOGRAPHIC DATA:  The left ventricular angiogram was performed in the  RAO position.  Overall cardiac size and silhouette were normal.  The  global ejection fraction was 60%.  Regional wall motion was normal.   Coronary arteries:  The coronary arteries arise and distribute normally.  1. Left main coronary artery is normal.  2. Left anterior descending has a diagonal vessel and then the second      diagonal vessel.  It is the largest branch.  The left anterior      descending is normal.  3. Left circumflex has a more proximal obtuse marginal and then a      bifurcating distal obtuse marginal.  It is normal.  4. Right coronary artery is a small, but dominant vessel.  It is      normal.  OVERALL IMPRESSION:  1. Normal left ventricular function.  2. Normal coronary arteries.  3. Hypertension with hypertensive heart disease.   2D Echo 10/03/15 Study Conclusions  - Left ventricle: The cavity size was normal. Wall thickness was   increased in a pattern of severe LVH. Systolic function was   normal. The estimated ejection fraction was 75%. Wall motion was   normal; there were no regional wall motion abnormalities. Doppler   parameters are consistent with abnormal left ventricular   relaxation (grade 1 diastolic dysfunction). The E/e&' ratio is   between 8-15, suggesting indeterminate LV filling pressure. - Mitral valve: Mildly thickened leaflets . There was mild   regurgitation. - Left atrium: The atrium was normal in size. - Inferior vena cava: The vessel was normal in size. The   respirophasic diameter changes were in the normal range (>= 50%),   consistent with normal central venous pressure.  Impressions:  - LVEF 75%, severe concentric LV wall thickening (consider HCM), no   outflow obstruction, diastolic dysfunction, indeterminate LV   filling pressure, mild MR, normal LA size, normal IVC.  Laboratory  Data:  Chemistry Recent Labs  Lab 01/08/18 1606 01/09/18 0239  NA 140 138  K 3.0* 3.0*  CL 100 102  CO2 30 28  GLUCOSE 118* 170*  BUN 12 12  CREATININE 0.91 0.91  CALCIUM 9.6 9.4  GFRNONAA >60 >60  GFRAA >60 >60  ANIONGAP 10 8    Recent Labs  Lab 01/09/18 0239  PROT 5.9*  ALBUMIN 3.3*  AST 21  ALT 20  ALKPHOS 47  BILITOT 0.5   Hematology Recent Labs  Lab 01/08/18 1606 01/09/18 0239  WBC 11.6* 9.2  RBC 4.34 3.82*  HGB 12.7 11.3*  HCT 39.8 34.9*  MCV 91.7 91.4  MCH 29.3 29.6  MCHC 31.9 32.4  RDW 12.7 12.9  PLT 216 172   Cardiac Enzymes Recent Labs  Lab 01/08/18 2128 01/09/18 0239  TROPONINI 0.04* 0.04*    Recent Labs  Lab 01/08/18 1616  TROPIPOC 0.04    BNP Recent Labs  Lab 01/08/18 1606  BNP 137.2*    DDimer  Recent Labs  Lab 01/08/18 1813  DDIMER 0.99*    Radiology/Studies:  Dg Chest 2 View  Result Date: 01/08/2018 CLINICAL DATA:  CP, SOB since Monday. Hx of hypertension, diabetes. Nonsmoker. EXAM: CHEST - 2 VIEW COMPARISON:  Chest x-ray dated 07/11/2016. FINDINGS: Stable cardiomegaly. Lungs are clear. No pleural effusion or pneumothorax  seen. Osseous structures are unremarkable. IMPRESSION: Stable cardiomegaly. No active cardiopulmonary disease. No evidence of pneumonia or pulmonary edema. Electronically Signed   By: Bary Richard M.D.   On: 01/08/2018 16:54   Ct Angio Chest Pe W/cm &/or Wo Cm  Result Date: 01/08/2018 CLINICAL DATA:  Hypertension and dyspnea since Monday. EXAM: CT ANGIOGRAPHY CHEST WITH CONTRAST TECHNIQUE: Multidetector CT imaging of the chest was performed using the standard protocol during bolus administration of intravenous contrast. Multiplanar CT image reconstructions and MIPs were obtained to evaluate the vascular anatomy. CONTRAST:  80 cc ISOVUE-370 IOPAMIDOL (ISOVUE-370) INJECTION 76% COMPARISON:  CXR 01/08/2018 FINDINGS: Cardiovascular: The study is of quality for the evaluation of pulmonary embolism. There are no  filling defects in the central, lobar, segmental or subsegmental pulmonary artery branches to suggest acute pulmonary embolism. Great vessels are normal in course and caliber. Mild cardiomegaly without pericardial effusion. Slightly ectatic thoracic aorta measuring up to 3.8 cm along the ascending portion. No dissection though study is tailored for the pulmonary arterial system. Coronary arteriosclerosis along the LAD. Mediastinum/Nodes: No discrete thyroid nodules. Unremarkable esophagus. No pathologically enlarged axillary, mediastinal or hilar lymph nodes. Lungs/Pleura: No pneumothorax. Trace bilateral pleural fluid with ground-glass opacities within the lungs possibly representing hypoventilatory change, alveolitis or pneumonitis or possibly stigmata of mild pulmonary edema. Nodular opacity in the lateral segment of right middle lobe measuring 5.1 mm in average. Upper abdomen: Unremarkable. Musculoskeletal: No aggressive appearing focal osseous lesions. Degenerative change along the dorsal spine. Review of the MIP images confirms the above findings. IMPRESSION: 1. Coronary arteriosclerosis. 2. 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  No dissection. 3. No acute pulmonary embolus. 4. 5.1 mm right middle lobe nodule. Non-contrast chest CT at 6-12 months is recommended. If the nodule is stable at time of repeat CT, then future CT at 18-24 months (from today's scan) is considered optional for low-risk patients, but is recommended for high-risk patients. This recommendation follows the consensus statement: Guidelines for Management of Incidental Pulmonary Nodules Detected on CT Images: From the Fleischner Society 2017; Radiology 2017; 284:228-243. Aortic Atherosclerosis (ICD10-I70.0). Electronically Signed   By: Tollie Eth M.D.   On: 01/08/2018 20:18    Assessment and Plan:   ANTWAN BRIBIESCA is a 52 y.o. female with a hx hypertensive heart disease (severe LVH and G1DD by echo), false positive NST in 2012  (inferior defect), but normal coronaries on cardiac cath, T2DM, HTN, HLD, morbid obesity, medication noncompliance, family h/o adrenal adenoma (sister) and family h/o CKD, who is being seen today for the evaluation of chest pain and exertional dyspnea, in the setting of markedly elevated BP, at the request of Dr. Sharl Ma, Internal Medicine.   1. Hypertensive Urgency: SBPs remain elevated in the 200s and DBPs in the low 100s. Pt notes slight HA, but she is attributing this to IV nitro. Pt also given coreg 12.5 mg and also 25 mg of hydralazine recently given. RN to contact primary team to see if pt should be transferred to another unit given severity of BP. Will need to monitor respiratory status closely for the development of potential flash pulmonary edema, given her known LVH and diastolic dysfunction. Her CXR on admit showed cardiomegaly but no edema. BNP was mildly elevated at 137.2 however BNPs can be falsely low in the setting of morbid obesity. It is difficult to assess volume status given morbid obesity, but given symptoms or exertional dyspnea and orthopnea, would recommend dose of IV lasix. Renal function is stable but  pt hypokalemic and K will need to be monitored closely.   Additional W/u to consider  -- renin:aldosterone ratio (family h/o adrenal adenoma in sister, requiring resection). Pt also w/ persistent hypokalemia with K at 3.0 (would recommend addition of spironolactone, will start 25 mg once daily) -- bilateral renal artery doppler study to r/o RAS -- UA to check for proteinuria -- Update 2D echo to reassess LVF  I had a long discussion with pt regarding the importance of strict medication adherence for control of BP to reduce risk of worsening heart disease, MI, CVA, renovascular disease etc. Goal BP <130/80.   2. Chest Pain/Coronary Calcifications: in the setting of marked BP elevation. Chest CT on admit negative for dissection however coronary artery calcifications in the LAD noted.  Pt currently CP free. Troponins flat and low level at 0.04>>0.04, likely demand ischemia from elevated BP. EKG shows LVH. Not a candidate for ischemic testing at this time, until BP is better controlled. We will reassess symptoms once BP is under control. Given her poorly controlled cardiac risk factors, she is high risk. She had a normal cardiac cath in the past, but this was 7 years ago in 2012. For now, continue BB. Given her DM would also recommend adding a statin and checking fasting lipid panel to assess baseline LDL. Recommend LDL goal of < 70 mg/dL  3. Morbid Obesity: weight loss and diet modification advised.   4. DM: per IM.  5. Medication Non Compliance: per above. Pt strongly advised to improve compliance to reduce health risk.   6. Pulmonary Nodule: incidental finding on chest CT. A 5.1 mm right middle lobe nodule detected. She denies tobacco history. Pt will need to f/u with PCP for further monitoring/ work-up.    For questions or updates, please contact [facility] Please consult [email or web address] for contact info under Cardiology/STEMI.   Signed, Robbie LisBrittainy Simmons, PA-C  01/09/2018 11:35 AM   Attending Note:   The patient was seen and examined.  Agree with assessment and plan as noted above.  Changes made to the above note as needed.  Patient seen and independently examined with Boyce MediciBrittany Simmons, PA .   We discussed all aspects of the encounter. I agree with the assessment and plan as stated above.  1.  Hypertensive urgency: The patient's blood pressure remains elevated.  She has not taken her medications in quite a while. Suspect that she might have a high renin hypertension.  She has a very low potassium level and has largely been unresponsive to the other medications. We will start spironolactone 25 mg a day. I see that she is scheduled to start a nicardipine drip.  She will be transferred to the intensive care unit.  We could also try clonidine.  2.  Chest pain with  positive troponin levels: Her troponin levels were minimally elevated and I suspect are due to demand ischemia.  She has severe left ventricular hypertrophy and has had marked hypertension for quite a long time.  It is not surprising that her troponin levels are mildly elevated.  I do not think that we should do the stress test soon but rather get her blood pressure under absolute control first.  She had a normal heart catheterization several years ago.  3.  Morbid obesity: Advised her to work on a good diet, exercise, weight loss program.  She will need to work with her general medical doctor about this.  She is been noncompliant with a lot of her medications and is  been difficult for her to stay on track.   I have spent a total of 40 minutes with patient reviewing hospital  notes , telemetry, EKGs, labs and examining patient as well as establishing an assessment and plan that was discussed with the patient. > 50% of time was spent in direct patient care.    Vesta Mixer, Montez Hageman., MD, [facility] 01/09/2018, 12:50 PM [address]  [address] Office [phone] Pager [phone]

## 2018-01-09 NOTE — Progress Notes (Signed)
01/09/2018 1500 Report called to 2H.   Kathryne HitchAllen, Yoshio Seliga C

## 2018-01-09 NOTE — Progress Notes (Signed)
  Echocardiogram 2D Echocardiogram has been performed.  Jenna Stokes, Jenna Stokes 01/09/2018, 2:13 PM

## 2018-01-09 NOTE — Progress Notes (Signed)
[facility]  PROGRESS NOTE  Jenna Stokes:[id number] DOB: May 15, 1966 DOA: 01/08/2018 PCP: Terressa Koyanagi, DO   Brief HPI:   52 year old female with a history of hypertension, hyperlipidemia, diabetes mellitus, type II, morbid obesity, mild mitral regurgitation came to hospital with chest pain, shortness of breath.  CT chest showed coronary calcifications, 5.1 mm right middle lobe nodule.      Subjective   Patient seen and examined, denies chest pain.  Blood pressure is elevated.  Patient tells me that she has been noncompliant with her antihypertensive medications.   Assessment/Plan:     1. Chest pain-resolved at this time.  Cardiology has been consulted.  Troponins times three 0.04.  No further intervention per cardiology at this time. 2. Hypertensive urgency-blood pressure significantly elevated, patient was started on IV nitroglycerin, will discontinue nitroglycerin start Cardene drip.  We will transfer the patient to ICU.  Continue home medications including Coreg, amlodipine, also started hydralazine PRN  3. Hypokalemia-potassium 3.0, patient was started on IV KCl which she could not tolerate.  Change to p.o. KCl but patient started vomiting.  Have changed KCl to liquid formulation for better tolerance. 4. Diabetes mellitus-started on sliding scale insulin with NovoLog.  Blood glucose well controlled. 5. Lung nodule-seen on CT chest, patient will require outpatient noncontrast chest CT at 6 to 12 months    DVT prophylaxis: Lovenox  Code Status: Full code  Family Communication: No family at bedside  Disposition Plan: likely home when medically ready for discharge   Consultants:  None  Procedures:  None    Antibiotics:   Anti-infectives (From admission, onward)   None       Objective   Vitals:   01/09/18 1123 01/09/18 1147 01/09/18 1255 01/09/18 1341  BP: (!) 239/103 (!) 239/103 (!) 221/90 (!) 184/78  Pulse:  61    Resp:      Temp:  98 F (36.7  C)    TempSrc:  Oral    SpO2:      Weight:      Height:        Intake/Output Summary (Last 24 hours) at 01/09/2018 1401 Last data filed at 01/09/2018 4401 Gross per 24 hour  Intake 32.37 ml  Output 400 ml  Net -367.63 ml   Filed Weights   01/08/18 1558 01/08/18 2300 01/09/18 0415  Weight: (!) 166 kg (!) 164.7 kg (!) 164.7 kg     Physical Examination:    General: Appears in no acute distress  Cardiovascular: S1-S2 regular  Respiratory: Clear to auscultation bilaterally  Abdomen: Soft, nontender, no organomegaly  Extremities: No edema of the lower extremities  Neurologic: Alert, oriented x3.  No focal deficit noted     Data Reviewed: I have personally reviewed following labs and imaging studies  CBG: Recent Labs  Lab 01/09/18 0023 01/09/18 0420 01/09/18 0801 01/09/18 1148  GLUCAP 220* 128* 146* 146*    CBC: Recent Labs  Lab 01/08/18 1606 01/09/18 0239  WBC 11.6* 9.2  NEUTROABS 8.3*  --   HGB 12.7 11.3*  HCT 39.8 34.9*  MCV 91.7 91.4  PLT 216 172    Basic Metabolic Panel: Recent Labs  Lab 01/08/18 1606 01/09/18 0239 01/09/18 0857  NA 140 138  --   K 3.0* 3.0*  --   CL 100 102  --   CO2 30 28  --   GLUCOSE 118* 170*  --   BUN 12 12  --   CREATININE 0.91 0.91  --  CALCIUM 9.6 9.4  --   MG  --   --  2.1    Recent Results (from the past 240 hour(s))  MRSA PCR Screening     Status: None   Collection Time: 01/08/18 11:54 PM  Result Value Ref Range Status   MRSA by PCR NEGATIVE NEGATIVE Final    Comment:        The GeneXpert MRSA Assay (FDA approved for NASAL specimens only), is one component of a comprehensive MRSA colonization surveillance program. It is not intended to diagnose MRSA infection nor to guide or monitor treatment for MRSA infections. Performed at [facility], [address] [place], [place] [address]      Liver Function Tests: Recent Labs  Lab 01/09/18 0239  AST 21  ALT 20  ALKPHOS 47  BILITOT 0.5   PROT 5.9*  ALBUMIN 3.3*   No results for input(s): LIPASE, AMYLASE in the last 168 hours. No results for input(s): AMMONIA in the last 168 hours.  Cardiac Enzymes: Recent Labs  Lab 01/08/18 2128 01/09/18 0239 01/09/18 0857  TROPONINI 0.04* 0.04* 0.04*   BNP (last 3 results) Recent Labs    01/08/18 1606  BNP 137.2*    ProBNP (last 3 results) No results for input(s): PROBNP in the last 8760 hours.    Studies: Dg Chest 2 View  Result Date: 01/08/2018 CLINICAL DATA:  CP, SOB since Monday. Hx of hypertension, diabetes. Nonsmoker. EXAM: CHEST - 2 VIEW COMPARISON:  Chest x-ray dated 07/11/2016. FINDINGS: Stable cardiomegaly. Lungs are clear. No pleural effusion or pneumothorax seen. Osseous structures are unremarkable. IMPRESSION: Stable cardiomegaly. No active cardiopulmonary disease. No evidence of pneumonia or pulmonary edema. Electronically Signed   By: Bary Richard M.D.   On: 01/08/2018 16:54   Ct Angio Chest Pe W/cm &/or Wo Cm  Result Date: 01/08/2018 CLINICAL DATA:  Hypertension and dyspnea since Monday. EXAM: CT ANGIOGRAPHY CHEST WITH CONTRAST TECHNIQUE: Multidetector CT imaging of the chest was performed using the standard protocol during bolus administration of intravenous contrast. Multiplanar CT image reconstructions and MIPs were obtained to evaluate the vascular anatomy. CONTRAST:  80 cc ISOVUE-370 IOPAMIDOL (ISOVUE-370) INJECTION 76% COMPARISON:  CXR 01/08/2018 FINDINGS: Cardiovascular: The study is of quality for the evaluation of pulmonary embolism. There are no filling defects in the central, lobar, segmental or subsegmental pulmonary artery branches to suggest acute pulmonary embolism. Great vessels are normal in course and caliber. Mild cardiomegaly without pericardial effusion. Slightly ectatic thoracic aorta measuring up to 3.8 cm along the ascending portion. No dissection though study is tailored for the pulmonary arterial system. Coronary arteriosclerosis along the  LAD. Mediastinum/Nodes: No discrete thyroid nodules. Unremarkable esophagus. No pathologically enlarged axillary, mediastinal or hilar lymph nodes. Lungs/Pleura: No pneumothorax. Trace bilateral pleural fluid with ground-glass opacities within the lungs possibly representing hypoventilatory change, alveolitis or pneumonitis or possibly stigmata of mild pulmonary edema. Nodular opacity in the lateral segment of right middle lobe measuring 5.1 mm in average. Upper abdomen: Unremarkable. Musculoskeletal: No aggressive appearing focal osseous lesions. Degenerative change along the dorsal spine. Review of the MIP images confirms the above findings. IMPRESSION: 1. Coronary arteriosclerosis. 2. 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  No dissection. 3. No acute pulmonary embolus. 4. 5.1 mm right middle lobe nodule. Non-contrast chest CT at 6-12 months is recommended. If the nodule is stable at time of repeat CT, then future CT at 18-24 months (from today's scan) is considered optional for low-risk patients, but is recommended for high-risk  patients. This recommendation follows the consensus statement: Guidelines for Management of Incidental Pulmonary Nodules Detected on CT Images: From the Fleischner Society 2017; Radiology 2017; 284:228-243. Aortic Atherosclerosis (ICD10-I70.0). Electronically Signed   By: Tollie Ethavid  Kwon M.D.   On: 01/08/2018 20:18    Scheduled Meds: . ondansetron      . amLODipine  10 mg Oral Daily  . carvedilol  12.5 mg Oral BID WC  . chlorthalidone  25 mg Oral Daily  . enoxaparin (LOVENOX) injection  80 mg Subcutaneous Q24H  . famotidine  20 mg Oral BID  . fluticasone  2 spray Each Nare Daily  . glipiZIDE  5 mg Oral BID AC  . hydrALAZINE  25 mg Oral BID  . insulin aspart  0-9 Units Subcutaneous Q4H  . lisinopril  30 mg Oral Daily  . [START ON 01/10/2018] metFORMIN  500 mg Oral Q breakfast  . [START ON 01/10/2018] pneumococcal 23 valent vaccine  0.5 mL Intramuscular Tomorrow-1000  .  potassium chloride  40 mEq Oral Q4H  . rosuvastatin  30 mg Oral Daily  . sodium chloride flush  3 mL Intravenous Q12H  . spironolactone  25 mg Oral Daily      Time spent: 25 min  Meredeth IdeGagan S Christene Pounds   [facility] Pager [phone]. If 7PM-7AM, please contact night-coverage at [email or web address], Office  [phone]  password [email or web address]  01/09/2018, 2:01 PM  LOS: 1 day

## 2018-01-09 NOTE — Progress Notes (Signed)
01/09/2018 3:34 PM Pt transferred to 2H-19 per SWAT nurses. Kathryne HitchAllen, Enoc Getter C

## 2018-01-09 NOTE — Consult Note (Addendum)
PULMONARY / CRITICAL CARE MEDICINE  Name: Jenna Stokes MRN: [id number] DOB: 06/10/1965    ADMISSION DATE:  01/08/2018 CONSULTATION DATE:  01/09/18  REFERRING MD :  Sharl Ma  CHIEF COMPLAINT:  HTN Urgency   HISTORY OF PRESENT ILLNESS:  Jenna Stokes is a 52 y.o. female with a PMH as outlined below including uncontrolled HTN due to medication noncompliance, HLD, DM2, morbid obesity.  She has hx of non compliance with all medications and admits to only taking prescribed medications maybe 2 - 3 days per month. She was seen by PCP on 8/8 due to 1 week worsening dyspnea, orthopnea, PND, near syncope, chest discomfort.  Chest discomfort described as "thumping".  At PCP office, BP was 190/125; therefore, she was instructed to come to ED for further evaluation.  In ED, she was once again hypertensive with SBP in mid low to mid 200's.  She had CTA of the chest that demonstrated no PE but did show coronary arteriosclerosis and 5.32mm RML nodule.  She was admitted by [facility] and started on nitro gtt due to hypertensive urgency.  Mid day 8/9, she had minimal improvement in BP with SBP as high as 239.  She also began to complain of headache that she knows started shortly after nitro was started.  Currently she has some nausea but feels very hungry.  She denies any active chest pain, dyspnea, lightheadedness, blurry vision.  Cardiology was called in consultation by primary team due to coronary arteriosclerosis, and PCCM called for assistance with HTN urgency.  She was started on cardene in the interim.  She had LHC in May 2012 that demonstrated normal coronaries and normal LV function but did show hypertensive heart disease.  She had echo in May 2017 that showed EF 75%, G1DD, severe concentric LV wall thickening.   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  has a past surgical  history that includes Tonsillectomy; Abdominal hysterectomy; and Cesarean section. Prior to Admission medications   Medication Sig Start Date End Date Taking? Authorizing Provider  amLODipine (NORVASC) 10 MG tablet Take 1 tablet (10 mg total) by mouth daily. 07/01/17  Yes [name], DO  carvedilol (COREG) 12.5 MG tablet Take 1 tablet (12.5 mg total) by mouth 2 (two) times daily with a meal. 07/01/17  Yes [name], DO  glipiZIDE (GLUCOTROL) 5 MG tablet Take 1 tablet (5 mg total) by mouth 2 (two) times daily before a meal. 07/01/17  Yes [name], DO  lisinopril (PRINIVIL,ZESTRIL) 30 MG tablet Take 1 tablet (30 mg total) by mouth daily. 03/31/17  Yes [name], DO  potassium chloride SA (K-DUR,KLOR-CON) 20 MEQ tablet Take 1 tablet (20 mEq total) by mouth daily. 07/01/17  Yes Terressa Koyanagi, DO  chlorthalidone (HYGROTON) 25 MG tablet Take 1 tablet (25 mg total) by mouth daily. Patient not taking: Reported on 01/08/2018 07/01/17   Terressa Koyanagi, DO  fluticasone [facility]) 50 MCG/ACT nasal spray Place 2 sprays into both nostrils daily. Patient not taking: Reported on 01/08/2018 07/01/17   Terressa Koyanagi, DO  hydrALAZINE (APRESOLINE) 25 MG tablet Take 1 tablet (25 mg total) by mouth 2 (two) times daily. Patient not taking: Reported on 01/08/2018 07/11/16   Terressa Koyanagi, DO  metFORMIN (GLUCOPHAGE-XR) 500 MG 24 hr tablet Take 1 tablet (500 mg total) by mouth daily with breakfast. Patient not taking:  Reported on 01/08/2018 07/01/17   Terressa Koyanagi, DO  rosuvastatin (CRESTOR) 20 MG tablet Take 1.5 tablets (30 mg total) by mouth daily. Patient not taking: Reported on 01/08/2018 02/21/17 05/22/17  Terressa Koyanagi, DO   Allergies  Allergen Reactions  . Penicillins Other (See Comments)    Unknown childhood reaction Has patient had a PCN reaction causing immediate rash, facial/tongue/throat swelling, SOB or lightheadedness with hypotension: Unknown Has patient had a PCN reaction causing severe rash involving mucus  membranes or skin necrosis: Unknown Has patient had a PCN reaction that required hospitalization: Unknown Has patient had a PCN reaction occurring within the last 10 years: No If all of the above answers are "NO", then may proceed with Cephalosporin use.     FAMILY HISTORY:  family history includes Adrenal disorder in her sister; Diabetes in her mother and sister; Heart disease in her mother; Hypertension in her sister. SOCIAL HISTORY:  reports that she has never smoked. She has never used smokeless tobacco. She reports that she does not drink alcohol or use drugs.  REVIEW OF SYSTEMS:   All negative; except for those that are bolded, which indicate positives.  Constitutional: weight loss, weight gain, night sweats, fevers, chills, fatigue, weakness.  HEENT: headaches, sore throat, sneezing, nasal congestion, post nasal drip, difficulty swallowing, tooth/dental problems, visual complaints, visual changes, ear aches. Neuro: difficulty with speech, weakness, numbness, ataxia. CV:  chest pain - currently resolved, orthopnea, PND, swelling in lower extremities, dizziness, palpitations, syncope.  Resp: cough, hemoptysis, dyspnea - currently resolved, wheezing. GI: heartburn, indigestion, abdominal pain, nausea, vomiting, diarrhea, constipation, change in bowel habits, loss of appetite, hematemesis, melena, hematochezia.  GU: dysuria, change in color of urine, urgency or frequency, flank pain, hematuria. MSK: joint pain or swelling, decreased range of motion. Psych: change in mood or affect, depression, anxiety, suicidal ideations, homicidal ideations. Skin: rash, itching, bruising.   SUBJECTIVE:  Begging for food.  Denies lightheadedness, blurry vision, chest pain, dyspnea.  SBP currently 211 but was as high as 239 this AM.  VITAL SIGNS: Temp:  [97.9 F (36.6 C)-98.9 F (37.2 C)] 98 F (36.7 C) (08/09 1147) Pulse Rate:  [60-74] 61 (08/09 1147) Resp:  [11-22] 15 (08/09 1100) BP:  (151-239)/(62-125) 221/90 (08/09 1255) SpO2:  [97 %-100 %] 98 % (08/09 0415) Weight:  [164.7 kg-166 kg] 164.7 kg (08/09 0415)  PHYSICAL EXAMINATION: General: Adult female, morbidly obese, resting in bed, in NAD.  Family at bedside. Neuro: A&O x 3, no deficits. HEENT: /AT. Sclerae anicteric, EOMI. Cardiovascular: RRR, no M/R/G.  Lungs: Respirations even and unlabored.  CTA bilaterally, No W/R/R. Abdomen: Morbidly obese, BS x 4, soft, NT/ND.  Musculoskeletal: No gross deformities, no edema.  Skin: Intact, warm, no rashes.   Recent Labs  Lab 01/08/18 1606 01/09/18 0239  NA 140 138  K 3.0* 3.0*  CL 100 102  CO2 30 28  BUN 12 12  CREATININE 0.91 0.91  GLUCOSE 118* 170*   Recent Labs  Lab 01/08/18 1606 01/09/18 0239  HGB 12.7 11.3*  HCT 39.8 34.9*  WBC 11.6* 9.2  PLT 216 172   Dg Chest 2 View  Result Date: 01/08/2018 CLINICAL DATA:  CP, SOB since Monday. Hx of hypertension, diabetes. Nonsmoker. EXAM: CHEST - 2 VIEW COMPARISON:  Chest x-ray dated 07/11/2016. FINDINGS: Stable cardiomegaly. Lungs are clear. No pleural effusion or pneumothorax seen. Osseous structures are unremarkable. IMPRESSION: Stable cardiomegaly. No active cardiopulmonary disease. No evidence of pneumonia or pulmonary edema. Electronically Signed  By: Bary Richard M.D.   On: 01/08/2018 16:54   Ct Angio Chest Pe W/cm &/or Wo Cm  Result Date: 01/08/2018 CLINICAL DATA:  Hypertension and dyspnea since Monday. EXAM: CT ANGIOGRAPHY CHEST WITH CONTRAST TECHNIQUE: Multidetector CT imaging of the chest was performed using the standard protocol during bolus administration of intravenous contrast. Multiplanar CT image reconstructions and MIPs were obtained to evaluate the vascular anatomy. CONTRAST:  80 cc ISOVUE-370 IOPAMIDOL (ISOVUE-370) INJECTION 76% COMPARISON:  CXR 01/08/2018 FINDINGS: Cardiovascular: The study is of quality for the evaluation of pulmonary embolism. There are no filling defects in the central, lobar,  segmental or subsegmental pulmonary artery branches to suggest acute pulmonary embolism. Great vessels are normal in course and caliber. Mild cardiomegaly without pericardial effusion. Slightly ectatic thoracic aorta measuring up to 3.8 cm along the ascending portion. No dissection though study is tailored for the pulmonary arterial system. Coronary arteriosclerosis along the LAD. Mediastinum/Nodes: No discrete thyroid nodules. Unremarkable esophagus. No pathologically enlarged axillary, mediastinal or hilar lymph nodes. Lungs/Pleura: No pneumothorax. Trace bilateral pleural fluid with ground-glass opacities within the lungs possibly representing hypoventilatory change, alveolitis or pneumonitis or possibly stigmata of mild pulmonary edema. Nodular opacity in the lateral segment of right middle lobe measuring 5.1 mm in average. Upper abdomen: Unremarkable. Musculoskeletal: No aggressive appearing focal osseous lesions. Degenerative change along the dorsal spine. Review of the MIP images confirms the above findings. IMPRESSION: 1. Coronary arteriosclerosis. 2. 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  No dissection. 3. No acute pulmonary embolus. 4. 5.1 mm right middle lobe nodule. Non-contrast chest CT at 6-12 months is recommended. If the nodule is stable at time of repeat CT, then future CT at 18-24 months (from today's scan) is considered optional for low-risk patients, but is recommended for high-risk patients. This recommendation follows the consensus statement: Guidelines for Management of Incidental Pulmonary Nodules Detected on CT Images: From the Fleischner Society 2017; Radiology 2017; 284:228-243. Aortic Atherosclerosis (ICD10-I70.0). Electronically Signed   By: Tollie Eth M.D.   On: 01/08/2018 20:18    STUDIES:  CTA chest 8/9 > no PE, coronary arteriosclerosis and 5.64mm RML nodule Echo 8/9 >  Renal artery duplex 8/9 >   SIGNIFICANT EVENTS  8/8 > admit. 8/9 > PCCM and cardiology consult,  transferred to ICU.  ASSESSMENT / PLAN:  Hypertensive Urgency - in the setting of long hx of medication non-compliance.  Current SBP 211; however, as high as 239 earlier this AM.  Was started on nitro on admission but has had headache since this was started.  Has not taken her prescribed outpatient PO meds yet. Plan: D/c nitro gtt and start nicardipine in it's place.  Suspect will be able to transition off cardene fairly soon after current outpatient PO meds are resumed. Recommend goal SBP ~ 160 - 170 (given long hx uncontrolled HTN, would not want to acutely drop further than this but rather, gradually lower to SBP < 130). Resume preadmission amlodipine, carvedilol, chlorthalidone, hydralazine, lisinopril, crestor. Continue spironolactone (added on per cards - would continue on d/c). Added IV hydralazine PRN. If further control is needed, can consider adding clonidine. F/u on renal artery dopplers to assess for RAS (already ordered by cards). F/u on echo (already ordered by cards). Importance of medication compliance / close attention to her health was stressed to pt and family (risks explained including but not limited to death).  Chest discomfort - presumed due to HTN.  Troponin and EKG normal.  Currently chest pain free.  Plan: Control BP as above. Morphine PRN. Would hold nitro given headaches.  Hx DM - on oral meds but hx of med non-compliance. Plan: Assess Hgb A1c. Continue preadmission glipizide, metformin. SSI as needed.  Hypokalemia - being repleted. Plan: Follow BMP.  5.601mm RML nodule - low risk for malignancy given neg smoking hx. Plan: Repeat chest CT in 6 - 12 months.   Pt has already been transferred to the ICU and cardene has been initiated.  I have resumed all of her outpatient antihypertensives and suspect that once these begin to take effect, we will be able to transition off of cardene. Hopeful for transfer out of ICU by tomorrow 8/10.  Pt and family  updated on plan and questions answered.   CC time: 30 min.     Rutherford Guysahul Atina Feeley, [place] - [facility] Pager: [phone]  or [phone] 01/09/2018, 12:57 PM

## 2018-01-10 DIAGNOSIS — I160 Hypertensive urgency: Secondary | ICD-10-CM

## 2018-01-10 LAB — GLUCOSE, CAPILLARY
GLUCOSE-CAPILLARY: 93 mg/dL (ref 70–99)
GLUCOSE-CAPILLARY: 142 mg/dL — AB (ref 70–99)
GLUCOSE-CAPILLARY: 172 mg/dL — AB (ref 70–99)
Glucose-Capillary: 102 mg/dL — ABNORMAL HIGH (ref 70–99)
Glucose-Capillary: 123 mg/dL — ABNORMAL HIGH (ref 70–99)
Glucose-Capillary: 140 mg/dL — ABNORMAL HIGH (ref 70–99)

## 2018-01-10 LAB — BASIC METABOLIC PANEL
Anion gap: 9 (ref 5–15)
BUN: 11 mg/dL (ref 6–20)
CALCIUM: 9.5 mg/dL (ref 8.9–10.3)
CO2: 26 mmol/L (ref 22–32)
CREATININE: 0.9 mg/dL (ref 0.44–1.00)
Chloride: 103 mmol/L (ref 98–111)
Glucose, Bld: 89 mg/dL (ref 70–99)
Potassium: 3.4 mmol/L — ABNORMAL LOW (ref 3.5–5.1)
SODIUM: 138 mmol/L (ref 135–145)

## 2018-01-10 MED ORDER — POLYETHYLENE GLYCOL 3350 17 G PO PACK
17.0000 g | PACK | Freq: Every day | ORAL | Status: DC | PRN
  Administered 2018-01-10: 17 g via ORAL
  Filled 2018-01-10 (×2): qty 1

## 2018-01-10 MED ORDER — POTASSIUM CHLORIDE CRYS ER 20 MEQ PO TBCR
20.0000 meq | EXTENDED_RELEASE_TABLET | Freq: Two times a day (BID) | ORAL | Status: DC
  Administered 2018-01-10 – 2018-01-12 (×5): 20 meq via ORAL
  Filled 2018-01-10 (×5): qty 1

## 2018-01-10 MED ORDER — LISINOPRIL 40 MG PO TABS
40.0000 mg | ORAL_TABLET | Freq: Every day | ORAL | Status: DC
  Administered 2018-01-11 – 2018-01-12 (×2): 40 mg via ORAL
  Filled 2018-01-10: qty 1
  Filled 2018-01-10: qty 2

## 2018-01-10 MED ORDER — CARVEDILOL 6.25 MG PO TABS: 6.2500 mg | ORAL_TABLET | Freq: Two times a day (BID) | ORAL | Status: DC

## 2018-01-10 MED ORDER — CARVEDILOL 6.25 MG PO TABS
6.2500 mg | ORAL_TABLET | Freq: Two times a day (BID) | ORAL | Status: DC
  Administered 2018-01-10 (×2): 6.25 mg via ORAL
  Filled 2018-01-10 (×3): qty 1

## 2018-01-10 NOTE — Progress Notes (Signed)
Progress Note  Patient Name: Jenna Stokes Date of Encounter: 01/10/2018  Primary Cardiologist: Kristeen MissPhilip Nahser, MD   Subjective   Dyspnea improving; no chest pain  Inpatient Medications    Scheduled Meds: . amLODipine  10 mg Oral Daily  . chlorthalidone  25 mg Oral Daily  . enoxaparin (LOVENOX) injection  80 mg Subcutaneous Q24H  . famotidine  20 mg Oral BID  . glipiZIDE  5 mg Oral BID AC  . hydrALAZINE  25 mg Oral BID  . insulin aspart  0-9 Units Subcutaneous Q4H  . lisinopril  30 mg Oral Daily  . metFORMIN  500 mg Oral Q breakfast  . pneumococcal 23 valent vaccine  0.5 mL Intramuscular Tomorrow-1000  . potassium chloride  20 mEq Oral BID  . rosuvastatin  30 mg Oral Daily  . sodium chloride flush  3 mL Intravenous Q12H  . spironolactone  25 mg Oral Daily   Continuous Infusions: . sodium chloride    . niCARDipine 5 mg/hr (01/10/18 1200)   PRN Meds: sodium chloride, acetaminophen **OR** acetaminophen, hydrALAZINE, morphine injection, ondansetron (ZOFRAN) IV, polyethylene glycol, sodium chloride flush   Vital Signs    Vitals:   01/10/18 1100 01/10/18 1115 01/10/18 1130 01/10/18 1200  BP: (!) 173/72  (!) 149/69 (!) 158/68  Pulse:      Resp: 20  20 20   Temp:  97.6 F (36.4 C)    TempSrc:  Oral    SpO2:      Weight:      Height:        Intake/Output Summary (Last 24 hours) at 01/10/2018 1226 Last data filed at 01/10/2018 1200 Gross per 24 hour  Intake 2199.61 ml  Output 400 ml  Net 1799.61 ml   Filed Weights   01/08/18 2300 01/09/18 0415 01/10/18 0500  Weight: (!) 164.7 kg (!) 164.7 kg (!) 166 kg    Telemetry    Sinus with occasional couplet - Personally Reviewed   Physical Exam   GEN: No acute distress.  Morbidly obese Neck: Supple Cardiac: RRR, no murmurs, rubs, or gallops.  Respiratory: Clear to auscultation bilaterally. GI: Soft, nontender, non-distended  MS: No edema Neuro:  Nonfocal  Psych: Normal affect   Labs    Chemistry Recent  Labs  Lab 01/08/18 1606 01/09/18 0239 01/10/18 0303  NA 140 138 138  K 3.0* 3.0* 3.4*  CL 100 102 103  CO2 30 28 26   GLUCOSE 118* 170* 89  BUN 12 12 11   CREATININE 0.91 0.91 0.90  CALCIUM 9.6 9.4 9.5  PROT  --  5.9*  --   ALBUMIN  --  3.3*  --   AST  --  21  --   ALT  --  20  --   ALKPHOS  --  47  --   BILITOT  --  0.5  --   GFRNONAA >60 >60 >60  GFRAA >60 >60 >60  ANIONGAP 10 8 9      Hematology Recent Labs  Lab 01/08/18 1606 01/09/18 0239  WBC 11.6* 9.2  RBC 4.34 3.82*  HGB 12.7 11.3*  HCT 39.8 34.9*  MCV 91.7 91.4  MCH 29.3 29.6  MCHC 31.9 32.4  RDW 12.7 12.9  PLT 216 172    Cardiac Enzymes Recent Labs  Lab 01/08/18 2128 01/09/18 0239 01/09/18 0857  TROPONINI 0.04* 0.04* 0.04*    Recent Labs  Lab 01/08/18 1616  TROPIPOC 0.04     BNP Recent Labs  Lab 01/08/18 1606  BNP 137.2*     DDimer  Recent Labs  Lab 01/08/18 1813  DDIMER 0.99*     Radiology    Dg Chest 2 View  Result Date: 01/08/2018 CLINICAL DATA:  CP, SOB since Monday. Hx of hypertension, diabetes. Nonsmoker. EXAM: CHEST - 2 VIEW COMPARISON:  Chest x-ray dated 07/11/2016. FINDINGS: Stable cardiomegaly. Lungs are clear. No pleural effusion or pneumothorax seen. Osseous structures are unremarkable. IMPRESSION: Stable cardiomegaly. No active cardiopulmonary disease. No evidence of pneumonia or pulmonary edema. Electronically Signed   By: Bary Richard M.D.   On: 01/08/2018 16:54   Ct Angio Chest Pe W/cm &/or Wo Cm  Result Date: 01/08/2018 CLINICAL DATA:  Hypertension and dyspnea since Monday. EXAM: CT ANGIOGRAPHY CHEST WITH CONTRAST TECHNIQUE: Multidetector CT imaging of the chest was performed using the standard protocol during bolus administration of intravenous contrast. Multiplanar CT image reconstructions and MIPs were obtained to evaluate the vascular anatomy. CONTRAST:  80 cc ISOVUE-370 IOPAMIDOL (ISOVUE-370) INJECTION 76% COMPARISON:  CXR 01/08/2018 FINDINGS: Cardiovascular: The  study is of quality for the evaluation of pulmonary embolism. There are no filling defects in the central, lobar, segmental or subsegmental pulmonary artery branches to suggest acute pulmonary embolism. Great vessels are normal in course and caliber. Mild cardiomegaly without pericardial effusion. Slightly ectatic thoracic aorta measuring up to 3.8 cm along the ascending portion. No dissection though study is tailored for the pulmonary arterial system. Coronary arteriosclerosis along the LAD. Mediastinum/Nodes: No discrete thyroid nodules. Unremarkable esophagus. No pathologically enlarged axillary, mediastinal or hilar lymph nodes. Lungs/Pleura: No pneumothorax. Trace bilateral pleural fluid with ground-glass opacities within the lungs possibly representing hypoventilatory change, alveolitis or pneumonitis or possibly stigmata of mild pulmonary edema. Nodular opacity in the lateral segment of right middle lobe measuring 5.1 mm in average. Upper abdomen: Unremarkable. Musculoskeletal: No aggressive appearing focal osseous lesions. Degenerative change along the dorsal spine. Review of the MIP images confirms the above findings. IMPRESSION: 1. Coronary arteriosclerosis. 2. 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  No dissection. 3. No acute pulmonary embolus. 4. 5.1 mm right middle lobe nodule. Non-contrast chest CT at 6-12 months is recommended. If the nodule is stable at time of repeat CT, then future CT at 18-24 months (from today's scan) is considered optional for low-risk patients, but is recommended for high-risk patients. This recommendation follows the consensus statement: Guidelines for Management of Incidental Pulmonary Nodules Detected on CT Images: From the Fleischner Society 2017; Radiology 2017; 284:228-243. Aortic Atherosclerosis (ICD10-I70.0). Electronically Signed   By: Tollie Eth M.D.   On: 01/08/2018 20:18    Patient Profile     TULANI KIDNEY is a 52 y.o. female with a hx hypertensive  heart disease (severe LVH and G1DD by echo), false positive NST in 2012 (inferior defect), but normal coronaries on cardiac cath, T2DM, HTN, HLD, morbid obesity, medication noncompliance, family h/o adrenal adenoma (sister) and family h/o CKD, who is being seen for the evaluation of chest pain and exertional dyspnea, in the setting of markedly elevated BP.  Echocardiogram this admission shows normal LV function, severe left ventricular hypertrophy, biatrial enlargement, mild mitral regurgitation, moderate right ventricular enlargement.  Assessment & Plan    1 Hypertensive urgency-patient's blood pressure has improved but remains mildly elevated.  We will wean nicardipine to off.  Change lisinopril to 40 mg daily.  Discontinue hydralazine given small dose and history of noncompliance.  Add carvedilol 6.25 mg twice daily.  Follow blood pressure and advance regimen as needed.  As  outlined would check renal Dopplers to exclude renal artery stenosis and work-up further for possible hyperaldosteronism.  2 noncompliance-patient instructed on the importance of medications for blood pressure.  3 chest pain-no further symptoms.  Enzymes not consistent with myocardial infarction.  Follow-up with Dr. Elease Hashimoto following discharge.  4 morbid obesity-needs weight loss.    5 pulmonary nodule-she will need follow-up with her primary care physician for imaging in 6 to 12 months.  For questions or updates, please contact [facility] Please consult [email or web address] for contact info under Cardiology/STEMI.      Signed, Olga Millers, MD  01/10/2018, 12:26 PM

## 2018-01-10 NOTE — Progress Notes (Signed)
Pulmonary/CCM  Jenna Stokes is a 52 y.o. female with a hx hypertensive heart disease (severe LVH and G1DD by echo), false positive NST in 2012 (inferior defect), but normal coronaries on cardiac cath, T2DM, HTN, HLD, morbid obesity, medication noncompliance, family h/o adrenal adenoma (sister) and family h/o CKD, who is being seen today for the evaluation of chest pain and exertional dyspnea, in the setting of markedly elevated BP, at the request of Dr. Sharl MaLama, Internal Medicine.   HPI  In the ED, additional w/u revealed negative acute findings on chest CT. No dissection or PE. However coronary arteriosclerosis noted along the LAD as well as an incidental finding of a 5.1 mm RML nodule. CXR showed stable cardiomegaly. No active cardiopulmonary disease. No evidence of pneumonia or pulmonary edema. EKG showed SR with frequent PACs and LVH w/ repol abnormalities. Troponin minimally elevated but with flat trend at 0.04>>0.04. WBC ct was initially abnormal at 11.6 but WNL today at 9.2. H/H with mild anemia at 11.3/34.9. Hypokalemia also noted w/ K at 3.0. SCr WNL at 0.91. Fasting BG this morning elevated at 170 mg/dL.   BP remains markedly elevated at 201/89 this morning. She is currently on IV nitro and ordered to get Coreg 12.5 mg BID.  Primary team has just ordered hydralazine Q6H. She is currently CP free but has a HA (pt states she did not develop a HA until IV nitro was started). No resting dyspnea sitting up but feels "smothered" laying down and has to sit up. Still notes exertional dyspnea.   8/10 The patient remains on a low dose carden infusion. Has been started back on PO meds. The patient is on Chlorthalidone, hydralazine and amlodipine and spironolactone She is feeling better overall. She is up sitting  In the chair.  Inpatient Medications: Scheduled Meds: . carvedilol  12.5 mg Oral BID WC  . enoxaparin (LOVENOX) injection  80 mg Subcutaneous Q24H  . famotidine  20 mg Oral BID  .  insulin aspart  0-9 Units Subcutaneous Q4H  . [START ON 01/10/2018] pneumococcal 23 valent vaccine  0.5 mL Intramuscular Tomorrow-1000  . potassium chloride  40 mEq Oral Q4H  . sodium chloride flush  3 mL Intravenous Q12H   Continuous Infusions: . sodium chloride    . niCARDipine    . nitroGLYCERIN 30 mcg/min (01/09/18 0819)   PRN Meds: sodium chloride, acetaminophen **OR** acetaminophen, hydrALAZINE, morphine injection, sodium chloride flush  Allergies:         Allergies  Allergen Reactions  . Penicillins Other (See Comments)    Unknown childhood reaction Has patient had a PCN reaction causing immediate rash, facial/tongue/throat swelling, SOB or lightheadedness with hypotension: Unknown Has patient had a PCN reaction causing severe rash involving mucus membranes or skin necrosis: Unknown Has patient had a PCN reaction that required hospitalization: Unknown Has patient had a PCN reaction occurring within the last 10 years: No If all of the above answers are "NO", then may proceed with Cephalosporin use.   Impression:: HTN urgency BP better controled. On low dose cardene and po regimen started. Will hopefully get off drip and may be able to move out of the ICU  Chest pain.  Her trops have been unremarkable. Has some new changes in the lateral precordial leads but may represent worsening LV strain related to her high bps. Defer to cardiolgy re the need for cath  Constipation Will start a bowel regimen on patient

## 2018-01-10 NOTE — Progress Notes (Signed)
[facility]  PROGRESS NOTE  Jenna Stokes ZOX:[id number] DOB: [birth year] DOA: 01/08/2018 PCP: Terressa Koyanagi, DO   Brief HPI:   52 year old female with a history of hypertension, hyperlipidemia, diabetes mellitus, type II, morbid obesity, mild mitral regurgitation came to hospital with chest pain, shortness of breath.  CT chest showed coronary calcifications, 5.1 mm right middle lobe nodule.      Subjective   Patient seen and examined, denies any chest pain.  Blood pressure is controlled since she is put on Cardene drip.P.o. regimen has been started.  Assessment/Plan:     1. Chest pain-resolved at this time.  Cardiology has been consulted.  Troponins times three 0.04.  No further intervention per cardiology at this time. 2. Hypertensive urgency-blood pressure was significantly elevated patient was started on IV nitroglycerin which was discontinued and patient started on Cardene drip and transferred to ICU.  At this time blood pressure is stable.  PCCM has been consulted.  Home regimen for hypertension has been started.  Cardene drip is being weaned off.  Continue amlodipine, chlorthalidone, hydralazine, lisinopril, Aldactone. 3. Hypokalemia-potassium is 2.4, will replace potassium and check BMP in a.m. 4. Diabetes mellitus-started on sliding scale insulin with NovoLog.  Blood glucose well controlled. 5. Lung nodule-seen on CT chest, patient will require outpatient noncontrast chest CT at 6 to 12 months    DVT prophylaxis: Lovenox  Code Status: Full code  Family Communication: No family at bedside  Disposition Plan: likely home when medically ready for discharge   Consultants:  None  Procedures:  None    Antibiotics:   Anti-infectives (From admission, onward)   None       Objective   Vitals:   01/10/18 1000 01/10/18 1030 01/10/18 1100 01/10/18 1115  BP: (!) 163/69 (!) 162/73 (!) 173/72   Pulse:      Resp: (!) 22 17 (!) 21   Temp:    97.6 F (36.4 C)   TempSrc:    Oral  SpO2:      Weight:      Height:        Intake/Output Summary (Last 24 hours) at 01/10/2018 1126 Last data filed at 01/10/2018 1100 Gross per 24 hour  Intake 1489.61 ml  Output 400 ml  Net 1089.61 ml   Filed Weights   01/08/18 2300 01/09/18 0415 01/10/18 0500  Weight: (!) 164.7 kg (!) 164.7 kg (!) 166 kg     Physical Examination:   General: Appears in no acute distress Cardiovascular: S1-S2, regular Respiratory: Clear to auscultation bilaterally, no wheezing Abdomen: Soft, nontender, no organomegaly Musculoskeletal: No edema to lower extremities noted.   Data Reviewed: I have personally reviewed following labs and imaging studies  CBG: Recent Labs  Lab 01/09/18 1148 01/09/18 1936 01/09/18 2340 01/10/18 0307 01/10/18 0720  GLUCAP 146* 207* 153* 93 142*    CBC: Recent Labs  Lab 01/08/18 1606 01/09/18 0239  WBC 11.6* 9.2  NEUTROABS 8.3*  --   HGB 12.7 11.3*  HCT 39.8 34.9*  MCV 91.7 91.4  PLT 216 172    Basic Metabolic Panel: Recent Labs  Lab 01/08/18 1606 01/09/18 0239 01/09/18 0857 01/10/18 0303  NA 140 138  --  138  K 3.0* 3.0*  --  3.4*  CL 100 102  --  103  CO2 30 28  --  26  GLUCOSE 118* 170*  --  89  BUN 12 12  --  11  CREATININE 0.91 0.91  --  0.90  CALCIUM  9.6 9.4  --  9.5  MG  --   --  2.1  --     Recent Results (from the past 240 hour(s))  MRSA PCR Screening     Status: None   Collection Time: 01/08/18 11:54 PM  Result Value Ref Range Status   MRSA by PCR NEGATIVE NEGATIVE Final    Comment:        The GeneXpert MRSA Assay (FDA approved for NASAL specimens only), is one component of a comprehensive MRSA colonization surveillance program. It is not intended to diagnose MRSA infection nor to guide or monitor treatment for MRSA infections. Performed at [facility], [address], [place], [place] [address]      Liver Function Tests: Recent Labs  Lab 01/09/18 0239  AST 21  ALT 20  ALKPHOS 47   BILITOT 0.5  PROT 5.9*  ALBUMIN 3.3*   No results for input(s): LIPASE, AMYLASE in the last 168 hours. No results for input(s): AMMONIA in the last 168 hours.  Cardiac Enzymes: Recent Labs  Lab 01/08/18 2128 01/09/18 0239 01/09/18 0857  TROPONINI 0.04* 0.04* 0.04*   BNP (last 3 results) Recent Labs    01/08/18 1606  BNP 137.2*    ProBNP (last 3 results) No results for input(s): PROBNP in the last 8760 hours.    Studies: Dg Chest 2 View  Result Date: 01/08/2018 CLINICAL DATA:  CP, SOB since Monday. Hx of hypertension, diabetes. Nonsmoker. EXAM: CHEST - 2 VIEW COMPARISON:  Chest x-ray dated 07/11/2016. FINDINGS: Stable cardiomegaly. Lungs are clear. No pleural effusion or pneumothorax seen. Osseous structures are unremarkable. IMPRESSION: Stable cardiomegaly. No active cardiopulmonary disease. No evidence of pneumonia or pulmonary edema. Electronically Signed   By: Bary Richard M.D.   On: 01/08/2018 16:54   Ct Angio Chest Pe W/cm &/or Wo Cm  Result Date: 01/08/2018 CLINICAL DATA:  Hypertension and dyspnea since Monday. EXAM: CT ANGIOGRAPHY CHEST WITH CONTRAST TECHNIQUE: Multidetector CT imaging of the chest was performed using the standard protocol during bolus administration of intravenous contrast. Multiplanar CT image reconstructions and MIPs were obtained to evaluate the vascular anatomy. CONTRAST:  80 cc ISOVUE-370 IOPAMIDOL (ISOVUE-370) INJECTION 76% COMPARISON:  CXR 01/08/2018 FINDINGS: Cardiovascular: The study is of quality for the evaluation of pulmonary embolism. There are no filling defects in the central, lobar, segmental or subsegmental pulmonary artery branches to suggest acute pulmonary embolism. Great vessels are normal in course and caliber. Mild cardiomegaly without pericardial effusion. Slightly ectatic thoracic aorta measuring up to 3.8 cm along the ascending portion. No dissection though study is tailored for the pulmonary arterial system. Coronary  arteriosclerosis along the LAD. Mediastinum/Nodes: No discrete thyroid nodules. Unremarkable esophagus. No pathologically enlarged axillary, mediastinal or hilar lymph nodes. Lungs/Pleura: No pneumothorax. Trace bilateral pleural fluid with ground-glass opacities within the lungs possibly representing hypoventilatory change, alveolitis or pneumonitis or possibly stigmata of mild pulmonary edema. Nodular opacity in the lateral segment of right middle lobe measuring 5.1 mm in average. Upper abdomen: Unremarkable. Musculoskeletal: No aggressive appearing focal osseous lesions. Degenerative change along the dorsal spine. Review of the MIP images confirms the above findings. IMPRESSION: 1. Coronary arteriosclerosis. 2. 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  No dissection. 3. No acute pulmonary embolus. 4. 5.1 mm right middle lobe nodule. Non-contrast chest CT at 6-12 months is recommended. If the nodule is stable at time of repeat CT, then future CT at 18-24 months (from today's scan) is considered optional for low-risk patients, but is  recommended for high-risk patients. This recommendation follows the consensus statement: Guidelines for Management of Incidental Pulmonary Nodules Detected on CT Images: From the Fleischner Society 2017; Radiology 2017; 284:228-243. Aortic Atherosclerosis (ICD10-I70.0). Electronically Signed   By: Tollie Ethavid  Kwon M.D.   On: 01/08/2018 20:18    Scheduled Meds: . amLODipine  10 mg Oral Daily  . chlorthalidone  25 mg Oral Daily  . enoxaparin (LOVENOX) injection  80 mg Subcutaneous Q24H  . famotidine  20 mg Oral BID  . glipiZIDE  5 mg Oral BID AC  . hydrALAZINE  25 mg Oral BID  . insulin aspart  0-9 Units Subcutaneous Q4H  . lisinopril  30 mg Oral Daily  . metFORMIN  500 mg Oral Q breakfast  . pneumococcal 23 valent vaccine  0.5 mL Intramuscular Tomorrow-1000  . potassium chloride  20 mEq Oral BID  . rosuvastatin  30 mg Oral Daily  . sodium chloride flush  3 mL Intravenous  Q12H  . spironolactone  25 mg Oral Daily      Time spent: 25 min  Meredeth IdeGagan S Adir Schicker   [facility] Pager [phone]. If 7PM-7AM, please contact night-coverage at [email or web address], Office  [phone]  password [email or web address]  01/10/2018, 11:26 AM  LOS: 2 days

## 2018-01-10 NOTE — Progress Notes (Addendum)
IV Team RN at bedside to obtain second PIV site.  Attempt x1 unsuccessful and patient refused additional attempt.  Hospitalist at bedside verbalized agreement with patient having one PIV at this time.  Hopeful that Cardene gtt will be discontinued.  Patient and physician educated that if that IV loses patency, I have no access to administer emergency medications.  Patient agreeable to second IV insertion if unable to discontinue Cardene.

## 2018-01-11 DIAGNOSIS — R911 Solitary pulmonary nodule: Secondary | ICD-10-CM

## 2018-01-11 DIAGNOSIS — I10 Essential (primary) hypertension: Secondary | ICD-10-CM

## 2018-01-11 DIAGNOSIS — E119 Type 2 diabetes mellitus without complications: Secondary | ICD-10-CM

## 2018-01-11 DIAGNOSIS — R072 Precordial pain: Secondary | ICD-10-CM

## 2018-01-11 LAB — GLUCOSE, CAPILLARY
GLUCOSE-CAPILLARY: 130 mg/dL — AB (ref 70–99)
GLUCOSE-CAPILLARY: 109 mg/dL — AB (ref 70–99)
Glucose-Capillary: 129 mg/dL — ABNORMAL HIGH (ref 70–99)
Glucose-Capillary: 150 mg/dL — ABNORMAL HIGH (ref 70–99)

## 2018-01-11 LAB — BASIC METABOLIC PANEL
ANION GAP: 7 (ref 5–15)
BUN: 11 mg/dL (ref 6–20)
CALCIUM: 9.7 mg/dL (ref 8.9–10.3)
CO2: 27 mmol/L (ref 22–32)
Chloride: 101 mmol/L (ref 98–111)
Creatinine, Ser: 0.92 mg/dL (ref 0.44–1.00)
GLUCOSE: 148 mg/dL — AB (ref 70–99)
POTASSIUM: 3.8 mmol/L (ref 3.5–5.1)
Sodium: 135 mmol/L (ref 135–145)

## 2018-01-11 MED ORDER — CARVEDILOL 12.5 MG PO TABS
12.5000 mg | ORAL_TABLET | Freq: Two times a day (BID) | ORAL | Status: DC
  Administered 2018-01-11 – 2018-01-12 (×3): 12.5 mg via ORAL
  Filled 2018-01-11 (×4): qty 1

## 2018-01-11 MED ORDER — INSULIN ASPART 100 UNIT/ML ~~LOC~~ SOLN
0.0000 [IU] | SUBCUTANEOUS | Status: DC
  Administered 2018-01-11 (×4): 1 [IU] via SUBCUTANEOUS

## 2018-01-11 MED ORDER — INSULIN ASPART 100 UNIT/ML ~~LOC~~ SOLN
0.0000 [IU] | Freq: Three times a day (TID) | SUBCUTANEOUS | Status: DC
  Administered 2018-01-12: 1 [IU] via SUBCUTANEOUS

## 2018-01-11 MED ORDER — HYDRALAZINE HCL 20 MG/ML IJ SOLN
10.0000 mg | Freq: Once | INTRAMUSCULAR | Status: AC
  Administered 2018-01-11: 10 mg via INTRAVENOUS
  Filled 2018-01-11: qty 1

## 2018-01-11 MED ORDER — HYDRALAZINE HCL 25 MG PO TABS
25.0000 mg | ORAL_TABLET | Freq: Four times a day (QID) | ORAL | Status: DC | PRN
  Administered 2018-01-11 – 2018-01-12 (×2): 25 mg via ORAL
  Filled 2018-01-11 (×2): qty 1

## 2018-01-11 NOTE — Progress Notes (Signed)
Received pt from 2H, she is in bed with no complains.

## 2018-01-11 NOTE — Progress Notes (Signed)
Progress Note  Patient Name: Jenna Stokes Date of Encounter: 01/11/2018  Primary Cardiologist: Kristeen Miss, MD   Subjective   No CP or dyspnea  Inpatient Medications    Scheduled Meds: . amLODipine  10 mg Oral Daily  . carvedilol  6.25 mg Oral BID WC  . chlorthalidone  25 mg Oral Daily  . enoxaparin (LOVENOX) injection  80 mg Subcutaneous Q24H  . famotidine  20 mg Oral BID  . glipiZIDE  5 mg Oral BID AC  . insulin aspart  0-9 Units Subcutaneous Q4H  . lisinopril  40 mg Oral Daily  . metFORMIN  500 mg Oral Q breakfast  . pneumococcal 23 valent vaccine  0.5 mL Intramuscular Tomorrow-1000  . potassium chloride  20 mEq Oral BID  . rosuvastatin  30 mg Oral Daily  . sodium chloride flush  3 mL Intravenous Q12H  . spironolactone  25 mg Oral Daily   Continuous Infusions: . sodium chloride     PRN Meds: sodium chloride, acetaminophen **OR** acetaminophen, hydrALAZINE, morphine injection, ondansetron (ZOFRAN) IV, polyethylene glycol, sodium chloride flush   Vital Signs    Vitals:   01/11/18 0500 01/11/18 0530 01/11/18 0600 01/11/18 0630  BP: (!) 173/96 (!) 138/57 (!) 170/75 (!) 158/85  Pulse:      Resp: (!) 22 18 15 16   Temp:      TempSrc:      SpO2:      Weight:      Height:        Intake/Output Summary (Last 24 hours) at 01/11/2018 0812 Last data filed at 01/11/2018 0000 Gross per 24 hour  Intake 1911.35 ml  Output 300 ml  Net 1611.35 ml   Filed Weights   01/09/18 0415 01/10/18 0500 01/11/18 0451  Weight: (!) 164.7 kg (!) 166 kg (!) 167.9 kg    Telemetry    Sinus with occasional PVC - Personally Reviewed   Physical Exam   GEN: WD No acute distress.  Morbidly obese Neck: Supple, JVP difficult to assess Cardiac: RRR Respiratory: Clear to auscultation bilaterally; no wheeze GI: Soft, NT, ND MS: No edema Neuro:  Grossly intact   Labs    Chemistry Recent Labs  Lab 01/09/18 0239 01/10/18 0303 01/11/18 0707  NA 138 138 135  K 3.0* 3.4* 3.8    CL 102 103 101  CO2 28 26 27   GLUCOSE 170* 89 148*  BUN 12 11 11   CREATININE 0.91 0.90 0.92  CALCIUM 9.4 9.5 9.7  PROT 5.9*  --   --   ALBUMIN 3.3*  --   --   AST 21  --   --   ALT 20  --   --   ALKPHOS 47  --   --   BILITOT 0.5  --   --   GFRNONAA >60 >60 >60  GFRAA >60 >60 >60  ANIONGAP 8 9 7      Hematology Recent Labs  Lab 01/08/18 1606 01/09/18 0239  WBC 11.6* 9.2  RBC 4.34 3.82*  HGB 12.7 11.3*  HCT 39.8 34.9*  MCV 91.7 91.4  MCH 29.3 29.6  MCHC 31.9 32.4  RDW 12.7 12.9  PLT 216 172    Cardiac Enzymes Recent Labs  Lab 01/08/18 2128 01/09/18 0239 01/09/18 0857  TROPONINI 0.04* 0.04* 0.04*    Recent Labs  Lab 01/08/18 1616  TROPIPOC 0.04     BNP Recent Labs  Lab 01/08/18 1606  BNP 137.2*     DDimer  Recent Labs  Lab 01/08/18 1813  DDIMER 0.99*    Patient Profile     Jenna Stokes is a 52 y.o. female with a hx hypertensive heart disease (severe LVH and G1DD by echo), false positive NST in 2012 (inferior defect), but normal coronaries on cardiac cath, T2DM, HTN, HLD, morbid obesity, medication noncompliance, family h/o adrenal adenoma (sister) and family h/o CKD, who is being seen for the evaluation of chest pain and exertional dyspnea, in the setting of markedly elevated BP.  Echocardiogram this admission shows normal LV function, severe left ventricular hypertrophy, biatrial enlargement, mild mitral regurgitation, moderate right ventricular enlargement.  Assessment & Plan    1 Hypertensive urgency-patient's blood pressure remains mildly elevated.  We will increase coreg to 12.5 mg BID. As outlined would check renal Dopplers to exclude renal artery stenosis and work-up further for possible hyperaldosteronism.  If blood pressure remains elevated may need to resume hydralazine and increase as needed.  Patient can be transferred to telemetry from a cardiac standpoint.  2 noncompliance-patient previously instructed on the importance of  medications for blood pressure.  3 chest pain-no chest pain this morning.  She will follow-up with Dr. Elease HashimotoNahser for discussion of further ischemia evaluation after discharge.  4 morbid obesity-needs weight loss.    5 pulmonary nodule-she will need follow-up with her primary care physician for imaging in 6 to 12 months.  For questions or updates, please contact [facility] Please consult [email or web address] for contact info under Cardiology/STEMI.      Signed, Olga MillersBrian Bridgitt Raggio, MD  01/11/2018, 8:12 AM

## 2018-01-11 NOTE — Progress Notes (Addendum)
Patient refused laxative at present. States "I'll take it later today." Patient had a small hard dry stool yesterday.  Patient continues with depressed affect.  Emotional support and therapeutic communication provided.

## 2018-01-11 NOTE — Progress Notes (Signed)
Pulmonary/CCM  Jenna Stokes is a 52 y.o. female with a hx hypertensive heart disease (severe LVH and G1DD by echo), false positive NST in 2012 (inferior defect), but normal coronaries on cardiac cath, T2DM, HTN, HLD, morbid obesity, medication noncompliance, family h/o adrenal adenoma (sister) and family h/o CKD, who is being seen today for the evaluation of chest pain and exertional dyspnea, in the setting of markedly elevated BP, at the request of Dr. Sharl Ma, Internal Medicine.   HPI  In the ED, additional w/u revealed negative acute findings on chest CT. No dissection or PE. However coronary arteriosclerosis noted along the LAD as well as an incidental finding of a 5.1 mm RML nodule. CXR showed stable cardiomegaly. No active cardiopulmonary disease. No evidence of pneumonia or pulmonary edema. EKG showed SR with frequent PACs and LVH w/ repol abnormalities. Troponin minimally elevated but with flat trend at 0.04>>0.04. WBC ct was initially abnormal at 11.6 but WNL today at 9.2. H/H with mild anemia at 11.3/34.9. Hypokalemia also noted w/ K at 3.0. SCr WNL at 0.91. Fasting BG this morning elevated at 170 mg/dL.   BP remains markedly elevated at 201/89 this morning. She is currently on IV nitro and ordered to get Coreg 12.5 mg BID.  Primary team has just ordered hydralazine Q6H. She is currently CP free but has a HA (pt states she did not develop a HA until IV nitro was started). No resting dyspnea sitting up but feels "smothered" laying down and has to sit up. Still notes exertional dyspnea.   8/10 The patient remains on a low dose carden infusion. Has been started back on PO meds. The patient is on Chlorthalidone, hydralazine and amlodipine and spironolactone She is feeling better overall. She is up sitting  In the chair.  Inpatient Medications: Scheduled Meds: . carvedilol  12.5 mg Oral BID WC  . enoxaparin (LOVENOX) injection  80 mg Subcutaneous Q24H  . famotidine  20 mg Oral BID  .  insulin aspart  0-9 Units Subcutaneous Q4H  . [START ON 01/10/2018] pneumococcal 23 valent vaccine  0.5 mL Intramuscular Tomorrow-1000  . potassium chloride  40 mEq Oral Q4H  . sodium chloride flush  3 mL Intravenous Q12H   Continuous Infusions: . sodium chloride    . niCARDipine    . nitroGLYCERIN 30 mcg/min (01/09/18 0819)   PRN Meds: sodium chloride, acetaminophen **OR** acetaminophen, hydrALAZINE, morphine injection, sodium chloride flush  Allergies:         Allergies  Allergen Reactions  . Penicillins Other (See Comments)    Unknown childhood reaction Has patient had a PCN reaction causing immediate rash, facial/tongue/throat swelling, SOB or lightheadedness with hypotension: Unknown Has patient had a PCN reaction causing severe rash involving mucus membranes or skin necrosis: Unknown Has patient had a PCN reaction that required hospitalization: Unknown Has patient had a PCN reaction occurring within the last 10 years: No If all of the above answers are "NO", then may proceed with Cephalosporin use.   Impression:: HTN urgency BP better controled. On low dose cardene and po regimen started. Will hopefully get off drip and may be able to move out of the ICU.  I have requested renal artery doppler and renin/aldosterone ratio although in the context of of spironolactone and ACE it may be difficult to interpret.   Canporoabbly leave theICU. I will suignoff at this time.   Chest pain.  Her trops have been unremarkable. Has some new changes in the lateral precordial leads but may  represent worsening LV strain related to her high bps. Defer to cardiolgy re the need for cath.  Constipation Will start a bowel regimen on patient.  I will signoff today

## 2018-01-11 NOTE — Plan of Care (Signed)
  Problem: Clinical Measurements: Goal: Respiratory complications will improve Outcome: Progressing Goal: Cardiovascular complication will be avoided Outcome: Progressing   Problem: Activity: Goal: Risk for activity intolerance will decrease Outcome: Progressing   Problem: Nutrition: Goal: Adequate nutrition will be maintained Outcome: Progressing   Problem: Elimination: Goal: Will not experience complications related to urinary retention Outcome: Progressing   Problem: Pain Managment: Goal: General experience of comfort will improve Outcome: Progressing   Problem: Safety: Goal: Ability to remain free from injury will improve Outcome: Progressing   Problem: Skin Integrity: Goal: Risk for impaired skin integrity will decrease Outcome: Progressing   

## 2018-01-11 NOTE — Progress Notes (Signed)
[facility]  PROGRESS NOTE  Jenna Stokes WUJ:[id number] DOB: 1965-12-06 DOA: 01/08/2018 PCP: Terressa KoyanagiKim, Hannah R, DO   Brief HPI:   52 year old female with a history of hypertension, hyperlipidemia, diabetes mellitus, type II, morbid obesity, mild mitral regurgitation came to hospital with chest pain, shortness of breath.  CT chest showed coronary calcifications, 5.1 mm right middle lobe nodule.      Subjective   Patient seen and examined, denies chest pain or shortness of breath.  Blood pressure is fairly well controlled.   Assessment/Plan:     1. Chest pain-resolved at this time.  Cardiology has been consulted.  Troponins times three 0.04.  No further intervention per cardiology at this time.  She will follow-up with cardiology as outpatient for further work-up for ischemic heart disease. 2. Hypertensive urgency-resolved, blood pressure was significantly elevated patient was started on IV nitroglycerin which was discontinued and patient started on Cardene drip and transferred to ICU.  At this time blood pressure is stable.  PCCM has been consulted.  Home regimen for hypertension has been started.  Cardene drip has been weaned off.   Continue amlodipine, chlorthalidone, hydralazine, lisinopril, Aldactone, Coreg. 3. Hypokalemia-replete, today potassium is 3.8. 4. Diabetes mellitus-started on sliding scale insulin with NovoLog.  Blood glucose well controlled. 5. Lung nodule-seen on CT chest, patient will require outpatient noncontrast chest CT at 6 to 12 months    DVT prophylaxis: Lovenox  Code Status: Full code  Family Communication: No family at bedside  Disposition Plan: likely home when medically ready for discharge   Consultants:  None  Procedures:  None    Antibiotics:   Anti-infectives (From admission, onward)   None       Objective   Vitals:   01/11/18 0930 01/11/18 1045 01/11/18 1105 01/11/18 1106  BP: (!) 176/85 (!) 185/80 (!) 195/85 (!) 195/85   Pulse:      Resp: 16 18 17    Temp:      TempSrc:      SpO2:      Weight:      Height:        Intake/Output Summary (Last 24 hours) at 01/11/2018 1145 Last data filed at 01/11/2018 0900 Gross per 24 hour  Intake 2230.07 ml  Output 300 ml  Net 1930.07 ml   Filed Weights   01/09/18 0415 01/10/18 0500 01/11/18 0451  Weight: (!) 164.7 kg (!) 166 kg (!) 167.9 kg     Physical Examination:   General: Appears in no acute distress Cardiovascular: S1-S2, regular, no murmurs auscultated Respiratory: Clear to auscultation bilaterally, no wheezing or crackles. Abdomen: Soft, nontender, no organomegaly. Musculoskeletal: No edema of the lower extremities.    Data Reviewed: I have personally reviewed following labs and imaging studies  CBG: Recent Labs  Lab 01/10/18 1116 01/10/18 1554 01/10/18 1928 01/10/18 2347 01/11/18 0730  GLUCAP 140* 102* 172* 123* 129*    CBC: Recent Labs  Lab 01/08/18 1606 01/09/18 0239  WBC 11.6* 9.2  NEUTROABS 8.3*  --   HGB 12.7 11.3*  HCT 39.8 34.9*  MCV 91.7 91.4  PLT 216 172    Basic Metabolic Panel: Recent Labs  Lab 01/08/18 1606 01/09/18 0239 01/09/18 0857 01/10/18 0303 01/11/18 0707  NA 140 138  --  138 135  K 3.0* 3.0*  --  3.4* 3.8  CL 100 102  --  103 101  CO2 30 28  --  26 27  GLUCOSE 118* 170*  --  89 148*  BUN  12 12  --  11 11  CREATININE 0.91 0.91  --  0.90 0.92  CALCIUM 9.6 9.4  --  9.5 9.7  MG  --   --  2.1  --   --     Recent Results (from the past 240 hour(s))  MRSA PCR Screening     Status: None   Collection Time: 01/08/18 11:54 PM  Result Value Ref Range Status   MRSA by PCR NEGATIVE NEGATIVE Final    Comment:        The GeneXpert MRSA Assay (FDA approved for NASAL specimens only), is one component of a comprehensive MRSA colonization surveillance program. It is not intended to diagnose MRSA infection nor to guide or monitor treatment for MRSA infections. Performed at [facility],  [address] [place], [place] [address]      Liver Function Tests: Recent Labs  Lab 01/09/18 0239  AST 21  ALT 20  ALKPHOS 47  BILITOT 0.5  PROT 5.9*  ALBUMIN 3.3*   No results for input(s): LIPASE, AMYLASE in the last 168 hours. No results for input(s): AMMONIA in the last 168 hours.  Cardiac Enzymes: Recent Labs  Lab 01/08/18 2128 01/09/18 0239 01/09/18 0857  TROPONINI 0.04* 0.04* 0.04*   BNP (last 3 results) Recent Labs    01/08/18 1606  BNP 137.2*    ProBNP (last 3 results) No results for input(s): PROBNP in the last 8760 hours.    Studies: No results found.  Scheduled Meds: . amLODipine  10 mg Oral Daily  . carvedilol  12.5 mg Oral BID WC  . chlorthalidone  25 mg Oral Daily  . enoxaparin (LOVENOX) injection  80 mg Subcutaneous Q24H  . famotidine  20 mg Oral BID  . glipiZIDE  5 mg Oral BID AC  . insulin aspart  0-9 Units Subcutaneous Q4H  . lisinopril  40 mg Oral Daily  . metFORMIN  500 mg Oral Q breakfast  . pneumococcal 23 valent vaccine  0.5 mL Intramuscular Tomorrow-1000  . potassium chloride  20 mEq Oral BID  . rosuvastatin  30 mg Oral Daily  . sodium chloride flush  3 mL Intravenous Q12H  . spironolactone  25 mg Oral Daily      Time spent: 25 min  Meredeth Ide   [facility] Pager [phone]. If 7PM-7AM, please contact night-coverage at [email or web address], Office  [phone]  password [email or web address]  01/11/2018, 11:45 AM  LOS: 3 days

## 2018-01-11 NOTE — Progress Notes (Signed)
Patient transferred to 3E-01 via wheelchair accompanied by her son Jordan and SWAT RN Sharon.  Patient's purse and personal belongings accompanied her carried by her son. 

## 2018-01-11 NOTE — Progress Notes (Signed)
Patient called out after becoming acutely nauseated while lying in bed talking with family at bedside.  Patient vomited approximately 500ml undigested food and liquids in bathroom, partially on floor and partially in urine collection device in toilet.  Bathed and new linens provided.  [facility].  Patient denies nausea at present.  Denies chest pain or any abnormal symptoms prior to vomiting.  Will continue to monitor.

## 2018-01-11 NOTE — Progress Notes (Signed)
Dr. Sharl MaLama paged to advise of patient's SBP continuing to be >170 despite administrations of PRN IV Hydralazine as ordered.  New orders received for PRN Po Hydralazine.  See MAR for new order.

## 2018-01-11 NOTE — Plan of Care (Signed)
Concern for blood pressure. Scheduled and PRN meds given, On call provider notified, patient has no reports of any complaints.  Will continue to monitor BP closely.

## 2018-01-12 ENCOUNTER — Other Ambulatory Visit: Payer: Self-pay | Admitting: Physician Assistant

## 2018-01-12 ENCOUNTER — Encounter (HOSPITAL_COMMUNITY): Payer: Self-pay | Admitting: *Deleted

## 2018-01-12 DIAGNOSIS — I161 Hypertensive emergency: Secondary | ICD-10-CM

## 2018-01-12 LAB — GLUCOSE, CAPILLARY
Glucose-Capillary: 190 mg/dL — ABNORMAL HIGH (ref 70–99)
Glucose-Capillary: 123 mg/dL — ABNORMAL HIGH (ref 70–99)
Glucose-Capillary: 139 mg/dL — ABNORMAL HIGH (ref 70–99)
Glucose-Capillary: 130 mg/dL — ABNORMAL HIGH (ref 70–99)

## 2018-01-12 IMAGING — DX DG CHEST 2V
2 series · 2 of 2 positions shown · non-contrast
Comparison: 08/09/2013 .

CLINICAL DATA: shortness of breath

.  EXAM:
CHEST  2 VIEW

[chest pa]
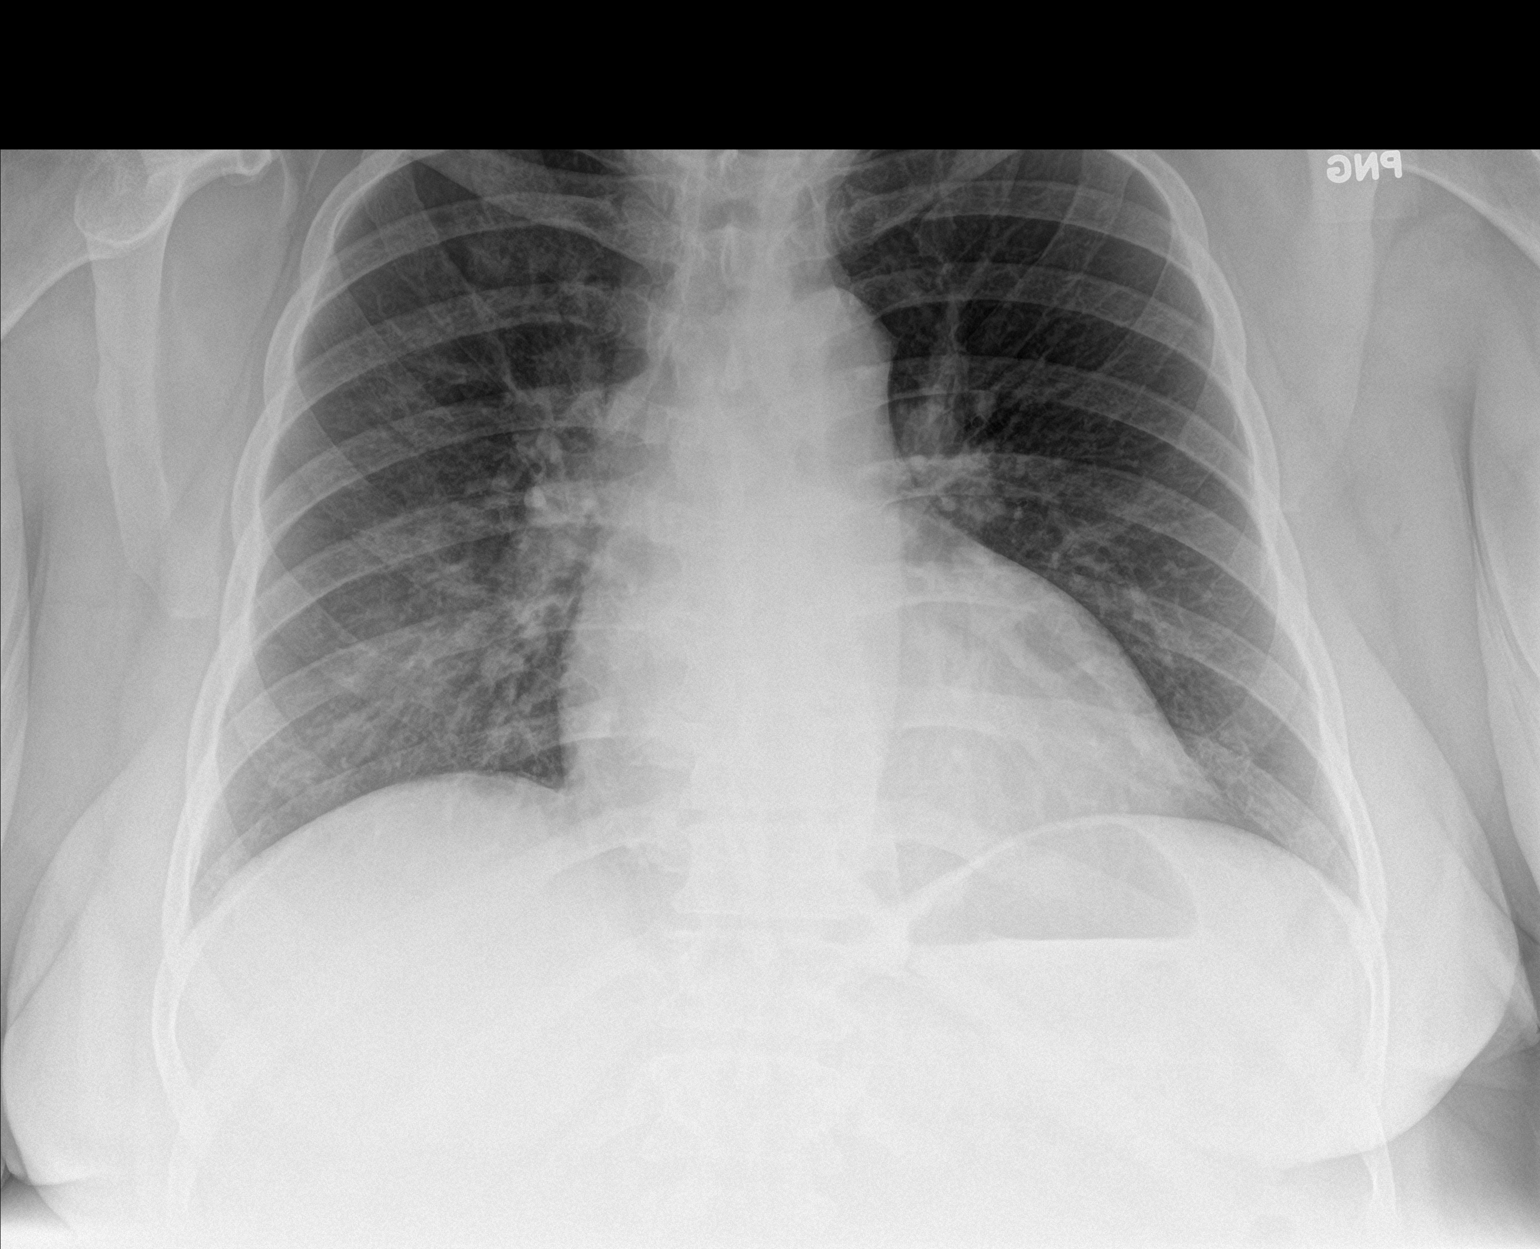

[chest lat]
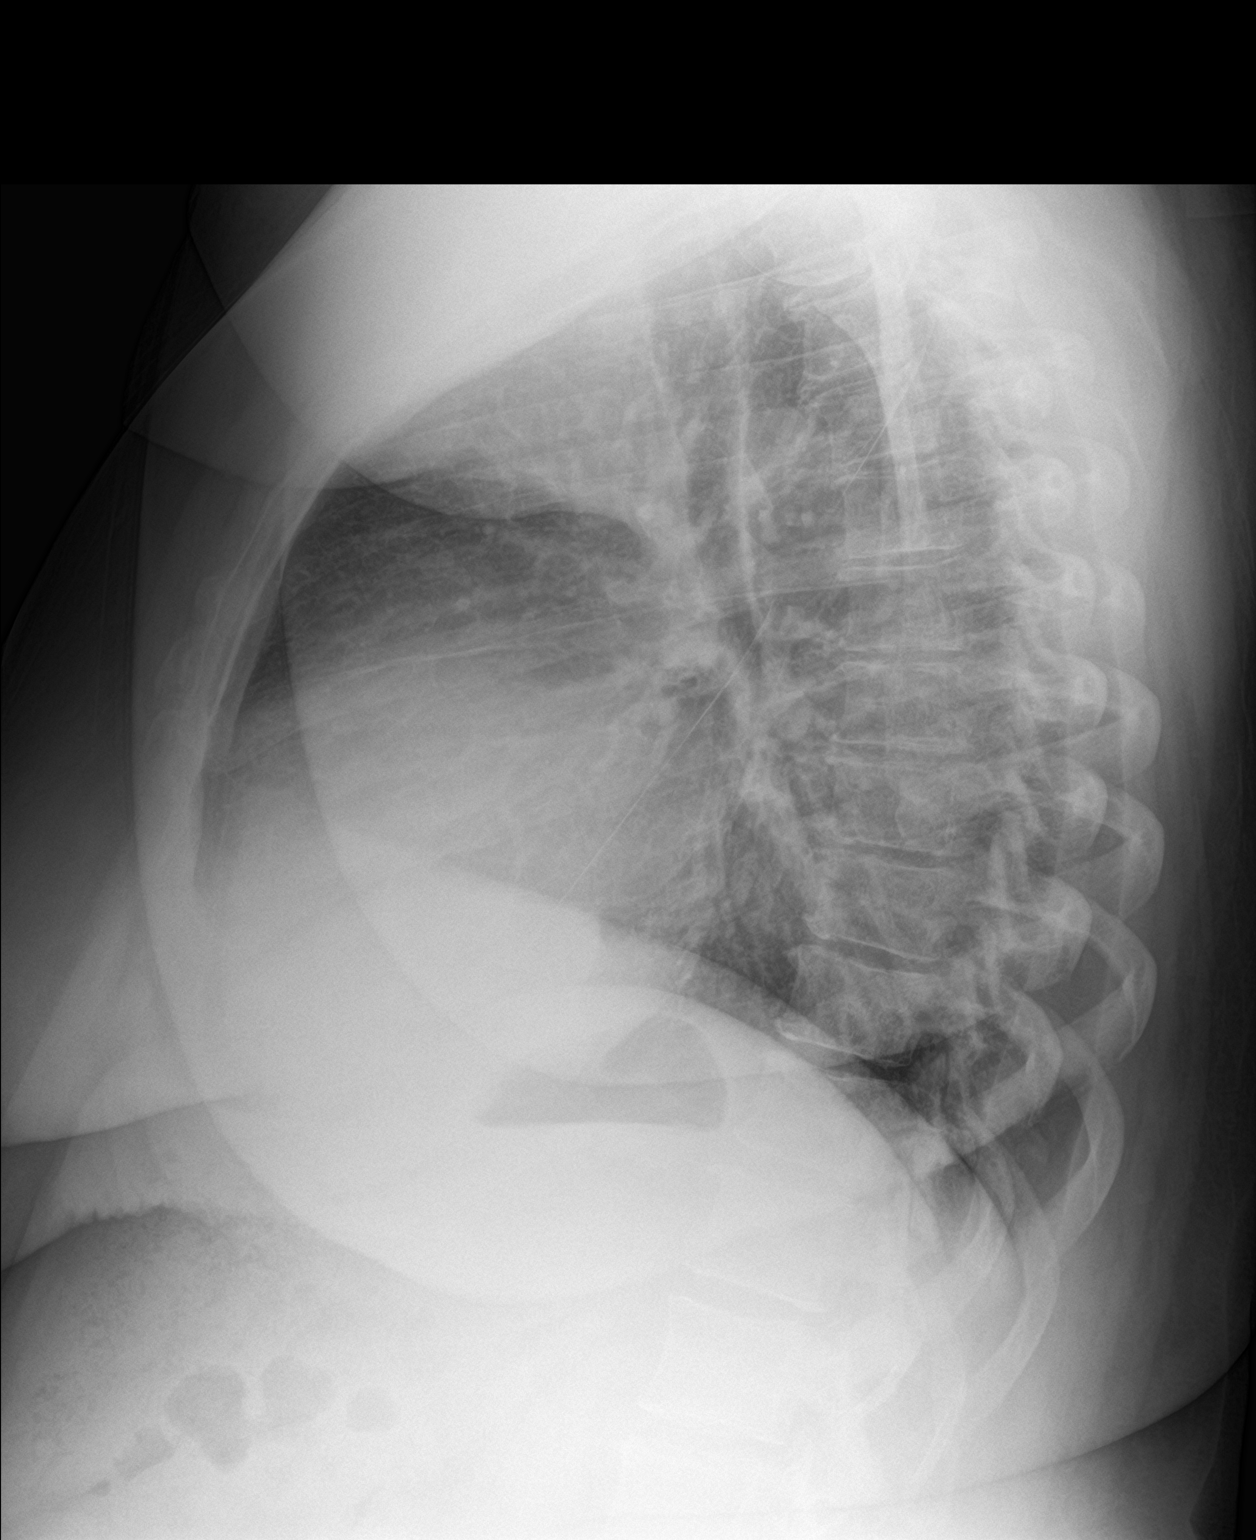

[2 of 2 positions shown; findings below may reference images not displayed]

FINDINGS: Mediastinum and hilar structures normal. Mild cardiomegaly. Mild
right lower lobe infiltrate cannot be excluded. Previously
identified left mid lung field infiltrate has cleared. No pleural
effusion pneumothorax. No acute bony abnormality.
IMPRESSION: 1. Mild right lower lobe infiltrate cannot be excluded. Previously
identified left mid lung field infiltrate is cleared.
2. Cardiomegaly.  No pulmonary venous congestion .

## 2018-01-12 MED ORDER — SPIRONOLACTONE 25 MG PO TABS: 25.0000 mg | ORAL_TABLET | Freq: Every day | ORAL | 3 refills | Status: DC

## 2018-01-12 MED ORDER — LISINOPRIL 40 MG PO TABS: 40.0000 mg | ORAL_TABLET | Freq: Every day | ORAL | 2 refills | Status: DC

## 2018-01-12 MED ORDER — POTASSIUM CHLORIDE CRYS ER 20 MEQ PO TBCR: 20.0000 meq | EXTENDED_RELEASE_TABLET | Freq: Every day | ORAL | 3 refills | Status: DC

## 2018-01-12 MED ORDER — AMLODIPINE BESYLATE 10 MG PO TABS: 10.0000 mg | ORAL_TABLET | Freq: Every day | ORAL | 3 refills | Status: DC

## 2018-01-12 MED ORDER — CARVEDILOL 12.5 MG PO TABS: 12.5000 mg | ORAL_TABLET | Freq: Two times a day (BID) | ORAL | 3 refills | Status: DC

## 2018-01-12 MED ORDER — CHLORTHALIDONE 25 MG PO TABS: 25.0000 mg | ORAL_TABLET | Freq: Every day | ORAL | 3 refills | Status: DC

## 2018-01-12 NOTE — Plan of Care (Signed)
  Problem: Education: Goal: Knowledge of General Education information will improve Description: Including pain rating scale, medication(s)/side effects and non-pharmacologic comfort measures Outcome: Progressing   Problem: Nutrition: Goal: Adequate nutrition will be maintained Outcome: Progressing   

## 2018-01-12 NOTE — Plan of Care (Signed)
  RD consulted for nutrition education regarding heart healthy diet and diabetes.   Lab Results  Component Value Date   HGBA1C 7.3 (H) 01/09/2018   PTA DM medications are 5 mg glipizide BID and 500 mg metformin daily. However, pt was not taking medications PTA   Labs reviewed: CBGS: 109-150 (inpatient orders for glycemic control are 500 mg metformin daily and 0-9 units insulin aspart TID with meals).    Case discussed with RN prior to visit, who reports that pt does not seem motivated to make lifestyle changes. Pt was not taking her medications PTA; she is able to afford her medications, however, did not think she needed to take them.   Spoke with pt at bedside, who was sleepy at time of visit (complains of having a headache). 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 During summer break, pt has made conscious efforts to eat more healthfully, including vegetables, salad, and lean proteins in her diet. When she eats fast foods, she will try to choose grilled sandwiches and a side salad.   Pt reports her largest weakness is sodas; noted pt had a bottle of regular Pepsi at bedside. Pt reports she often consumes a 12 pack of Pepsi daily. She reports she also consumes diet sprite, water, and diet Kool-aid, but is unable to "quit Pepsi- I need my caffeine- it's like crack". Discussed with pt other low calorie beverages with caffeine, however, but was dissatisfied with alternative suggestions. Focus on education was on choosing low calorie beverages and healthy options at fast food restaurants.   RD provided "Heart Healthy, Consistent Carbohydrate Nutrition Therapy" handout from the Academy of [employer]. Reviewed patient's dietary recall. Provided examples on ways to decrease sodium and fat intake in diet. Discouraged intake of processed foods and use of salt shaker. Encouraged fresh fruits and  vegetables as well as whole grain sources of carbohydrates to maximize fiber intake.   Discussed different food groups and their effects on blood sugar, emphasizing carbohydrate-containing foods. Provided list of carbohydrates and recommended serving sizes of common foods.  Discussed importance of controlled and consistent carbohydrate intake throughout the day. Provided examples of ways to balance meals/snacks and encouraged intake of high-fiber, whole grain complex carbohydrates. Teach back method used.  Expect fair to poor compliance.  Body mass index is 53.49 kg/m. Pt meets criteria for extreme obesity, class III based on current BMI.  Current diet order is Heart Healthy/ Carb Modified, patient is consuming approximately 75-100% of meals at this time. Labs and medications reviewed. No further nutrition interventions warranted at this time. RD contact information provided. If additional nutrition issues arise, please re-consult RD.  Loyce Flaming A. Mayford KnifeWilliams, RD, LDN, CDE Pager: [phone] After hours Pager: [phone]

## 2018-01-12 NOTE — Progress Notes (Signed)
Renal ultrasound

## 2018-01-12 NOTE — Discharge Summary (Signed)
Physician Discharge Summary  Jenna Stokes UEA:[id number] DOB: 04-30-1966 DOA: 01/08/2018  PCP: Terressa KoyanagiKim, Hannah R, DO  Admit date: 01/08/2018 Discharge date: 01/12/2018  Time spent: 45 minutes  Recommendations for Outpatient Follow-up:  1. Follow up PCP in 2 weeks 2. Follow up Cardiology in one week for renal artery duplex ultrasound as outpatient   Discharge Diagnoses:  Principal Problem:   Chest pain Active Problems:   Type 2 diabetes mellitus without complication, without long-term current use of insulin (HCC)   Hypertensive emergency   Lung nodule   Hypertensive urgency   Discharge Condition: Stable  Diet recommendation:  Carb modified, low salt diet  Filed Weights   01/11/18 0451 01/11/18 1840 01/12/18 0532  Weight: (!) 167.9 kg (!) 164.7 kg (!) 164.3 kg    History of present illness:  52 year old female with a history of hypertension, hyperlipidemia, diabetes mellitus, type II, morbid obesity, mild mitral regurgitation came to hospital with chest pain, shortness of breath.  CT chest showed coronary calcifications, 5.1 mm right middle lobe nodule  Hospital Course:  1. Chest pain-resolved at this time.  Cardiology has been consulted.  Troponins times three 0.04.  No further intervention per cardiology at this time.  She will follow-up with cardiology as outpatient for further work-up for ischemic heart disease. 2. Hypertensive urgency-resolved, blood pressure was significantly elevated patient was started on IV nitroglycerin which was discontinued and patient started on Cardene drip and transferred to ICU.  At this time blood pressure is stable.  PCCM was  consulted.  Home regimen for hypertension has been started.  Cardene drip has been weaned off.   Continue amlodipine, chlorthalidone, hydralazine, lisinopril, Aldactone, Coreg. Still awaiting Remal artery ultrasound, I called and discussed with cardiology, Dr Rennis GoldenHilty, who recommends that patient can be discharged and follow up  cardiology as outpatient for renal doppler ultrasound. 3. Hypokalemia-replete, today potassium is 3.8. 4. Diabetes mellitus- continue Metformin, Glucotrol. Blood glucose well controlled. 5. Lung nodule-seen on CT chest, patient will require outpatient noncontrast chest CT at 6 to 12 months  Procedures:  None   Consultations:  Cardiology   PCCM  Discharge Exam: Vitals:   01/12/18 0905 01/12/18 1141  BP: (!) 149/65 (!) 129/50  Pulse: 62 (!) 57  Resp:  18  Temp:  98.3 F (36.8 C)  SpO2: 100% 98%    General: Appears in no acute distress Cardiovascular: S1S2 RRR Respiratory: Clear bilaterally  Discharge Instructions   Discharge Instructions    Diet - low sodium heart healthy   Complete by:  As directed    Increase activity slowly   Complete by:  As directed      Allergies as of 01/12/2018      Reactions   Penicillins Other (See Comments)   Unknown childhood reaction Has patient had a PCN reaction causing immediate rash, facial/tongue/throat swelling, SOB or lightheadedness with hypotension: Unknown Has patient had a PCN reaction causing severe rash involving mucus membranes or skin necrosis: Unknown Has patient had a PCN reaction that required hospitalization: Unknown Has patient had a PCN reaction occurring within the last 10 years: No If all of the above answers are "NO", then may proceed with Cephalosporin use.      Medication List    STOP taking these medications   fluticasone 50 MCG/ACT nasal spray Commonly known as:  FLONASE   hydrALAZINE 25 MG tablet Commonly known as:  APRESOLINE     TAKE these medications   amLODipine 10 MG tablet Commonly known as:  NORVASC Take 1 tablet (10 mg total) by mouth daily.   carvedilol 12.5 MG tablet Commonly known as:  COREG Take 1 tablet (12.5 mg total) by mouth 2 (two) times daily with a meal.   chlorthalidone 25 MG tablet Commonly known as:  HYGROTON Take 1 tablet (25 mg total) by mouth daily.   glipiZIDE 5  MG tablet Commonly known as:  GLUCOTROL Take 1 tablet (5 mg total) by mouth 2 (two) times daily before a meal.   lisinopril 40 MG tablet Commonly known as:  PRINIVIL,ZESTRIL Take 1 tablet (40 mg total) by mouth daily. Start taking on:  01/13/2018 What changed:    medication strength  how much to take   metFORMIN 500 MG 24 hr tablet Commonly known as:  GLUCOPHAGE-XR Take 1 tablet (500 mg total) by mouth daily with breakfast.   potassium chloride SA 20 MEQ tablet Commonly known as:  K-DUR,KLOR-CON Take 1 tablet (20 mEq total) by mouth daily.   rosuvastatin 20 MG tablet Commonly known as:  CRESTOR Take 1.5 tablets (30 mg total) by mouth daily.   spironolactone 25 MG tablet Commonly known as:  ALDACTONE Take 1 tablet (25 mg total) by mouth daily. Start taking on:  01/13/2018      Allergies  Allergen Reactions  . Penicillins Other (See Comments)    Unknown childhood reaction Has patient had a PCN reaction causing immediate rash, facial/tongue/throat swelling, SOB or lightheadedness with hypotension: Unknown Has patient had a PCN reaction causing severe rash involving mucus membranes or skin necrosis: Unknown Has patient had a PCN reaction that required hospitalization: Unknown Has patient had a PCN reaction occurring within the last 10 years: No If all of the above answers are "NO", then may proceed with Cephalosporin use.    Follow-up Information    Terressa Koyanagi, DO.   Specialty:  Family Medicine Contact information: [address] [place] [address] [phone]        Berton Bon, NP Follow up on 01/27/2018.   Specialty:  Nurse Practitioner Why:  11:00 am for hospital follow up Contact information: [address] [place] [address] [phone]            The results of significant diagnostics from this hospitalization (including imaging, microbiology, ancillary and laboratory) are listed below for reference.    Significant  Diagnostic Studies: Dg Chest 2 View  Result Date: 01/08/2018 CLINICAL DATA:  CP, SOB since Monday. Hx of hypertension, diabetes. Nonsmoker. EXAM: CHEST - 2 VIEW COMPARISON:  Chest x-ray dated 07/11/2016. FINDINGS: Stable cardiomegaly. Lungs are clear. No pleural effusion or pneumothorax seen. Osseous structures are unremarkable. IMPRESSION: Stable cardiomegaly. No active cardiopulmonary disease. No evidence of pneumonia or pulmonary edema. Electronically Signed   By: Bary Richard M.D.   On: 01/08/2018 16:54   Ct Angio Chest Pe W/cm &/or Wo Cm  Result Date: 01/08/2018 CLINICAL DATA:  Hypertension and dyspnea since Monday. EXAM: CT ANGIOGRAPHY CHEST WITH CONTRAST TECHNIQUE: Multidetector CT imaging of the chest was performed using the standard protocol during bolus administration of intravenous contrast. Multiplanar CT image reconstructions and MIPs were obtained to evaluate the vascular anatomy. CONTRAST:  80 cc ISOVUE-370 IOPAMIDOL (ISOVUE-370) INJECTION 76% COMPARISON:  CXR 01/08/2018 FINDINGS: Cardiovascular: The study is of quality for the evaluation of pulmonary embolism. There are no filling defects in the central, lobar, segmental or subsegmental pulmonary artery branches to suggest acute pulmonary embolism. Great vessels are normal in course and caliber. Mild cardiomegaly without pericardial  effusion. Slightly ectatic thoracic aorta measuring up to 3.8 cm along the ascending portion. No dissection though study is tailored for the pulmonary arterial system. Coronary arteriosclerosis along the LAD. Mediastinum/Nodes: No discrete thyroid nodules. Unremarkable esophagus. No pathologically enlarged axillary, mediastinal or hilar lymph nodes. Lungs/Pleura: No pneumothorax. Trace bilateral pleural fluid with ground-glass opacities within the lungs possibly representing hypoventilatory change, alveolitis or pneumonitis or possibly stigmata of mild pulmonary edema. Nodular opacity in the lateral segment of right  middle lobe measuring 5.1 mm in average. Upper abdomen: Unremarkable. Musculoskeletal: No aggressive appearing focal osseous lesions. Degenerative change along the dorsal spine. Review of the MIP images confirms the above findings. IMPRESSION: 1. Coronary arteriosclerosis. 2. 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  No dissection. 3. No acute pulmonary embolus. 4. 5.1 mm right middle lobe nodule. Non-contrast chest CT at 6-12 months is recommended. If the nodule is stable at time of repeat CT, then future CT at 18-24 months (from today's scan) is considered optional for low-risk patients, but is recommended for high-risk patients. This recommendation follows the consensus statement: Guidelines for Management of Incidental Pulmonary Nodules Detected on CT Images: From the Fleischner Society 2017; Radiology 2017; 284:228-243. Aortic Atherosclerosis (ICD10-I70.0). Electronically Signed   By: Tollie Ethavid  Kwon M.D.   On: 01/08/2018 20:18    Microbiology: Recent Results (from the past 240 hour(s))  MRSA PCR Screening     Status: None   Collection Time: 01/08/18 11:54 PM  Result Value Ref Range Status   MRSA by PCR NEGATIVE NEGATIVE Final    Comment:        The GeneXpert MRSA Assay (FDA approved for NASAL specimens only), is one component of a comprehensive MRSA colonization surveillance program. It is not intended to diagnose MRSA infection nor to guide or monitor treatment for MRSA infections. Performed at [facility], [address] [place], [place] [address]      Labs: Basic Metabolic Panel: Recent Labs  Lab 01/08/18 1606 01/09/18 0239 01/09/18 0857 01/10/18 0303 01/11/18 0707  NA 140 138  --  138 135  K 3.0* 3.0*  --  3.4* 3.8  CL 100 102  --  103 101  CO2 30 28  --  26 27  GLUCOSE 118* 170*  --  89 148*  BUN 12 12  --  11 11  CREATININE 0.91 0.91  --  0.90 0.92  CALCIUM 9.6 9.4  --  9.5 9.7  MG  --   --  2.1  --   --    Liver Function Tests: Recent Labs  Lab  01/09/18 0239  AST 21  ALT 20  ALKPHOS 47  BILITOT 0.5  PROT 5.9*  ALBUMIN 3.3*   No results for input(s): LIPASE, AMYLASE in the last 168 hours. No results for input(s): AMMONIA in the last 168 hours. CBC: Recent Labs  Lab 01/08/18 1606 01/09/18 0239  WBC 11.6* 9.2  NEUTROABS 8.3*  --   HGB 12.7 11.3*  HCT 39.8 34.9*  MCV 91.7 91.4  PLT 216 172   Cardiac Enzymes: Recent Labs  Lab 01/08/18 2128 01/09/18 0239 01/09/18 0857  TROPONINI 0.04* 0.04* 0.04*   BNP: BNP (last 3 results) Recent Labs    01/08/18 1606  BNP 137.2*    ProBNP (last 3 results) No results for input(s): PROBNP in the last 8760 hours.  CBG: Recent Labs  Lab 01/11/18 1157 01/11/18 1531 01/11/18 2100 01/12/18 0726 01/12/18 1135  GLUCAP 150* 130* 109* 139* 130*  Signed:  Meredeth Ide MD.  [facility] 01/12/2018, 12:04 PM

## 2018-01-12 NOTE — Progress Notes (Signed)
D/w Dr. Sharl MaLama. BP reasonably well-controlled on current regimen. Will need further outpatient work-up of resistent hypertension, including renal dopplers. Will arrange outpatient renal dopplers and follow-up with Dr. Elease HashimotoNahser. Feel free to call with questions.  [facility] will sign off.   Medication Recommendations:  Amlodipine 10 mg daily, coreg 12.5 mg BID, chlorthalidone 25 mg daily, lisinopril 40 mg daily and aldactone 25 mg daily Other recommendations (labs, testing, etc):  None Follow up as an outpatient:  Outpatient renal dopplers and follow-up with Dr. Elease HashimotoNahser will be arranged.  Chrystie NoseKenneth C. Yeraldi Fidler, MD, [facility] FacilityFACC, FACP  [facility]  Medical Director of the [facility]  [facility]ovascular [facility] Diplomate of the American Board of Clinical Lipidology Attending Cardiologist  Direct Dial: [phone]  Fax: [phone]  Website:  [email or web address]

## 2018-01-12 NOTE — Care Management Note (Signed)
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 CM talked to patient about the importance of taking her medication and lifestyle changes; she eats out a lot and does not exercise; pt stated that she plans to cook more and start exercising with her MD approval. CM will continue to follow for progression of care.  Expected Discharge Date:  01/12/18               Expected Discharge Plan:   Home  In-House Referral:   Nutrietion    Status of Service:    In progress  Reola MosherChandler, Charleigh Correnti L, RN,MHA,BSN [phone] 01/12/2018, 10:20 AM

## 2018-01-14 LAB — ALDOSTERONE + RENIN ACTIVITY W/ RATIO
ALDO / PRA RATIO: 59.2 — AB (ref 0.0–30.0)
Aldosterone: 22.1 ng/dL (ref 0.0–30.0)
PRA LC/MS/MS: 0.373 ng/mL/hr (ref 0.167–5.380)

## 2018-01-15 ENCOUNTER — Encounter: Payer: Self-pay | Admitting: Family Medicine

## 2018-01-15 ENCOUNTER — Ambulatory Visit: Payer: BC Managed Care – PPO | Admitting: Family Medicine

## 2018-01-15 ENCOUNTER — Encounter: Payer: Self-pay | Admitting: *Deleted

## 2018-01-15 VITALS — BP 152/78 | HR 61 | Temp 99.0°F | Ht 69.0 in | Wt 357.4 lb

## 2018-01-15 DIAGNOSIS — E876 Hypokalemia: Secondary | ICD-10-CM | POA: Diagnosis not present

## 2018-01-15 DIAGNOSIS — R899 Unspecified abnormal finding in specimens from other organs, systems and tissues: Secondary | ICD-10-CM

## 2018-01-15 DIAGNOSIS — I161 Hypertensive emergency: Secondary | ICD-10-CM

## 2018-01-15 DIAGNOSIS — E119 Type 2 diabetes mellitus without complications: Secondary | ICD-10-CM

## 2018-01-15 DIAGNOSIS — E1169 Type 2 diabetes mellitus with other specified complication: Secondary | ICD-10-CM

## 2018-01-15 DIAGNOSIS — R918 Other nonspecific abnormal finding of lung field: Secondary | ICD-10-CM

## 2018-01-15 DIAGNOSIS — E785 Hyperlipidemia, unspecified: Secondary | ICD-10-CM

## 2018-01-15 DIAGNOSIS — R911 Solitary pulmonary nodule: Secondary | ICD-10-CM

## 2018-01-15 LAB — ALDOSTERONE + RENIN ACTIVITY W/ RATIO
ALDO / PRA RATIO: 5.7 (ref 0.0–30.0)
ALDOSTERONE: 4.6 ng/dL (ref 0.0–30.0)
PRA LC/MS/MS: 0.809 ng/mL/hr (ref 0.167–5.380)

## 2018-01-15 NOTE — Patient Instructions (Signed)
BEFORE YOU LEAVE: -check her cuff for her -follow up: 3-4 months  See the cardiologist as planned   Take ALL of your medications every day as prescribed  Get a CT scan of the chest in 6-12 months to check on the pulmonary nodule  Eat a healthy low sugar diet and get regular exercise - let us know if you want a referral to the [place] weight management clinic. Recommend a 10-20 lb weight reduction over the next 3-4 months.  We placed a referral for you as discussed. It usually takes about 1-2 weeks to process and schedule this referral. If you have not heard from us regarding this appointment in 2 weeks please contact our office.   We recommend the following healthy lifestyle for LIFE: 1) Small portions. But, make sure to get regular (at least 3 per day), healthy meals and small healthy snacks if needed.  2) Eat a healthy clean diet.   TRY TO EAT: -at least 5-7 servings of low sugar, colorful, and nutrient rich vegetables per day (not corn, potatoes or bananas.) -berries are the best choice if you wish to eat fruit (only eat small amounts if trying to reduce weight)  -lean meets (fish, white meat of chicken or [place]key) -vegan proteins for some meals - beans or tofu, whole grains, nuts and seeds -Replace bad fats with good fats - good fats include: fish, nuts and seeds, canola oil, olive oil -small amounts of low fat or non fat dairy -small amounts of100 % whole grains - check the lables -drink plenty of water  AVOID: -SUGAR, sweets, anything with added sugar, corn syrup or sweeteners - must read labels as even foods advertised as "healthy" often are loaded with sugar -if you must have a sweetener, small amounts of stevia may be best -sweetened beverages and artificially sweetened beverages -simple starches (rice, bread, potatoes, pasta, chips, etc - small amounts of 100% whole grains are ok) -red meat, pork, butter -fried foods, fast food, processed food, excessive dairy, eggs  and coconut.  3)Get at least 150 minutes of sweaty aerobic exercise per week.  4)Reduce stress - consider counseling, meditation and relaxation to balance other aspects of your life.

## 2018-01-15 NOTE — Progress Notes (Signed)
HPI:  Using dictation device. Unfortunately this device frequently misinterprets words/phrases.  Jenna Stokes is a pleasant 52 y.o. with a PMH significant for HTN, morbid obesity, poor compliance with medications and follow up, OSA, diabetes and hyperlipidemia here for a hospital follow up.  Per review of discharge documents and patient: Hospitalized 01/08/18 to 01/12/18 Primary admitting complaint(s): DOE, worsening dyspnea, severely elevated BP, chest discomfort Primary admitting diagnosis (es) and treatment: chest pain, hypertensive emergency - evaluated by cardiology and managed in the ICU for part of her stay for uncontrolled hypertension, had cardiac CT, troponins, cardiology consult. Medications tweaked. Had abn ren/ald labs on one check, ok on another check. Plans to see cardiologist for follow up out patient. Other significant diagnosis (es) and treatment: lung nodule on CT - radiology recommend repeat in 6-12 months Follow up concerns per discharge document: follow up with cardiology - they are planning renal art duplex; hypokal repleted; repeat CT for pulm nodule 6-12 months Reports today: feels much better. No CP or trouble breathing since discharge.  She is taking all medications except for the aldosterone which she reports she will pick up today. She has an appointment with cardiology set up. She reports in the hospital they did a "US" to check her adrenal glands because her sister has a history of PA and had an adrenal gland removed. She reports they told her that her adrenal glands we fine on the US, but she wonders about an aldosterone issue. 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  ROS: See pertinent positives and negatives per HPI.  Past Medical History:  Diagnosis Date  . 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 Hyperlipidemia   . Hypertension   . Morbid obesity (HCC)     Past Surgical History:  Procedure Laterality Date  . ABDOMINAL HYSTERECTOMY    . CESAREAN SECTION    . TONSILLECTOMY      Family History  Problem Relation Age of Onset  . Heart disease Mother   . Diabetes Mother   . Hypertension Sister   . Diabetes Sister   . Adrenal disorder Sister     SOCIAL HX: see hpi   Current Outpatient Medications:  .  amLODipine (NORVASC) 10 MG tablet, Take 1 tablet (10 mg total) by mouth daily., Disp: 30 tablet, Rfl: 3 .  carvedilol (COREG) 12.5 MG tablet, Take 1 tablet (12.5 mg total) by mouth 2 (two) times daily with a meal., Disp: 60 tablet, Rfl: 3 .  chlorthalidone (HYGROTON) 25 MG tablet, Take 1 tablet (25 mg total) by mouth daily., Disp: 30 tablet, Rfl: 3 .  glipiZIDE (GLUCOTROL) 5 MG tablet, Take 1 tablet (5 mg total) by mouth 2 (two) times daily before a meal., Disp: 120 tablet, Rfl: 3 .  lisinopril (PRINIVIL,ZESTRIL) 40 MG tablet, Take 1 tablet (40 mg total) by mouth daily., Disp: 30 tablet, Rfl: 2 .  metFORMIN (GLUCOPHAGE-XR) 500 MG 24 hr tablet, Take 1 tablet (500 mg total) by mouth daily with breakfast., Disp: 60 tablet, Rfl: 1 .  potassium chloride  SA (K-DUR,KLOR-CON) 20 MEQ tablet, Take 1 tablet (20 mEq total) by mouth daily., Disp: 30 tablet, Rfl: 3 .  spironolactone (ALDACTONE) 25 MG tablet, Take 1 tablet (25 mg total) by mouth daily., Disp: 30 tablet, Rfl: 3 .  rosuvastatin (CRESTOR) 20 MG tablet, Take 1.5 tablets (30 mg total) by mouth daily. (Patient not taking: Reported on 01/08/2018), Disp: 45 tablet, Rfl: 2  EXAM:  Vitals:   01/15/18 1302 01/15/18 1336  BP: (!) 160/88 (!) 152/78  Pulse: 61   Temp: 99 F (37.2 C)   SpO2: 98%      Body mass index is 52.78 kg/m.  GENERAL: vitals reviewed and listed above, alert, oriented, appears well hydrated and in no acute distress, morbid obesity  HEENT: atraumatic, conjunttiva clear, no obvious abnormalities on inspection of external nose and ears  NECK: no obvious masses on inspection  LUNGS: clear to auscultation bilaterally, no wheezes, rales or rhonchi, good air movement  CV: HRRR, no peripheral edema  MS: moves all extremities without noticeable abnormality  PSYCH: pleasant and cooperative, no obvious depression or anxiety  ASSESSMENT AND PLAN:  Discussed the following assessment and plan:  Hypertensive emergency - Plan: Ambulatory referral to Endocrinology  Abnormal laboratory test - Plan: Ambulatory referral to Endocrinology  Hypokalemia - Plan: Ambulatory referral to Endocrinology  Morbid obesity (HCC)  Type 2 diabetes mellitus without complication, without long-term current use of insulin (HCC)  Hyperlipidemia associated with type 2 diabetes mellitus (HCC)  Lung nodule - Plan: CT CHEST NODULE FOLLOW UP LOW DOSE W/O  Abnormal findings on diagnostic imaging of lung - Plan: CT CHEST NODULE FOLLOW UP LOW DOSE W/O  More than 50% of over 40 minutes spent in total in caring for this patient was spent face-to-face with the patient, counseling and/or coordinating care.  -reviewed discharge documents and recommendations with pt -advised healthy diet, strict medication adherence, follow up with cardiology, referral to endo regarding further eval/managment possible PA - continue spironolactone in interim -BP better despite her not taking aldosterone since discharge - she agrees to start -had a lengthy discussion about the importance of wt reduction and options (diet, exercise, counseling, medications, surgery, etc) and various programs. She wants to work on diet and has already started. Seems motivated. Advised her of options for intensive dietary counseling to  help her stay on tract or she could follow up here for this on a frequent basis. -ordered CT for f/u pulm nodule -past due for colon cancer screening - advised of options and offered to order -Patient advised to return or notify a doctor immediately if symptoms worsen or persist or new concerns arise.  Patient Instructions  BEFORE YOU LEAVE: -check her cuff for her -follow up: 3-4 months  See the cardiologist as planned   Take ALL of your medications every day as prescribed  Get a CT scan of the chest in 6-12 months to check on the pulmonary nodule  Eat a healthy low sugar diet and get regular exercise - let us know if you want a referral to the [place] weight management clinic. Recommend a 10-20 lb weight reduction over the next 3-4 months.  We placed a referral for you as discussed. It usually takes about 1-2 weeks to process and schedule this referral. If you have not heard from [place] regarding this appointment in 2 weeks please contact our office.   We recommend the following healthy lifestyle for LIFE: 1) Small portions. But, make sure to get regular (at least 3  per day), healthy meals and small healthy snacks if needed.  2) Eat a healthy clean diet.   TRY TO EAT: -at least 5-7 servings of low sugar, colorful, and nutrient rich vegetables per day (not corn, potatoes or bananas.) -berries are the best choice if you wish to eat fruit (only eat small amounts if trying to reduce weight)  -lean meets (fish, white meat of chicken or [place]) -vegan proteins for some meals - beans or tofu, whole grains, nuts and seeds -Replace bad fats with good fats - good fats include: fish, nuts and seeds, canola oil, olive oil -small amounts of low fat or non fat dairy -small amounts of100 % whole grains - check the lables -drink plenty of water  AVOID: -SUGAR, sweets, anything with added sugar, corn syrup or sweeteners - must read labels as even foods advertised as "healthy" often are loaded  with sugar -if you must have a sweetener, small amounts of stevia may be best -sweetened beverages and artificially sweetened beverages -simple starches (rice, bread, potatoes, pasta, chips, etc - small amounts of 100% whole grains are ok) -red meat, pork, butter -fried foods, fast food, processed food, excessive dairy, eggs and coconut.  3)Get at least 150 minutes of sweaty aerobic exercise per week.  4)Reduce stress - consider counseling, meditation and relaxation to balance other aspects of your life.      Jenna KoyanagiHannah R Jeret Goyer, DO

## 2018-01-19 ENCOUNTER — Telehealth: Payer: Self-pay | Admitting: *Deleted

## 2018-01-19 IMAGING — CT CT HEAD WO/W CM
1 of 2 series · 13 of 30 positions shown, 17 images · IV contrast (iopamidol)
Comparison: [facility], 80 mL.

CLINICAL DATA: High blood pressure for 1 week with headaches.

EXAM:
CT HEAD WITHOUT AND WITH CONTRAST
TECHNIQUE: Contiguous axial images were obtained from the base of the skull
through the vertex without and with intravenous contrast
CONTRAST:  80mL YTPMPS-6MM IOPAMIDOL (YTPMPS-6MM) INJECTION 61%

[Series 2: head wo 5.0 h37s · axial · 0.40mm/px · z∈[+1408,+1528]mm · 13 of 28 slices shown, 17 images]
[im 2/28  brain]
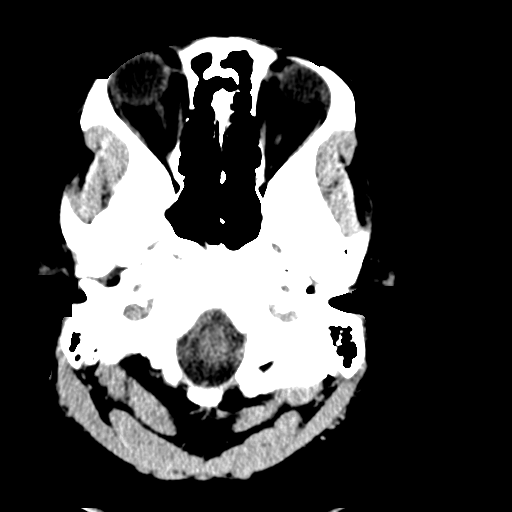
[im 2/28  bone]
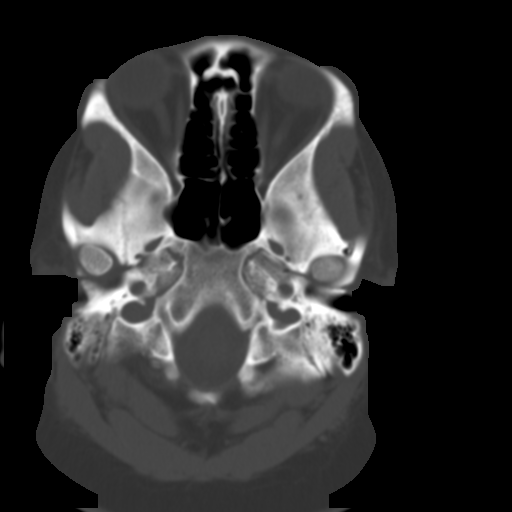
[im 4/28  brain]
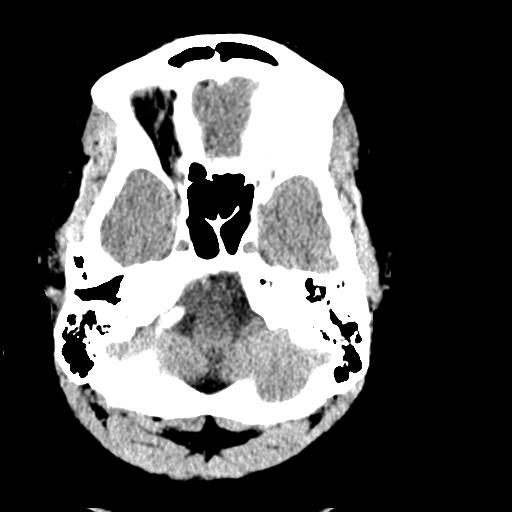
[im 6/28  brain]
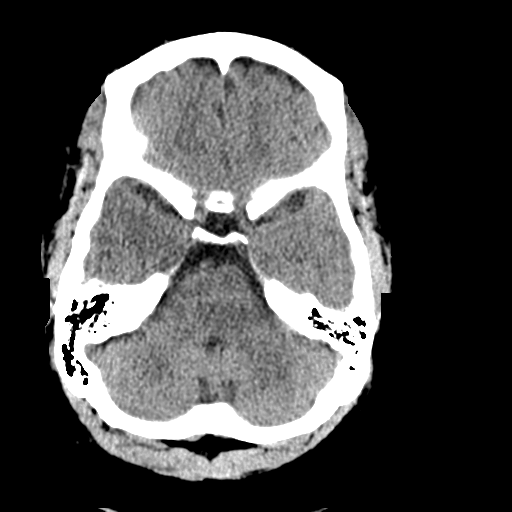
[im 8/28  brain]
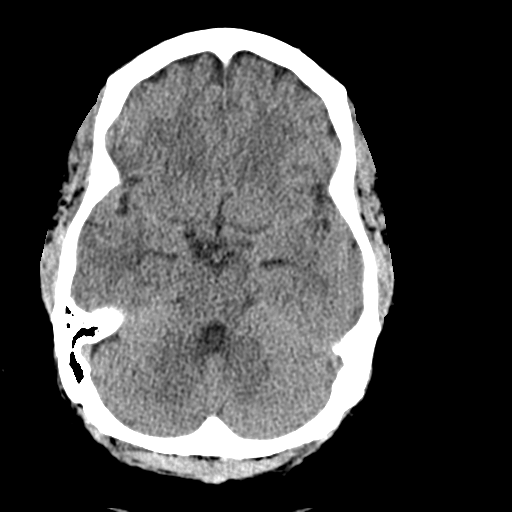
[im 10/28  brain]
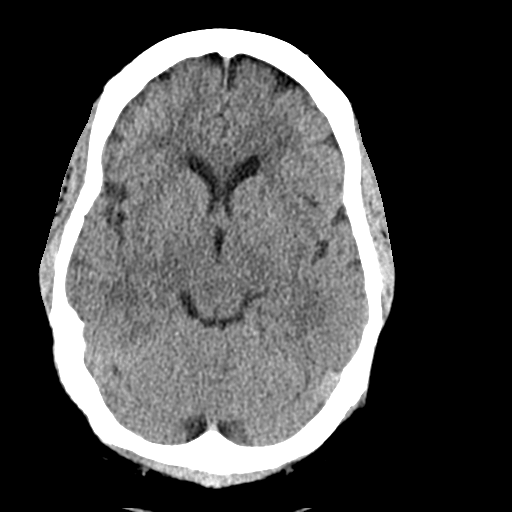
[im 10/28  bone]
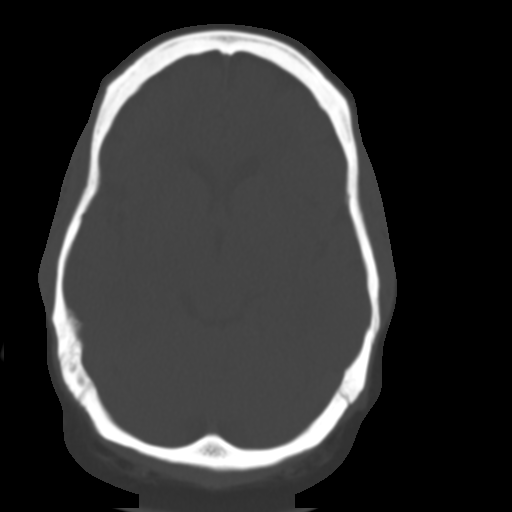
[im 12/28  brain]
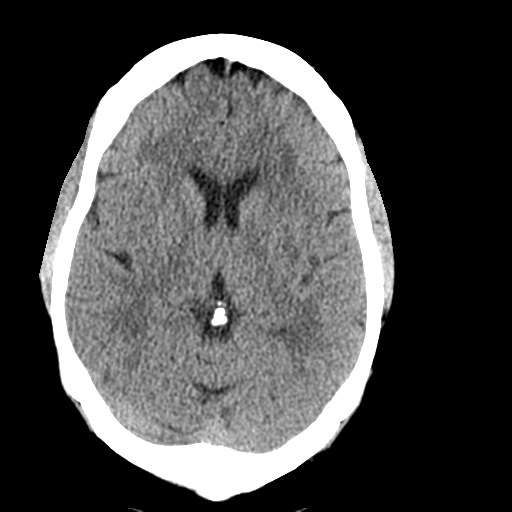
[im 14/28  brain]
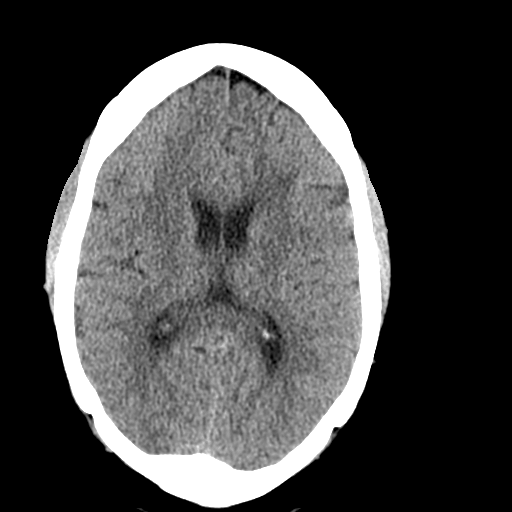
[im 16/28  brain]
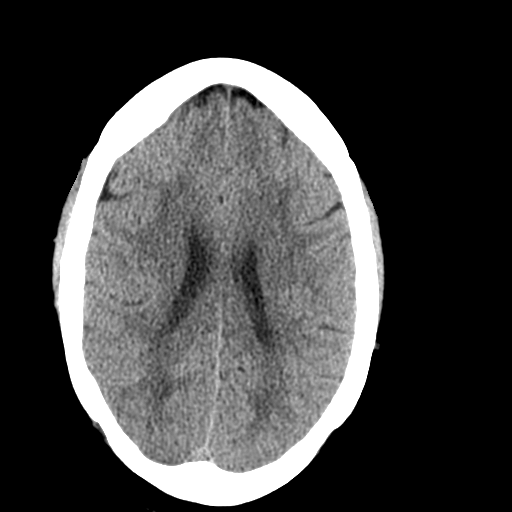
[im 18/28  brain]
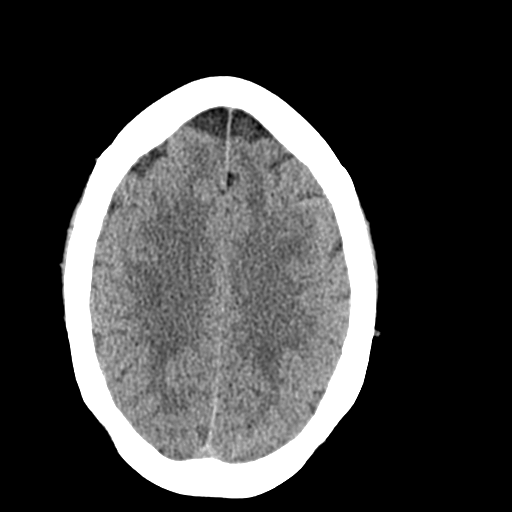
[im 18/28  bone]
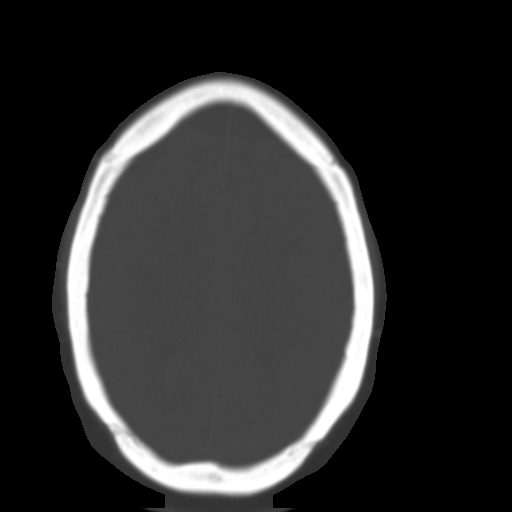
[im 20/28  brain]
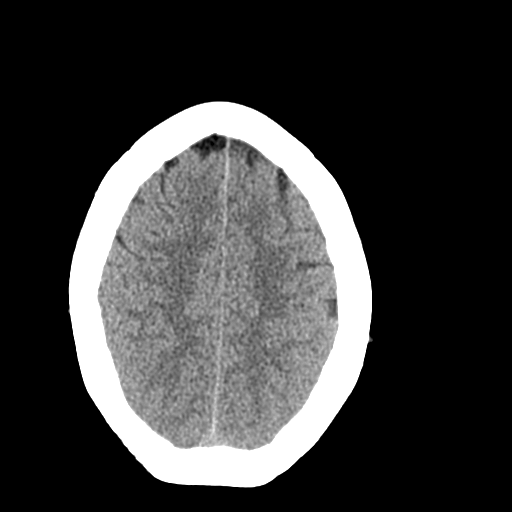
[im 22/28  brain]
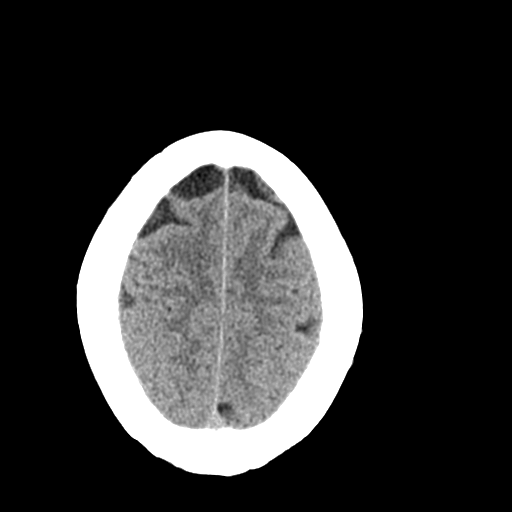
[im 24/28  brain]
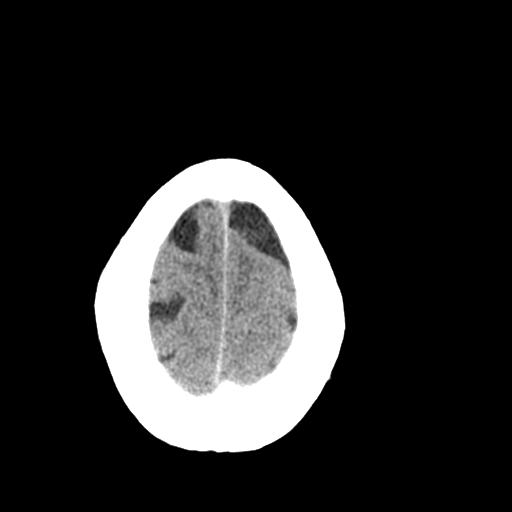
[im 26/28  brain]
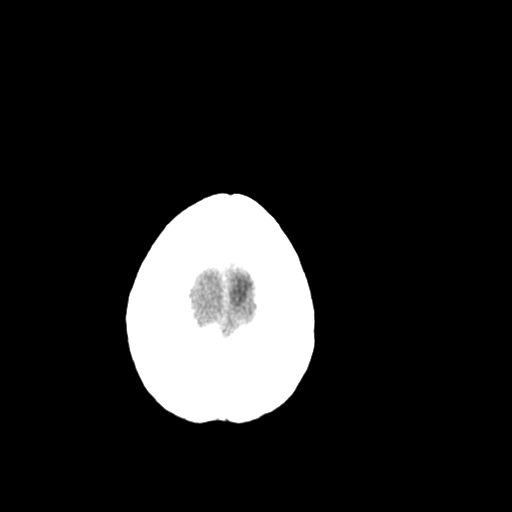
[im 26/28  bone]
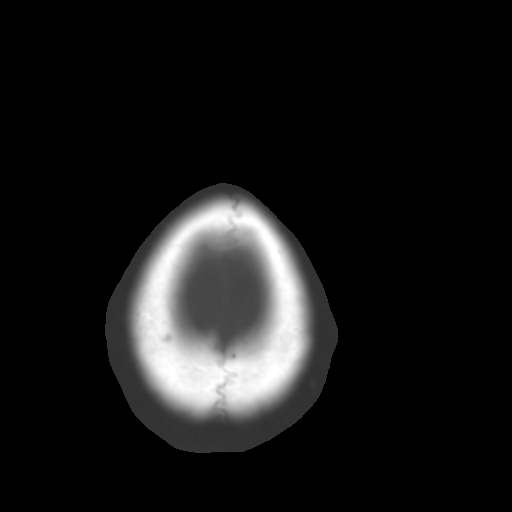

[13 of 30 positions shown; findings below may reference images not displayed]

FINDINGS: No evidence for acute infarction, hemorrhage, mass lesion,
hydrocephalus, or extra-axial fluid. Normal for age cerebral volume.
Patchy Hypoattenuation of white matter, primarily subcortical,
without visible lacunar infarction, likely early chronic
microvascular ischemic change.

Following administration of contrast, no abnormal enhancement. No
signs of large vessel occlusion. No signs of hypertensive
encephalopathy. Calvarium intact. No sinus or mastoid disease.
IMPRESSION: No acute intracranial findings. Suspected early chronic
microvascular ischemic change. This could be confirmed with MR. No
sign suggestive of hypertensive encephalopathy.

## 2018-01-19 NOTE — Telephone Encounter (Signed)
Per Dr Maudie Mercury I called the pt and asked in regards to the test for colon cancer screening if she preferred to have a colonoscopy or Cologuard.  She stated she preferred not to do either, already has the Cologuard kit at home and will get around to doing this at some time.

## 2018-01-21 ENCOUNTER — Inpatient Hospital Stay (HOSPITAL_COMMUNITY): Admission: RE | Admit: 2018-01-21 | Payer: BC Managed Care – PPO | Source: Ambulatory Visit

## 2018-01-22 ENCOUNTER — Other Ambulatory Visit: Payer: Self-pay | Admitting: *Deleted

## 2018-01-22 DIAGNOSIS — R911 Solitary pulmonary nodule: Secondary | ICD-10-CM

## 2018-01-27 ENCOUNTER — Ambulatory Visit: Payer: BC Managed Care – PPO | Admitting: Cardiology

## 2018-01-28 ENCOUNTER — Other Ambulatory Visit: Payer: Self-pay | Admitting: Family Medicine

## 2018-01-28 DIAGNOSIS — I10 Essential (primary) hypertension: Secondary | ICD-10-CM

## 2018-02-10 ENCOUNTER — Ambulatory Visit (HOSPITAL_COMMUNITY)
Admission: RE | Admit: 2018-02-10 | Discharge: 2018-02-10 | Disposition: A | Discharge status: Home / Self Care | Payer: BC Managed Care – PPO | Source: Ambulatory Visit | Attending: Cardiovascular Disease | Admitting: Cardiovascular Disease

## 2018-02-10 DIAGNOSIS — I10 Essential (primary) hypertension: Secondary | ICD-10-CM | POA: Diagnosis present

## 2018-02-24 ENCOUNTER — Ambulatory Visit: Payer: BC Managed Care – PPO | Admitting: Cardiology

## 2018-03-04 ENCOUNTER — Other Ambulatory Visit: Payer: Self-pay | Admitting: Endocrinology

## 2018-03-04 DIAGNOSIS — E269 Hyperaldosteronism, unspecified: Secondary | ICD-10-CM

## 2018-03-05 ENCOUNTER — Other Ambulatory Visit: Payer: Self-pay | Admitting: *Deleted

## 2018-03-05 MED ORDER — ROSUVASTATIN CALCIUM 20 MG PO TABS: 30.0000 mg | ORAL_TABLET | Freq: Every day | ORAL | 2 refills | Status: DC

## 2018-03-05 NOTE — Telephone Encounter (Signed)
Rx done. 

## 2018-03-10 ENCOUNTER — Ambulatory Visit
Admission: RE | Admit: 2018-03-10 | Discharge: 2018-03-10 | Disposition: A | Discharge status: Home / Self Care | Payer: BC Managed Care – PPO | Source: Ambulatory Visit | Attending: Endocrinology | Admitting: Endocrinology

## 2018-03-10 DIAGNOSIS — E269 Hyperaldosteronism, unspecified: Secondary | ICD-10-CM

## 2018-04-07 ENCOUNTER — Other Ambulatory Visit: Payer: Self-pay | Admitting: Family Medicine

## 2018-04-21 ENCOUNTER — Ambulatory Visit: Payer: BC Managed Care – PPO | Admitting: Family Medicine

## 2018-12-22 ENCOUNTER — Telehealth: Payer: Self-pay | Admitting: *Deleted

## 2018-12-22 ENCOUNTER — Encounter: Payer: Self-pay | Admitting: Family Medicine

## 2018-12-22 ENCOUNTER — Other Ambulatory Visit: Payer: Self-pay

## 2018-12-22 ENCOUNTER — Ambulatory Visit (INDEPENDENT_AMBULATORY_CARE_PROVIDER_SITE_OTHER): Payer: BC Managed Care – PPO | Admitting: Family Medicine

## 2018-12-22 DIAGNOSIS — Z20828 Contact with and (suspected) exposure to other viral communicable diseases: Secondary | ICD-10-CM

## 2018-12-22 DIAGNOSIS — J989 Respiratory disorder, unspecified: Secondary | ICD-10-CM

## 2018-12-22 DIAGNOSIS — R0981 Nasal congestion: Secondary | ICD-10-CM | POA: Diagnosis not present

## 2018-12-22 DIAGNOSIS — R509 Fever, unspecified: Secondary | ICD-10-CM | POA: Diagnosis not present

## 2018-12-22 DIAGNOSIS — Z20822 Contact with and (suspected) exposure to covid-19: Secondary | ICD-10-CM

## 2018-12-22 NOTE — Progress Notes (Signed)
Virtual Visit via Video Note  I connected with Jenna Stokes  on 12/22/18 at 12:20 PM EDT by a video enabled telemedicine application and verified that I am speaking with the correct person using two identifiers.  Location patient: home Location provider:work or home office Persons participating in the virtual visit: patient, provider  I discussed the limitations of evaluation and management by telemedicine and the availability of in person appointments. The patient expressed understanding and agreed to proceed.   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 Talmage NapBalan for her BP and diabetes - BP was 136/60 in March, she also had labs then and reports "all that was pretty good" -she has gained some weight over the last few months, she is trying to be more active -symptoms started July 15th -needs work slip  ROS: See pertinent positives and negatives per HPI.  Past Medical History:  Diagnosis Date  . 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 Hyperlipidemia   . Hypertension   . Morbid obesity (HCC)     Past Surgical History:  Procedure Laterality Date  . ABDOMINAL HYSTERECTOMY    . CESAREAN SECTION    . TONSILLECTOMY      Family History  Problem Relation Age of Onset  . Heart disease Mother   . Diabetes Mother   . Hypertension Sister   . Diabetes  Sister   . Adrenal disorder Sister     SOCIAL HX: see hpi   Current Outpatient Medications:  .  glipiZIDE (GLUCOTROL) 5 MG tablet, Take 1 tablet (5 mg total) by mouth 2 (two) times daily before a meal., Disp: 120 tablet, Rfl: 3 .  lisinopril (PRINIVIL,ZESTRIL) 40 MG tablet, Take 1 tablet (40 mg total) by mouth daily., Disp: 30 tablet, Rfl: 2 .  metFORMIN (GLUCOPHAGE-XR) 500 MG 24 hr tablet, TAKE ONE TABLET BY MOUTH DAILY WITH BREAKFAST, Disp: 90 tablet, Rfl: 1 .  spironolactone (ALDACTONE) 25 MG tablet, Take 1 tablet (25 mg total) by mouth daily., Disp: 30 tablet, Rfl: 3  EXAM:  VITALS per patient if applicable:  GENERAL: alert, oriented, appears well and in no acute distress  HEENT: atraumatic, conjunttiva clear, no obvious abnormalities on inspection of external nose and ears  NECK: normal movements of the head and neck  LUNGS: on inspection no signs of respiratory distress, breathing rate appears normal, no obvious gross SOB, gasping or wheezing  CV: no obvious cyanosis  MS: moves all visible extremities without noticeable abnormality  PSYCH/NEURO: pleasant and cooperative, no obvious depression or anxiety, speech and thought processing grossly intact  ASSESSMENT AND PLAN:  Discussed the following assessment and plan:  Sinus congestion Respiratory illness Fever, unspecified fever cause   -we discussed possible serious and likely etiologies, workup and treatment, treatment risks and return precautions. Query viral  illness or COVID19 possible. She primarily wants COVID19 testing. -after this discussion, Conley RollsKimley opted for: -orders placed of rCOVID testing -discussed home quarantine/selfisolation protocol -work slip provided in patient instructions -follow up 5-7 days -of course, we advised Darcey  to return or notify a doctor immediately if symptoms worsen or persist or new concerns arise.  .  Follow up instructions: Advised assistant Ronnald CollumJo Anne to help patient arrange  the following: -follow up with Dr. Selena BattenKim in 5-7 days  Terressa KoyanagiHannah R Kennisha Qin, DO   Patient Instructions  Follow up: in 5-7 days    Self Isolation/Home Quarantine: -see the CDC site for information:   DiscoHelp.sihttps://www.cdc.gov/coronavirus/2019-ncov/if-you-are-sick/steps-when-sick.html   -STAY HOME except for to seek medical care -stay in your own room away from others in your house and use a separate bathroom if possible -Wash hands frequently, wear a mask if you leave your room and interact as little as possible with others -seek medical care immediately if worsening - call our office for a visit or call ahead if going elsewhere to an urgent care  -seek emergency care if very sick or severe symptoms - call 911 -isolate for at least 10 days from the onset of symptoms PLUS 3 days of no fever PLUS 3 days of improving symptoms    Novel Coronavirus Testing: I sent an order for coronavirus testing. No Appointment is needed.  Testing Sites:    [place] Location:                            [address], [place]                                              [facility] Hours:                                 8a-3:45p, M-F  [facility] Location:                           [address], [place], [place] [address]                                                              [address] [facility] Hours:                                 8a-3:45p, M-F  [facility] Location:                            [facility] ([facility]ross from [facility]) Hours:                                 8a-3:45p, M-F  Positive test. These tests are not 100% perfect, but if you tested positive for COVID-19, this confirms that you have contracted the SARS-CoV-2 virus. STAY HOME to complete full  Quarantine per [employer] guidelines.  Negative test. These tests are not 100% perfect but if you tested negative for COVID-19, this indicates that you may not have  contracted the SARS-CoV-2 virus. Follow your doctor's recommendations and the CDC guidelines.     WORK SLIP:  Please excuse patient ANNMARIE PLEMMONS,  1965/07/22, from work according to the [facility] guidelines for a COVID like illness. We advise 10 days minimum from the onset of symptoms (12/16/2018) PLUS 3 days of no fever PLUS 3 days of feeling better.  She may work remotely from home in self isolation if she if feeling better and wishes to do so.  Sincerely: E-signature: Dr. Colin Benton, [place] Ph: [phone]

## 2018-12-22 NOTE — Patient Instructions (Addendum)
Follow up: in 5-7 days    Self Isolation/Home Quarantine: -see the CDC site for information:   DiscoHelp.sihttps://www.cdc.gov/coronavirus/2019-ncov/if-you-are-sick/steps-when-sick.html   -STAY HOME except for to seek medical care -stay in your own room away from others in your house and use a separate bathroom if possible -Wash hands frequently, wear a mask if you leave your room and interact as little as possible with others -seek medical care immediately if worsening - call our office for a visit or call ahead if going elsewhere to an urgent care  -seek emergency care if very sick or severe symptoms - call 911 -isolate for at least 10 days from the onset of symptoms PLUS 3 days of no fever PLUS 3 days of improving symptoms    Novel Coronavirus Testing: I sent an order for coronavirus testing. No Appointment is needed.  Testing Sites:    [place] Location:                            [address], [place]                                              [facility]) Hours:                                 8a-3:45p, M-F  [facility] Location:                           [address], [place], [place] [address]                                                              [address] [facility] Hours:                                 8a-3:45p, M-F  [facility] Location:                            [facility] ([facility]ross from [facility]) Hours:                                 8a-3:45p, M-F  Positive test. These tests are not 100% perfect, but if you tested positive for COVID-19, this confirms that you have contracted the SARS-CoV-2 virus. STAY HOME to complete full Quarantine per CDC guidelines.  Negative test. These tests are not 100% perfect but if you tested negative for COVID-19, this indicates that you may not have contracted the SARS-CoV-2 virus. Follow your doctor's recommendations and the CDC guidelines.     WORK  SLIP:  Please excuse patient Jenna Stokes,  [birth year], from work according to the [facility] guidelines for a COVID like illness. We advise 10 days minimum from the onset of symptoms (12/16/2018) PLUS 3 days of no fever PLUS 3 days of feeling better.  She may work remotely from home in self isolation if she if feeling better and wishes to do so.  Sincerely: E-signature: Dr. Colin Benton, [place] Ph: [phone]

## 2018-12-22 NOTE — Telephone Encounter (Signed)
I called the pt and she stated she will call back for the follow up visit.  Per pts request, 2 copies of the work note was mailed to the pts home address.

## 2018-12-22 NOTE — Telephone Encounter (Signed)
-----   Message from Lucretia Kern, DO sent at 12/22/2018 12:48 PM EDT ----- -followup with Dr. Maudie Mercury in 5-7 days-work slip is in pat instructions

## 2018-12-23 ENCOUNTER — Ambulatory Visit: Payer: BC Managed Care – PPO | Admitting: Family Medicine

## 2018-12-23 NOTE — Progress Notes (Deleted)
Tawana ScaleZach Regis Hinton D.O. [facility] [address] [place], [place] [address] Phone: [phone] Subjective:    I'm seeing this patient by the request  of:  Terressa KoyanagiKim, Hannah R, DO   CC: Bilateral knee pain  BJY:NWGNFAOZHYHPI:Subjective  Jenna Stokes is a 53 y.o. female coming in with complaint of ***  Onset-  Location Duration-  Character- Aggravating factors- Reliving factors-  Therapies tried-  Severity-     Past Medical History:  Diagnosis Date  . 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 Hyperlipidemia   . Hypertension   . Morbid obesity (HCC)    Past Surgical History:  Procedure Laterality Date  . ABDOMINAL HYSTERECTOMY    . CESAREAN SECTION    . TONSILLECTOMY     Social History   Socioeconomic History  . Marital status: Single    Spouse name: Not on file  . Number of children: Not on file  . Years of education: Not on file  . Highest education level: Not on file  Occupational History  . Not on file  Social Needs  . Financial resource strain: Not on file  . Food insecurity    Worry: Never true    Inability: Never true  . Transportation needs    Medical: No    Non-medical: No  Tobacco Use  . Smoking status: Never Smoker  . Smokeless tobacco: Never Used  Substance and Sexual Activity  . Alcohol use: No  . Drug use: No  . Sexual activity: Not on file  Lifestyle  . Physical activity    Days per week: 0 days    Minutes per session: 0 min  . Stress: Only a little  Relationships  . Social Musicianconnections    Talks on phone: Not on file    Gets together: Not on file    Attends religious service: Not on file    Active member of club or organization: Not on file    Attends meetings of clubs or organizations: Not on file    Relationship status: Not on file  Other Topics Concern  . 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 Penicillins Other (See Comments)    Unknown childhood reaction Has patient had a PCN reaction causing immediate rash, facial/tongue/throat swelling, SOB or lightheadedness with hypotension: Unknown Has patient had a PCN reaction causing severe rash involving mucus membranes or skin necrosis: Unknown Has patient had a PCN reaction that required hospitalization: Unknown Has patient had a PCN reaction occurring within the last 10 years: No If all of the above answers are "NO", then may proceed with Cephalosporin use.    Family History  Problem Relation Age of Onset  . Heart disease Mother   . Diabetes Mother   . Hypertension Sister   . Diabetes Sister   . Adrenal disorder Sister     Current Outpatient Medications (Endocrine & Metabolic):  .  glipiZIDE (GLUCOTROL) 5 MG tablet, Take 1 tablet (5 mg total) by mouth 2 (two) times daily before a meal. .  metFORMIN (GLUCOPHAGE-XR) 500 MG 24 hr tablet, TAKE ONE TABLET BY MOUTH DAILY WITH BREAKFAST  Current Outpatient Medications (Cardiovascular):  .  lisinopril (PRINIVIL,ZESTRIL) 40 MG tablet, Take 1 tablet (40 mg total) by mouth daily. Marland Kitchen  spironolactone (ALDACTONE) 25 MG tablet, Take 1 tablet (25 mg total) by mouth daily.        Past medical history, social, surgical and family history all reviewed in electronic medical record.  No pertanent information unless stated regarding to the chief complaint.   Review of Systems:  No headache, visual changes, nausea, vomiting, diarrhea, constipation, dizziness, abdominal pain, skin rash, fevers, chills, night sweats, weight loss, swollen lymph nodes, body aches, joint swelling, muscle aches, chest pain, shortness of breath, mood changes.   Objective  Last menstrual period 08/10/2006. Systems examined below as of    General: No apparent distress alert and oriented x3 mood and affect normal, dressed appropriately.   HEENT: Pupils equal, extraocular movements intact  Respiratory: Patient's speak in full sentences and does not appear short of breath  Cardiovascular: No lower extremity edema, non tender, no erythema  Skin: Warm dry intact with no signs of infection or rash on extremities or on axial skeleton.  Abdomen: Soft nontender  Neuro: Cranial nerves II through XII are intact, neurovascularly intact in all extremities with 2+ DTRs and 2+ pulses.  Lymph: No lymphadenopathy of posterior or anterior cervical chain or axillae bilaterally.  Gait normal with good balance and coordination.  MSK:  Non tender with full range of motion and good stability and symmetric strength and tone of shoulders, elbows, wrist, hip, knee and ankles bilaterally.     Impression and Recommendations:     This case required medical decision making of moderate complexity. The above documentation has been reviewed and is accurate and complete Lyndal Pulley, DO       Note: This dictation was prepared with Dragon dictation along with smaller phrase technology. Any transcriptional errors that result from this process are unintentional.

## 2018-12-25 LAB — NOVEL CORONAVIRUS, NAA: SARS-CoV-2, NAA: NOT DETECTED

## 2019-03-11 ENCOUNTER — Ambulatory Visit (INDEPENDENT_AMBULATORY_CARE_PROVIDER_SITE_OTHER): Payer: BC Managed Care – PPO

## 2019-03-11 ENCOUNTER — Ambulatory Visit: Payer: BC Managed Care – PPO

## 2019-03-11 ENCOUNTER — Encounter: Payer: Self-pay | Admitting: Family Medicine

## 2019-03-11 ENCOUNTER — Other Ambulatory Visit: Payer: Self-pay

## 2019-03-11 DIAGNOSIS — Z23 Encounter for immunization: Secondary | ICD-10-CM

## 2019-05-17 ENCOUNTER — Ambulatory Visit: Payer: Self-pay

## 2019-05-17 NOTE — Telephone Encounter (Signed)
  Patient called stating that she has had bouts of SOB for at least 1 week.She states that she wheezes  She states it will wake her at night. She denies cough fever.  She states that she works 2 jobs.  She denies any exposure to COVID-19. She sometimes will break into sweats.  She states when she is SOB she feels her heart pound. Care advice read to patient.  She verbalized understanding.  Call placed to office and they would like her to go to UC or ER for her symptoms. Patient refuses stating that her insurance pays for them and she wants an appointment.  Call again transferred to office. FC Kenisha. While transferring call to office I was unable to get the patient on the line. Office will call patient. Reason for Disposition . [1] MILD difficulty breathing (e.g., minimal/no SOB at rest, SOB with walking, pulse <100) AND [2] NEW-onset or WORSE than normal  Answer Assessment - Initial Assessment Questions 1. RESPIRATORY STATUS: "Describe your breathing?" (e.g., wheezing, shortness of breath, unable to speak, severe coughing)      wheezes 2. ONSET: "When did this breathing problem begin?"    1 week ago 3. PATTERN "Does the difficult breathing come and go, or has it been constant since it started?"     Comes and goes 4. SEVERITY: "How bad is your breathing?" (e.g., mild, moderate, severe)    - MILD: No SOB at rest, mild SOB with walking, speaks normally in sentences, can lay down, no retractions, pulse < 100.    - MODERATE: SOB at rest, SOB with minimal exertion and prefers to sit, cannot lie down flat, speaks in phrases, mild retractions, audible wheezing, pulse 100-120.    - SEVERE: Very SOB at rest, speaks in single words, struggling to breathe, sitting hunched forward, retractions, pulse > 120     moderate 5. RECURRENT SYMPTOM: "Have you had difficulty breathing before?" If so, ask: "When was the last time?" and "What happened that time?"     no 6. CARDIAC HISTORY: "Do you have any history of  heart disease?" (e.g., heart attack, angina, bypass surgery, angioplasty)      HTN  7. LUNG HISTORY: "Do you have any history of lung disease?"  (e.g., pulmonary embolus, asthma, emphysema)    no 8. CAUSE: "What do you think is causing the breathing problem?"     unsure 9. OTHER SYMPTOMS: "Do you have any other symptoms? (e.g., dizziness, runny nose, cough, chest pain, fever)    nothing 10. PREGNANCY: "Is there any chance you are pregnant?" "When was your last menstrual period?"       N/A 11. TRAVEL: "Have you traveled out of the country in the last month?" (e.g., travel history, exposures)      N/A  Protocols used: BREATHING DIFFICULTY-A-AH

## 2019-05-17 NOTE — Telephone Encounter (Signed)
Tried to call pt again no answer.

## 2019-05-17 NOTE — Telephone Encounter (Signed)
Called pt and she placed me on hold. I hung up will call again shortly.

## 2019-05-17 NOTE — Telephone Encounter (Signed)
Clinic RN spoke with patient. Advised patient that she will need to go to ED or UC d/t her sx. Patient verbalized understanding.

## 2019-05-18 ENCOUNTER — Other Ambulatory Visit: Payer: Self-pay

## 2019-05-18 ENCOUNTER — Encounter: Payer: Self-pay | Admitting: Family Medicine

## 2019-05-18 ENCOUNTER — Telehealth (INDEPENDENT_AMBULATORY_CARE_PROVIDER_SITE_OTHER): Payer: BC Managed Care – PPO | Admitting: Family Medicine

## 2019-05-18 DIAGNOSIS — E119 Type 2 diabetes mellitus without complications: Secondary | ICD-10-CM | POA: Diagnosis not present

## 2019-05-18 DIAGNOSIS — E1159 Type 2 diabetes mellitus with other circulatory complications: Secondary | ICD-10-CM

## 2019-05-18 DIAGNOSIS — R0600 Dyspnea, unspecified: Secondary | ICD-10-CM

## 2019-05-18 DIAGNOSIS — I152 Hypertension secondary to endocrine disorders: Secondary | ICD-10-CM

## 2019-05-18 DIAGNOSIS — G4733 Obstructive sleep apnea (adult) (pediatric): Secondary | ICD-10-CM

## 2019-05-18 DIAGNOSIS — I10 Essential (primary) hypertension: Secondary | ICD-10-CM

## 2019-05-18 DIAGNOSIS — R911 Solitary pulmonary nodule: Secondary | ICD-10-CM

## 2019-05-18 MED ORDER — ALBUTEROL SULFATE HFA 108 (90 BASE) MCG/ACT IN AERS: 2.0000 | INHALATION_SPRAY | Freq: Four times a day (QID) | RESPIRATORY_TRACT | 0 refills | Status: DC | PRN

## 2019-05-18 NOTE — Telephone Encounter (Signed)
Agree with recs to go to urgent care or ER. Please check on patient. Recommend virtual appt to establish new PCP when feeling better. Thank you.

## 2019-05-18 NOTE — Telephone Encounter (Signed)
Left a message for the pt to return my call.  CRM also created.  

## 2019-05-18 NOTE — Telephone Encounter (Signed)
Patient called back and was informed of the message below.  Stated she did not go to an urgent care or ER since we could not see her.  I advised the pt unfortunately at this time we are not seeing patients in the office with shortness of breath.  States she used an inhaler that was 53 years old which helped her and feels this is due to menopause.  States she has had shortness of breath for the past 2 months and I offered an appt for a virtual visit with Dr Maudie Mercury for today.  Patient scheduled appt for today at 5:40, declined appt for new patient visit and stated she will call for an appt with a different office to establish with a new PCP.

## 2019-05-18 NOTE — Progress Notes (Signed)
Virtual Visit via Video Note  I connected with Jenna Stokes  on 05/18/19 at  5:40 PM EST by a video enabled telemedicine application and verified that I am speaking with the correct person using two identifiers.  Location patient: home Location provider:work or home office Persons participating in the virtual visit: patient, provider  I discussed the limitations of evaluation and management by telemedicine and the availability of in person appointments. The patient expressed understanding and agreed to proceed.   HPI:  Acute visit for SOB: -started about 3-4 weeks ago -mainly happens when she over exerts herself, reports she had used albuteorl in the past, she found her albuterol a few days ago and since using it she feels much better -occasionally will get symptoms at night too -she thinks is perhaps triggered by exposure by sanitizing spray she has to use at work the last few weeks -denies fever, cough, nasal, sore throat, loss of taste or smell, chest pain, palpitations, swelling, HA, dizziness, GI symptoms -She has BP cuff at home and her BP has been 140/68-73 which is good for her -BP today was 140/72, temperature has been normal -no known COVID19 exposures, wears masks, screened at work daily and has been working -PMH significant for morbid obesity, HTN, diabetes, hyperlipidemia, pulm nodules -she reports sleep apnea testing was negative  -was seeing pulm and reports follow up CT for pulm nodules was neg, but per review of chart looks like late for 12 month follow up CT -history of very poor compliance with recommendations, care advise and medications in the past -sees cardiologist -reports has gained about 20lbs this year with the pandemic  ROS: See pertinent positives and negatives per HPI.  Past Medical History:  Diagnosis Date  . 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 Hyperlipidemia   . Hypertension   . Morbid obesity (HCC)     Past Surgical  History:  Procedure Laterality Date  . ABDOMINAL HYSTERECTOMY    . CESAREAN SECTION    . TONSILLECTOMY      Family History  Problem Relation Age of Onset  . Heart disease Mother   . Diabetes Mother   . Hypertension Sister   . Diabetes Sister   . Adrenal disorder Sister     SOCIAL HX: see hpi   Current Outpatient Medications:  .  lisinopril (PRINIVIL,ZESTRIL) 40 MG tablet, Take 1 tablet (40 mg total) by mouth daily., Disp: 30 tablet, Rfl: 2 .  metFORMIN (GLUCOPHAGE-XR) 500 MG 24 hr tablet, TAKE ONE TABLET BY MOUTH DAILY WITH BREAKFAST (Patient taking differently: 2 (two) times daily. ), Disp: 90 tablet, Rfl: 1 .  spironolactone (ALDACTONE) 25 MG tablet, Take 1 tablet (25 mg total) by mouth daily. (Patient taking differently: Take 25 mg by mouth 2 (two) times daily. ), Disp: 30 tablet, Rfl: 3 .  albuterol (VENTOLIN HFA) 108 (90 Base) MCG/ACT inhaler, Inhale 2 puffs into the lungs every 6 (six) hours as needed for shortness of breath., Disp: 8.5 g, Rfl: 0  EXAM:  VITALS per patient if applicable:BP today was 140/72, temperature has been normal  GENERAL: alert, oriented, appears well and in no acute distress  HEENT: atraumatic, conjunttiva clear, no obvious abnormalities on inspection of external nose and ears  NECK: normal movements of the head and neck  LUNGS: on inspection no signs of respiratory distress, breathing rate appears normal, no obvious gross SOB, gasping or wheezing  CV: no obvious cyanosis  MS: moves all visible extremities without noticeable abnormality  PSYCH/NEURO: pleasant and cooperative, no obvious depression or anxiety, speech and thought processing grossly intact  ASSESSMENT AND PLAN:  Discussed the following assessment and plan:  Dyspnea, unspecified type  Morbid obesity ([place])  Hypertension associated with diabetes ([place])  Type 2 diabetes mellitus without complication, without long-term current use of insulin (HCC)  Lung nodule  Obstructive  sleep apnea  -we discussed possible serious and likely etiologies, options for evaluation and workup, limitations of telemedicine visit vs in person visit, treatment, treatment risks and precautions. Pt prefers to treat via telemedicine empirically rather then risking or undertaking an in person visit at this moment. However, I advised she does need in person evaluation for this and needs a new PCP as I no longer do primary care. She has been informed of this by letter and via prior telemedicine visit. She will likely need labs, CT to follow up on the pulm nodule which was advised prior and cardiology evaluation with her cardiologist. While this may be mild bronchitis, related to her wt gain or undiagnosed asthma, she needs cardiology eval given her history and some exertional symptoms. She refuses ER/UCC. She agrees to contact her cardiologist first thing in the morning. Discussed possibility of COVID19, but testing would likely be negative at this point given has been 3-4 weeks and she would be out of isolation at this point. She was provided with testing options. She requests a refill on her albuterol as reports it is expired. Refilled. She agrees to New Patient visit with a [place] PCP as will need labs and her repeat CT and follow up/further evaluation if symptoms persist and cardiology exam not revealing. Advised UCC or ER if any worsening, severe symptoms, CP, dizziness, etc. Patient agrees to seek prompt in person care if worsening, new symptoms arise, or if is not improving with treatment.   I discussed the assessment and treatment plan with the patient. The patient was provided an opportunity to ask questions and all were answered. The patient agreed with the plan and demonstrated an understanding of the instructions.   The patient was advised to call back or seek an in-person evaluation if the symptoms worsen or if the condition fails to improve as anticipated.   Lucretia Kern, DO

## 2019-05-18 NOTE — Patient Instructions (Signed)
-  Call your cardiologist for an appointment regarding you symptoms  -I sent the refill you requested to your pharmacy  -I sent a message to our schedulers to assist you with an appointment to establish with a new Primary Care physician so that you can get your labs and your CT scan  I hope you are feeling better soon! Seek care promptly if your symptoms worsen, new concerns arise or you are not improving with treatment.

## 2019-05-19 NOTE — Progress Notes (Signed)
Patient scheduled on 06/09/2019 at 11:30 AM

## 2019-06-09 ENCOUNTER — Other Ambulatory Visit: Payer: Self-pay

## 2019-06-09 ENCOUNTER — Encounter: Payer: Self-pay | Admitting: Family Medicine

## 2019-06-09 ENCOUNTER — Ambulatory Visit (INDEPENDENT_AMBULATORY_CARE_PROVIDER_SITE_OTHER): Payer: BC Managed Care – PPO | Admitting: Family Medicine

## 2019-06-09 VITALS — BP 160/98 | HR 80 | Temp 97.8°F | Ht 69.0 in | Wt 368.7 lb

## 2019-06-09 DIAGNOSIS — E119 Type 2 diabetes mellitus without complications: Secondary | ICD-10-CM

## 2019-06-09 DIAGNOSIS — I152 Hypertension secondary to endocrine disorders: Secondary | ICD-10-CM

## 2019-06-09 DIAGNOSIS — E785 Hyperlipidemia, unspecified: Secondary | ICD-10-CM

## 2019-06-09 DIAGNOSIS — Z6841 Body Mass Index (BMI) 40.0 and over, adult: Secondary | ICD-10-CM

## 2019-06-09 DIAGNOSIS — E1169 Type 2 diabetes mellitus with other specified complication: Secondary | ICD-10-CM | POA: Diagnosis not present

## 2019-06-09 DIAGNOSIS — I10 Essential (primary) hypertension: Secondary | ICD-10-CM | POA: Diagnosis not present

## 2019-06-09 DIAGNOSIS — R911 Solitary pulmonary nodule: Secondary | ICD-10-CM | POA: Diagnosis not present

## 2019-06-09 DIAGNOSIS — E1159 Type 2 diabetes mellitus with other circulatory complications: Secondary | ICD-10-CM

## 2019-06-09 DIAGNOSIS — R319 Hematuria, unspecified: Secondary | ICD-10-CM | POA: Diagnosis not present

## 2019-06-09 DIAGNOSIS — N939 Abnormal uterine and vaginal bleeding, unspecified: Secondary | ICD-10-CM | POA: Diagnosis not present

## 2019-06-09 LAB — MICROALBUMIN / CREATININE URINE RATIO
Creatinine,U: 109.8 mg/dL
Microalb Creat Ratio: 51.3 mg/g — ABNORMAL HIGH (ref 0.0–30.0)
Microalb, Ur: 56.4 mg/dL — ABNORMAL HIGH (ref 0.0–1.9)

## 2019-06-09 LAB — URINALYSIS, ROUTINE W REFLEX MICROSCOPIC
Bilirubin Urine: NEGATIVE
Ketones, ur: NEGATIVE
Nitrite: NEGATIVE
Specific Gravity, Urine: >=1.03 — AB (ref 1.000–1.030)
Total Protein, Urine: 100 — AB
Urine Glucose: NEGATIVE
Urobilinogen, UA: 0.2 (ref 0.0–1.0)
pH: 5.5 (ref 5.0–8.0)

## 2019-06-09 LAB — CBC WITH DIFFERENTIAL/PLATELET
Basophils Absolute: 0 10*3/uL (ref 0.0–0.1)
Basophils Relative: 0.5 % (ref 0.0–3.0)
Eosinophils Absolute: 0.3 10*3/uL (ref 0.0–0.7)
Eosinophils Relative: 3.7 % (ref 0.0–5.0)
HCT: 39.6 % (ref 36.0–46.0)
Hemoglobin: 13.3 g/dL (ref 12.0–15.0)
Lymphocytes Relative: 21 % (ref 12.0–46.0)
Lymphs Abs: 1.9 10*3/uL (ref 0.7–4.0)
MCHC: 33.7 g/dL (ref 30.0–36.0)
MCV: 89 fl (ref 78.0–100.0)
Monocytes Absolute: 0.6 10*3/uL (ref 0.1–1.0)
Monocytes Relative: 6.9 % (ref 3.0–12.0)
Neutro Abs: 6.2 10*3/uL (ref 1.4–7.7)
Neutrophils Relative %: 67.9 % (ref 43.0–77.0)
Platelets: 204 10*3/uL (ref 150.0–400.0)
RBC: 4.45 Mil/uL (ref 3.87–5.11)
RDW: 13.2 % (ref 11.5–15.5)
WBC: 9.2 10*3/uL (ref 4.0–10.5)

## 2019-06-09 LAB — LIPID PANEL
Cholesterol: 219 mg/dL — ABNORMAL HIGH (ref 0–200)
HDL: 41.7 mg/dL (ref >39.00)
NonHDL: 177.68
Total CHOL/HDL Ratio: 5
Triglycerides: 300 mg/dL — ABNORMAL HIGH (ref 0.0–149.0)
VLDL: 60 mg/dL — ABNORMAL HIGH (ref 0.0–40.0)

## 2019-06-09 LAB — TSH: TSH: 1.24 u[IU]/mL (ref 0.35–4.50)

## 2019-06-09 LAB — HEMOGLOBIN A1C: Hgb A1c MFr Bld: 9.2 % — ABNORMAL HIGH (ref 4.6–6.5)

## 2019-06-09 LAB — COMPREHENSIVE METABOLIC PANEL
ALT: 19 U/L (ref 0–35)
AST: 17 U/L (ref 0–37)
Albumin: 4.4 g/dL (ref 3.5–5.2)
Alkaline Phosphatase: 60 U/L (ref 39–117)
BUN: 21 mg/dL (ref 6–23)
CO2: 26 mEq/L (ref 19–32)
Calcium: 10.7 mg/dL — ABNORMAL HIGH (ref 8.4–10.5)
Chloride: 105 mEq/L (ref 96–112)
Creatinine, Ser: 1.08 mg/dL (ref 0.40–1.20)
GFR: 64.14 mL/min (ref >60.00)
Glucose, Bld: 153 mg/dL — ABNORMAL HIGH (ref 70–99)
Potassium: 4.6 mEq/L (ref 3.5–5.1)
Sodium: 139 mEq/L (ref 135–145)
Total Bilirubin: 0.3 mg/dL (ref 0.2–1.2)
Total Protein: 6.9 g/dL (ref 6.0–8.3)

## 2019-06-09 LAB — LDL CHOLESTEROL, DIRECT: Direct LDL: 121 mg/dL

## 2019-06-09 MED ORDER — ALBUTEROL SULFATE HFA 108 (90 BASE) MCG/ACT IN AERS: 2.0000 | INHALATION_SPRAY | Freq: Four times a day (QID) | RESPIRATORY_TRACT | 1 refills | Status: DC | PRN

## 2019-06-09 MED ORDER — METFORMIN HCL ER 500 MG PO TB24: 500.0000 mg | ORAL_TABLET | Freq: Every day | ORAL | 1 refills | Status: DC

## 2019-06-09 MED ORDER — SPIRONOLACTONE 50 MG PO TABS: 50.0000 mg | ORAL_TABLET | Freq: Every day | ORAL | 1 refills | Status: DC

## 2019-06-09 MED ORDER — AMLODIPINE BESYLATE 5 MG PO TABS: 5.0000 mg | ORAL_TABLET | Freq: Every day | ORAL | 1 refills | Status: DC

## 2019-06-09 MED ORDER — LISINOPRIL 40 MG PO TABS: 40.0000 mg | ORAL_TABLET | Freq: Every day | ORAL | 1 refills | Status: DC

## 2019-06-09 NOTE — Progress Notes (Signed)
Jenna Stokes DOB: 1965-11-13 Encounter date: 06/09/2019  This is a 54 y.o. female who presents to establish care. Chief Complaint  Patient presents with  . Establish Care    History of present illness: "I don't take my medicine". 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   About a month ago got to where she couldn't breathe. Sometimes forgets and doesn't take medicine. Lost medications and went a whole month without any meds which she felt was related. Once she restarted medications then she felt fine.   Was sent to Dr. Talmage Nap for DM management.  Enjoyed the fact that she was cut back on her medications when she had this visit.  She has not followed up in probably a year.  She does not check her sugars.  She does not follow a diabetic diet.  She states that she eats and drinks whenever she wants.  She enjoys sweets and eating out.  She drinks sodas.  She has a significant family history of diabetes and renal failure.  She feels that if she takes medication she will accelerate her road to having a kidney failure and diabetic complications secondary to the medications, so she is always has not been compliant and has taken only half of her recommended doses.  Hypertension: lisinopril 40 mg, spironolactone 25 mg twice daily.  She has been compliant with taking these medications in the recent months.  She was previously on amlodipine, but has not taken this in multiple months.  Type 2 diabetes: Metformin 500 mg XR twice daily.  In the last month, Metformin is causing diarrhea.  She only has it when she takes the medication and she does not have it when she does not take the medication.  This does not bother her stomach previously and she is uncertain why it is just started to bother her.  She is due for blood work. Last visit with Dr. Talmage Nap was in summer (thinks may-June).   Pulmonary nodule noted in August/2019 on CT:  5.1 mm right middle lobe nodule. Non-contrast chest CT at  6-12 months is recommended. If the nodule is stable at time of repeat CT, then future CT at 18-24 months (from today's scan) is considered optional for low-risk patients, but is recommended for high-risk patients. This recommendation follows the consensus statement: Guidelines for Management of Incidental Pulmonary Nodules Detected on CT Images: From the Fleischner Society 2017; Radiology 2017; 284:228-243.  Past Medical History:  Diagnosis Date  . 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 Hyperlipidemia   . Hypertension   . Morbid obesity (HCC)    Past Surgical History:  Procedure Laterality Date  . ABDOMINAL HYSTERECTOMY    . CESAREAN SECTION    . TONSILLECTOMY     Allergies  Allergen Reactions  . Penicillins Other (See Comments)    Unknown childhood reaction Has patient had a PCN reaction causing immediate rash, facial/tongue/throat swelling, SOB or lightheadedness with hypotension: Unknown Has patient had a PCN reaction causing severe rash involving mucus membranes or skin necrosis: Unknown Has patient had a PCN reaction that required hospitalization: Unknown Has patient had a PCN reaction occurring within the last 10 years: No If all of the above answers are "NO", then may proceed with Cephalosporin use.    Current Meds  Medication Sig  . albuterol (VENTOLIN HFA) 108 (90 Base) MCG/ACT inhaler Inhale 2 puffs into the lungs every 6 (six) hours as needed for shortness of breath.  Marland Kitchen  lisinopril (ZESTRIL) 40 MG tablet Take 1 tablet (40 mg total) by mouth daily.  . metFORMIN (GLUCOPHAGE-XR) 500 MG 24 hr tablet Take 1 tablet (500 mg total) by mouth daily with breakfast.  . [DISCONTINUED] albuterol (VENTOLIN HFA) 108 (90 Base) MCG/ACT inhaler Inhale 2 puffs into the lungs every 6 (six) hours as needed for shortness of breath.  . [DISCONTINUED] lisinopril (PRINIVIL,ZESTRIL) 40 MG tablet Take 1 tablet (40 mg total) by mouth daily.  . [DISCONTINUED]  metFORMIN (GLUCOPHAGE-XR) 500 MG 24 hr tablet TAKE ONE TABLET BY MOUTH DAILY WITH BREAKFAST (Patient taking differently: 2 (two) times daily. )  . [DISCONTINUED] spironolactone (ALDACTONE) 25 MG tablet Take 1 tablet (25 mg total) by mouth daily. (Patient taking differently: Take 25 mg by mouth 2 (two) times daily. )   Social History   Tobacco Use  . Smoking status: Never Smoker  . Smokeless tobacco: Never Used  Substance Use Topics  . Alcohol use: No   Family History  Problem Relation Age of Onset  . Heart disease Mother   . Diabetes Mother   . Hypertension Sister   . Diabetes Sister   . Adrenal disorder Sister      Review of Systems  Constitutional: Negative for chills, fatigue and fever.  Respiratory: Negative for cough, chest tightness, shortness of breath and wheezing.   Cardiovascular: Negative for chest pain, palpitations and leg swelling.  Genitourinary: Positive for menstrual problem (has had some blood with wiping).    Objective:  BP (!) 160/98 (BP Location: Left Arm, Patient Position: Sitting, Cuff Size: Large)   Pulse 80   Temp 97.8 F (36.6 C) (Temporal)   Ht 5\' 9"  (1.753 m)   Wt (!) 368 lb 11.2 oz (167.2 kg)   LMP 08/10/2006   SpO2 98%   BMI 54.45 kg/m   Weight: (!) 368 lb 11.2 oz (167.2 kg)   BP Readings from Last 3 Encounters:  06/09/19 (!) 160/98  01/15/18 (!) 152/78  01/12/18 (!) 129/50   Wt Readings from Last 3 Encounters:  06/09/19 (!) 368 lb 11.2 oz (167.2 kg)  01/15/18 (!) 357 lb 6.4 oz (162.1 kg)  01/12/18 (!) 362 lb 3.2 oz (164.3 kg)    Physical Exam Constitutional:      General: She is not in acute distress.    Appearance: She is well-developed. She is obese.  Cardiovascular:     Rate and Rhythm: Normal rate and regular rhythm.     Heart sounds: Normal heart sounds. No murmur. No friction rub.  Pulmonary:     Effort: Pulmonary effort is normal. No respiratory distress.     Breath sounds: Normal breath sounds. No wheezing or rales.   Musculoskeletal:     Right lower leg: No edema.     Left lower leg: No edema.  Neurological:     Mental Status: She is alert and oriented to person, place, and time.  Psychiatric:        Behavior: Behavior normal.     Assessment/Plan:  1. Hypertension associated with diabetes (HCC) Blood pressures poorly controlled.  I spent time addressing her concerns with medication and complications of this and I tried to emphasize the benefit of medications with regards to the damage caused by elevated blood pressure and elevated blood sugars on kidneys and vascular health.  We will continue to discuss and work on education to this end.  Amlodipine has been added back into her regimen and we will recheck pressures at her next visit. - amLODipine (  NORVASC) 5 MG tablet; Take 1 tablet (5 mg total) by mouth daily.  Dispense: 90 tablet; Refill: 1 - CBC with Differential/Platelet; Future - Comprehensive metabolic panel; Future - Comprehensive metabolic panel - CBC with Differential/Platelet  2. Hyperlipidemia associated with type 2 diabetes mellitus ([place]) Not currently on a statin.  Will discuss benefits of this with her once we obtain blood work.  - Lipid panel; Future - TSH; Future - TSH - Lipid panel  3. Pulmonary nodule Due for follow-up CT per pulmonary nodule protocol.  Will order.  4. Hematuria, unspecified type - Urinalysis; Future - Culture, Urine; Future - Culture, Urine - Urinalysis  5. Type 2 diabetes mellitus without complication, without long-term current use of insulin ([place]) She is not following diabetic diet and currently is having difficulty with tolerating Metformin.  I discussed that there are many options of treatment classes for diabetes and that diabetes management now is currently very different than it was even just a few years ago when her mom is being treated.  She is following with Dr. Debbora Presto, but is overdue for an appointment.  Further recommendations pending blood  work results. - Hemoglobin A1c; Future - Microalbumin / creatinine urine ratio; Future - Microalbumin / creatinine urine ratio - Hemoglobin A1c  6. Vaginal spotting This was mentioned at the end of her visit.  She has had a hysterectomy but is noting some spotting.  We will bring her back for a Pap smear.   7.  Class III severe obesity She has some interest in losing weight but has difficulty with the slow process of weight loss.  She feels that she gets frustrated with not seeing results and does not have a lot of motivation to stay with dietary plan.  She has some interest in weight loss surgery, especially because she thinks it would benefit her health overall.  I think this would be reasonable option for her to look into, but we also discussed the importance of finding her "why" for getting healthier so that she can be committed to making healthier changes.  I encouraged her to consider options at this point that she is most interested in, whether it is following up with the weight loss clinic, and option like [facility], or meeting with weight loss surgeon.  Certainly weight loss would be helpful for her health overall, likely blood sugar control, blood pressure, and cardiovascular health.     Return for complete physical 30 minutes ok to put in virtual slot for in office next week.  Micheline Rough, MD   Total time of visit was over 45 minutes with health discussion, record review, previous lab review, medication side effect discussion

## 2019-06-10 LAB — URINE CULTURE
MICRO NUMBER:: 10013383
Result:: NO GROWTH
SPECIMEN QUALITY:: ADEQUATE

## 2019-06-11 ENCOUNTER — Telehealth: Payer: Self-pay | Admitting: *Deleted

## 2019-06-11 NOTE — Telephone Encounter (Signed)
Left a message for the pt to return my call.  CRM also created.  

## 2019-06-11 NOTE — Telephone Encounter (Signed)
-----   Message from Wynn Banker, MD sent at 06/11/2019 12:15 AM EST ----- She had pulm nodule 01/2018. I do not see that this was followed up with imaging in 6-12 mo as suggested. I have ordered repeat chest CT to re-evaluate. If she had this done elsewhere and we do not have record, let me know please.

## 2019-06-15 ENCOUNTER — Other Ambulatory Visit: Payer: Self-pay

## 2019-06-16 ENCOUNTER — Ambulatory Visit (INDEPENDENT_AMBULATORY_CARE_PROVIDER_SITE_OTHER): Payer: BC Managed Care – PPO | Admitting: Family Medicine

## 2019-06-16 ENCOUNTER — Other Ambulatory Visit (HOSPITAL_COMMUNITY)
Admission: RE | Admit: 2019-06-16 | Discharge: 2019-06-16 | Disposition: A | Discharge status: Home / Self Care | Payer: BC Managed Care – PPO | Source: Ambulatory Visit | Attending: Family Medicine | Admitting: Family Medicine

## 2019-06-16 ENCOUNTER — Encounter: Payer: Self-pay | Admitting: Family Medicine

## 2019-06-16 VITALS — BP 148/90 | HR 98 | Temp 97.9°F | Ht 70.0 in | Wt 370.3 lb

## 2019-06-16 DIAGNOSIS — E1169 Type 2 diabetes mellitus with other specified complication: Secondary | ICD-10-CM | POA: Diagnosis not present

## 2019-06-16 DIAGNOSIS — E1165 Type 2 diabetes mellitus with hyperglycemia: Secondary | ICD-10-CM

## 2019-06-16 DIAGNOSIS — Z124 Encounter for screening for malignant neoplasm of cervix: Secondary | ICD-10-CM | POA: Insufficient documentation

## 2019-06-16 DIAGNOSIS — Z0000 Encounter for general adult medical examination without abnormal findings: Secondary | ICD-10-CM

## 2019-06-16 DIAGNOSIS — E785 Hyperlipidemia, unspecified: Secondary | ICD-10-CM

## 2019-06-16 DIAGNOSIS — I10 Essential (primary) hypertension: Secondary | ICD-10-CM

## 2019-06-16 DIAGNOSIS — E1159 Type 2 diabetes mellitus with other circulatory complications: Secondary | ICD-10-CM

## 2019-06-16 DIAGNOSIS — R911 Solitary pulmonary nodule: Secondary | ICD-10-CM

## 2019-06-16 DIAGNOSIS — N6320 Unspecified lump in the left breast, unspecified quadrant: Secondary | ICD-10-CM

## 2019-06-16 DIAGNOSIS — I152 Hypertension secondary to endocrine disorders: Secondary | ICD-10-CM

## 2019-06-16 MED ORDER — BLOOD GLUCOSE MONITOR KIT: PACK | 0 refills | Status: DC

## 2019-06-16 NOTE — Patient Instructions (Signed)
Check fasting morning sugars and post meal (2-3 hour) sugars. Can also check pre-lunch sugar.

## 2019-06-16 NOTE — Progress Notes (Signed)
Jenna Stokes DOB: 06-20-1965 Encounter date: 06/16/2019  This is a 54 y.o. female who presents for complete physical   History of present illness/Additional concerns: Stopped with vaginal spotting 2 days ago. Had this for about 3 weeks before it stopped. Not painful.   Got metformin when she left here last and new batch isn't bothering her stomach.   Had considered weight loss surgery to help with weight loss. Gets frustrated with diet and even with exercise because she doesn't feel like she loses weight.   Hasn't been checking bp at home. Started amlodipine 1 week ago.    Past Medical History:  Diagnosis Date  . DM type 2, uncontrolled, with renal complications ([place]) 1/82/9937  . Hyperlipidemia   . Hypertension   . Morbid obesity ([place])    Past Surgical History:  Procedure Laterality Date  . ABDOMINAL HYSTERECTOMY    . CESAREAN SECTION    . TONSILLECTOMY     Allergies  Allergen Reactions  . Penicillins Other (See Comments)    Unknown childhood reaction Has patient had a PCN reaction causing immediate rash, facial/tongue/throat swelling, SOB or lightheadedness with hypotension: Unknown Has patient had a PCN reaction causing severe rash involving mucus membranes or skin necrosis: Unknown Has patient had a PCN reaction that required hospitalization: Unknown Has patient had a PCN reaction occurring within the last 10 years: No If all of the above answers are "NO", then may proceed with Cephalosporin use.    No outpatient medications have been marked as taking for the 06/16/19 encounter (Office Visit) with Caren Macadam, MD.   Social History   Tobacco Use  . Smoking status: Never Smoker  . Smokeless tobacco: Never Used  Substance Use Topics  . Alcohol use: No   Family History  Problem Relation Age of Onset  . Heart disease Mother   . Diabetes Mother   . Hypertension Sister   . Diabetes Sister   . Adrenal disorder Sister      Review of Systems   Constitutional: Negative for activity change, appetite change, chills, fatigue, fever and unexpected weight change.  HENT: Positive for congestion (allergies). Negative for ear pain, hearing loss, sinus pressure, sinus pain, sore throat and trouble swallowing.   Eyes: Negative for pain and visual disturbance.  Respiratory: Negative for cough, chest tightness, shortness of breath and wheezing.   Cardiovascular: Negative for chest pain, palpitations and leg swelling.  Gastrointestinal: Negative for abdominal pain, blood in stool, constipation, diarrhea, nausea and vomiting.  Genitourinary: Negative for difficulty urinating and menstrual problem.  Musculoskeletal: Negative for arthralgias and back pain.  Skin: Negative for rash.  Neurological: Negative for dizziness, weakness, numbness and headaches.  Hematological: Negative for adenopathy. Does not bruise/bleed easily.  Psychiatric/Behavioral: Negative for sleep disturbance and suicidal ideas. The patient is not nervous/anxious.     CBC:  Lab Results  Component Value Date   WBC 9.2 06/09/2019   HGB 13.3 06/09/2019   HCT 39.6 06/09/2019   MCH 29.6 01/09/2018   MCHC 33.7 06/09/2019   RDW 13.2 06/09/2019   PLT 204.0 06/09/2019   CMP: Lab Results  Component Value Date   NA 139 06/09/2019   K 4.6 06/09/2019   CL 105 06/09/2019   CO2 26 06/09/2019   ANIONGAP 7 01/11/2018   GLUCOSE 153 (H) 06/09/2019   BUN 21 06/09/2019   CREATININE 1.08 06/09/2019   CREATININE 0.79 08/25/2015   GFRAA >60 01/11/2018   GFRAA >89 08/25/2015   CALCIUM 10.7 (H) 06/09/2019  PROT 6.9 06/09/2019   BILITOT 0.3 06/09/2019   ALKPHOS 60 06/09/2019   ALT 19 06/09/2019   AST 17 06/09/2019   LIPID: Lab Results  Component Value Date   CHOL 219 (H) 06/09/2019   TRIG 300.0 (H) 06/09/2019   HDL 41.70 06/09/2019   LDLCALC 88 01/09/2018    Objective:  BP (!) 148/90   Pulse 98   Temp 97.9 F (36.6 C) (Temporal)   Ht 5' 10"  (1.778 m)   Wt (!) 370 lb  4.8 oz (168 kg)   LMP 08/10/2006   SpO2 98%   BMI 53.13 kg/m   Weight: (!) 370 lb 4.8 oz (168 kg)   BP Readings from Last 3 Encounters:  06/17/19 (!) 148/90  06/09/19 (!) 160/98  01/15/18 (!) 152/78   Wt Readings from Last 3 Encounters:  06/16/19 (!) 370 lb 4.8 oz (168 kg)  06/09/19 (!) 368 lb 11.2 oz (167.2 kg)  01/15/18 (!) 357 lb 6.4 oz (162.1 kg)    Physical Exam Exam conducted with a chaperone present.  Constitutional:      General: She is not in acute distress.    Appearance: She is well-developed.  HENT:     Head: Normocephalic and atraumatic.     Right Ear: External ear normal.     Left Ear: External ear normal.     Mouth/Throat:     Pharynx: No oropharyngeal exudate.  Eyes:     Conjunctiva/sclera: Conjunctivae normal.     Pupils: Pupils are equal, round, and reactive to light.  Neck:     Thyroid: No thyromegaly.  Cardiovascular:     Rate and Rhythm: Normal rate and regular rhythm.     Heart sounds: Normal heart sounds. No murmur. No friction rub. No gallop.   Pulmonary:     Effort: Pulmonary effort is normal.     Breath sounds: Normal breath sounds.  Chest:     Breasts:        Right: Normal. No nipple discharge.        Left: Mass (fullness left breast 3 oclock approx 4cm) and tenderness (slight left breast) present. No nipple discharge or skin change.  Abdominal:     General: Bowel sounds are normal. There is no distension.     Palpations: Abdomen is soft. There is no mass.     Tenderness: There is no abdominal tenderness (slight left sided). There is no guarding.     Hernia: No hernia is present.     Comments: There is some fullness left side abdomen; difficult exam due to habitus  Genitourinary:    General: Normal vulva.     Exam position: Supine.     Pubic Area: No rash.      Vagina: Vaginal discharge (grey-white) present.     Cervix: Friability, erythema and cervical bleeding (with spatula/brush) present.     Adnexa:        Right: No tenderness.          Left: No tenderness.       Comments: Exam is more difficult due to habitus Musculoskeletal:        General: No tenderness or deformity. Normal range of motion.     Cervical back: Normal range of motion and neck supple.  Lymphadenopathy:     Cervical: No cervical adenopathy.     Upper Body:     Right upper body: No supraclavicular or axillary adenopathy.     Left upper body: No supraclavicular or axillary adenopathy.  Skin:    General: Skin is warm and dry.     Findings: No rash.  Neurological:     Mental Status: She is alert and oriented to person, place, and time.     Deep Tendon Reflexes: Reflexes normal.     Reflex Scores:      Tricep reflexes are 2+ on the right side and 2+ on the left side.      Bicep reflexes are 2+ on the right side and 2+ on the left side.      Brachioradialis reflexes are 2+ on the right side and 2+ on the left side.      Patellar reflexes are 2+ on the right side and 2+ on the left side. Psychiatric:        Speech: Speech normal.        Behavior: Behavior normal.        Thought Content: Thought content normal.     Assessment/Plan: Health Maintenance Due  Topic Date Due  . FOOT EXAM  03/31/2018   Health Maintenance reviewed - due for mammogram.  1. Preventative health care She is received Cologuard but not turned in previously. I will check on this again. She is overdue for mammogram and has agreed to complete this. Mammogram is ordered. - MM Digital Screening Unilat R; Future  2. Cervical cancer screening She has had some postmenopausal spotting. Consider uterine ultrasound pending results. - PAP [[place]]  3. Hyperlipidemia associated with type 2 diabetes mellitus ([place]) Cholesterol is higher than goal, especially with diabetes. She does not like medication and does not want to take a statin at this time. We will continue to monitor and continue to discuss.  4. Morbid obesity ([place]) She has some interest in weight loss surgery, but at  this time would like to work on increasing the Metformin and working on healthier eating. We reviewed carbohydrate counting with diet plan and I encouraged her to get an idea of daily caloric intake using an app like my fitness pal. All information was reviewed together in the office and notes were written down for her. I have also discussed other options with her including meeting with the weight loss specialist or dietitian.  5. Lung nodule CT of the chest has been ordered to follow-up on lung nodule. She has some slight left-sided abdominal discomfort, so we may need additional abdominal imaging in the future and I have told her she can wait to schedule CT of the chest until we get Pap smear results back and plan follow-up for postmenopausal spotting.  6. Breast mass, left - MM Digital Diagnostic Unilat L; Future  7. Uncontrolled type 2 diabetes mellitus with hyperglycemia ([place]) She does not wish to start additional medication at this time but is willing to increase her Metformin to 1000 mg daily. See above regarding dietary d discussion. She is supposed to be following with endocrinology but has not done so. - Hemoglobin A1c; Future - blood glucose meter kit and supplies KIT; Dispense based on patient and insurance preference. Use up to four times daily as directed. (FOR ICD-9 250.00, 250.01).  Dispense: 1 each; Refill: 0 - Comprehensive metabolic panel; Future  8. Hypercalcemia - PTH, Intact and Calcium; Future  9. Hypertension: Suboptimal control but just added back in amlodipine. I encouraged her to monitor at home and we will check back in with 1 month to see how pressures are doing.  Return in about 1 month (around 07/17/2019) for Chronic condition follow-up virtually.  Micheline Rough, MD    We reviewed all blood work in detail. I stressed the importance of blood pressure control as well as blood sugar control to help preserve kidney function.

## 2019-06-17 ENCOUNTER — Telehealth: Payer: Self-pay | Admitting: *Deleted

## 2019-06-17 NOTE — Telephone Encounter (Signed)
Spoke with the pt and she stated she is not sure what happened to the test, feels it may be at her home, will look for this and call back if needed.

## 2019-06-17 NOTE — Telephone Encounter (Signed)
-----   Message from Wynn Banker, MD sent at 06/17/2019  1:19 PM EST ----- Did she ever complete Cologuard? I am not seeing results, but I see that Dr. Selena Batten had discussion with her about it in the past and it was ordered.

## 2019-06-17 NOTE — Telephone Encounter (Signed)
See office notes-discussed during patients physical exam.

## 2019-06-18 ENCOUNTER — Ambulatory Visit: Payer: BC Managed Care – PPO | Attending: Internal Medicine

## 2019-06-18 DIAGNOSIS — Z20822 Contact with and (suspected) exposure to covid-19: Secondary | ICD-10-CM

## 2019-06-18 LAB — CYTOLOGY - PAP
Adequacy: ABSENT
Comment: NEGATIVE
Diagnosis: NEGATIVE
High risk HPV: NEGATIVE

## 2019-06-19 LAB — NOVEL CORONAVIRUS, NAA: SARS-CoV-2, NAA: NOT DETECTED

## 2019-06-21 ENCOUNTER — Other Ambulatory Visit: Payer: Self-pay | Admitting: Family Medicine

## 2019-06-21 DIAGNOSIS — N950 Postmenopausal bleeding: Secondary | ICD-10-CM

## 2019-06-21 MED ORDER — METRONIDAZOLE 500 MG PO TABS: 500.0000 mg | ORAL_TABLET | Freq: Two times a day (BID) | ORAL | 0 refills | Status: DC

## 2019-06-23 ENCOUNTER — Ambulatory Visit: Payer: BC Managed Care – PPO | Attending: Internal Medicine

## 2019-06-23 DIAGNOSIS — Z20822 Contact with and (suspected) exposure to covid-19: Secondary | ICD-10-CM

## 2019-06-24 ENCOUNTER — Telehealth: Payer: Self-pay | Admitting: *Deleted

## 2019-06-24 DIAGNOSIS — R911 Solitary pulmonary nodule: Secondary | ICD-10-CM

## 2019-06-24 LAB — NOVEL CORONAVIRUS, NAA: SARS-CoV-2, NAA: NOT DETECTED

## 2019-06-24 NOTE — Telephone Encounter (Signed)
Dr Hassan Rowan received a fax from [facility] stating the pt does not qualify for the lung cancer screening program and the referral was cancelled.  Dr Hassan Rowan requested what should be ordered in this case for a follow up of a known nodule.  I called [facility] and spoke with the radiologist Dr Ova Freshwater and informed him of the information above.  Dr Ova Freshwater stated to order a non contrast CT chest and the order was placed.

## 2019-06-25 ENCOUNTER — Encounter: Payer: Self-pay | Admitting: Family Medicine

## 2019-06-25 ENCOUNTER — Telehealth: Payer: Self-pay | Admitting: Family Medicine

## 2019-06-25 DIAGNOSIS — J329 Chronic sinusitis, unspecified: Secondary | ICD-10-CM

## 2019-06-25 NOTE — Telephone Encounter (Signed)
My Chart message sent

## 2019-06-25 NOTE — Telephone Encounter (Signed)
Pt called in regards to her covid test coming back negative. She still has a loss of taste. She needs an okay from [employer] in order to go back to work, but mentioned that Dr. Hassan Rowan said something about testing for a certain thing (she wasn't sure what it was). Not sure if we should schedule a virtual or office visit. Pt can be contacted at [phone]

## 2019-06-25 NOTE — Telephone Encounter (Signed)
Patient informed of negative covid-19 result. Patient verbalized understanding.  °

## 2019-06-28 ENCOUNTER — Telehealth: Payer: Self-pay | Admitting: Family Medicine

## 2019-06-28 ENCOUNTER — Encounter: Payer: Self-pay | Admitting: Family Medicine

## 2019-06-28 MED ORDER — DOXYCYCLINE HYCLATE 100 MG PO TABS: 100.0000 mg | ORAL_TABLET | Freq: Two times a day (BID) | ORAL | 0 refills | Status: DC

## 2019-06-28 NOTE — Telephone Encounter (Signed)
  Bobette D. Dingee 1966/03/04 called she said she has been sending my chart messages and calling and she said she has been waiting a call back from CMA . She also said a My chart was sent to her and she can not open it  She said she would like a call back today at [phone]

## 2019-06-28 NOTE — Telephone Encounter (Signed)
Called patient and spoke with her. 

## 2019-06-28 NOTE — Telephone Encounter (Signed)
Had what she thought was winter cold before christmas. Lasted 1-2 weeks. After that started feeling worse. No loss of taste or smell. Thought she had sinus infection back in November, was able to treat this with OTC medications.   Right now throat is clear (was burning for 1.5 weeks). No headache. Still in middle of night with chills, cold/hot flashes, sweats. Sugar was 105 this morning.   Not coughing much. Has improved with this. Also had improvement in congestion.  Still just not feeling great overall.  Continues with feeling hot cold and having chills.  Still has some cough.  Did have more congestion but has spent the last few weeks continuously blowing her nose.  Has had some bloody noses as a result of this.  Blood pressure staying in the 140 systolic.  Since she has had ongoing congestion and sinus symptoms for approximately 5 weeks, and is continuing to have some chills and overall not feeling great, I think it is worthwhile to send in an antibiotic for her and see if this improves symptoms.  She will update me through my chart later in the week so that we can write a return to work note.  She has had some nausea and upset stomach with the Flagyl using to treat her trichomonas infection.

## 2019-06-30 ENCOUNTER — Encounter: Payer: Self-pay | Admitting: Family Medicine

## 2019-06-30 ENCOUNTER — Other Ambulatory Visit: Payer: Self-pay | Admitting: Family Medicine

## 2019-06-30 DIAGNOSIS — N6320 Unspecified lump in the left breast, unspecified quadrant: Secondary | ICD-10-CM

## 2019-07-02 ENCOUNTER — Ambulatory Visit
Admission: RE | Admit: 2019-07-02 | Discharge: 2019-07-02 | Disposition: A | Discharge status: Home / Self Care | Payer: BC Managed Care – PPO | Source: Ambulatory Visit | Attending: Family Medicine | Admitting: Family Medicine

## 2019-07-02 ENCOUNTER — Other Ambulatory Visit: Payer: Self-pay

## 2019-07-02 ENCOUNTER — Other Ambulatory Visit: Payer: Self-pay | Admitting: Family Medicine

## 2019-07-02 DIAGNOSIS — N6320 Unspecified lump in the left breast, unspecified quadrant: Secondary | ICD-10-CM

## 2019-07-02 DIAGNOSIS — N6310 Unspecified lump in the right breast, unspecified quadrant: Secondary | ICD-10-CM

## 2019-07-02 DIAGNOSIS — N950 Postmenopausal bleeding: Secondary | ICD-10-CM

## 2019-07-02 DIAGNOSIS — R911 Solitary pulmonary nodule: Secondary | ICD-10-CM

## 2019-07-04 ENCOUNTER — Encounter: Payer: Self-pay | Admitting: Family Medicine

## 2019-07-06 ENCOUNTER — Other Ambulatory Visit: Payer: Self-pay | Admitting: *Deleted

## 2019-07-06 ENCOUNTER — Telehealth: Payer: Self-pay | Admitting: *Deleted

## 2019-07-06 DIAGNOSIS — N6320 Unspecified lump in the left breast, unspecified quadrant: Secondary | ICD-10-CM

## 2019-07-06 NOTE — Telephone Encounter (Signed)
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  Requests a note to be out from 2/1-2/2.  Message sent to PCP.

## 2019-07-07 ENCOUNTER — Encounter: Payer: Self-pay | Admitting: *Deleted

## 2019-07-07 NOTE — Telephone Encounter (Signed)
I called the pt and informed her of the message below.  Patient is aware the letter was completed and sent via Mychart.

## 2019-07-07 NOTE — Telephone Encounter (Signed)
Okay for note

## 2019-07-13 NOTE — Telephone Encounter (Signed)
Jenna Stokes with ([employer] [place]) at [phone] is call to see if the short term form has been received by the office and to let them know when it will be faxed back.

## 2019-07-13 NOTE — Telephone Encounter (Signed)
Dr Hassan Rowan received paperwork and questioned the reason for this to be completed.  Left a message for the Jenna Stokes to return my call.

## 2019-07-14 NOTE — Telephone Encounter (Signed)
Patient called back and stated the forms are required for her job from 06/16/2019-07/06/2019 which she was previously given notes to be out of work by Dr Hassan Rowan due to COVID symptoms.  Forms placed on Dr Dorita Fray desk and message forwarded.

## 2019-07-16 ENCOUNTER — Encounter: Payer: Self-pay | Admitting: Family Medicine

## 2019-07-16 ENCOUNTER — Other Ambulatory Visit: Payer: Self-pay

## 2019-07-16 ENCOUNTER — Telehealth (INDEPENDENT_AMBULATORY_CARE_PROVIDER_SITE_OTHER): Payer: BC Managed Care – PPO | Admitting: Family Medicine

## 2019-07-16 DIAGNOSIS — E1159 Type 2 diabetes mellitus with other circulatory complications: Secondary | ICD-10-CM

## 2019-07-16 DIAGNOSIS — E119 Type 2 diabetes mellitus without complications: Secondary | ICD-10-CM | POA: Diagnosis not present

## 2019-07-16 DIAGNOSIS — I152 Hypertension secondary to endocrine disorders: Secondary | ICD-10-CM

## 2019-07-16 DIAGNOSIS — I10 Essential (primary) hypertension: Secondary | ICD-10-CM

## 2019-07-16 DIAGNOSIS — R911 Solitary pulmonary nodule: Secondary | ICD-10-CM

## 2019-07-16 MED ORDER — RYBELSUS 3 MG PO TABS: 3.0000 mg | ORAL_TABLET | Freq: Every day | ORAL | 0 refills | Status: DC

## 2019-07-16 NOTE — Progress Notes (Signed)
Virtual Visit via Telephone Note  I connected with Jenna Stokes  on 07/16/19 at 11:30 AM EST by telephone and verified that I am speaking with the correct person using two identifiers.   I discussed the limitations, risks, security and privacy concerns of performing an evaluation and management service by telephone and the availability of in person appointments. I also discussed with the patient that there may be a patient responsible charge related to this service. The patient expressed understanding and agreed to proceed.  Location patient: home Location provider: work office Participants present for the call: patient, provider Patient did not have a visit in the prior 7 days to address this/these issue(s).   History of Present Illness: Last visit 1/13 we added back in amlodopine and she was able to restart metformin without side effects. Has been checking at home. Last week or so has been 140-150. Feels better now than she did before. Hasn't ever been very symptomatic with high blood pressure.  Since last visit she completed chest CT (no further eval needed for pulm nodule), mammogram (requires 6 mo bilat [place] due to cysts), pelvic US. She was treated for trichomonas infection which we suspect she had for a couple of years and may have created cervical irritation to point of spotting. She was also treated for sinus infection. Paperwork completed for employer due to missed work secondary to COVID symptoms and requirement of testing/quarentine.   Cancelled follow up visit with Dr. Chalmers Cater. Has not rescheduled. Has been checking her blood sugars. Was 308 other day. Usually states within 200-220 when she checks. So has checked in last 2 days and in last week has been 127-200. Sister gave her chart about how sugars relate to A1C. They are going to work together to get with nutritionist. They are going to go through diet plan together tomorrow.   Stopped eating maxi b's. Stopped soda. Really working  on making healthier changes.    Observations/Objective: Patient sounds cheerful and well on the phone. I do not appreciate any SOB. Speech and thought processing are grossly intact. Patient reported vitals: See above  Assessment and Plan: 1. Hypertension associated with diabetes ([place]) She is on board for increase in amlodipine dose.  She will continue to monitor blood pressures at home.  2. Type 2 diabetes mellitus without complication, without long-term current use of insulin ([place]) She is working with her sister for healthier diet and regular exercise.  She is motivated to make some changes and work on weight loss.  3. Lung nodule No further evaluation needed for lung nodule.   Follow Up Instructions:  Return in about 1 month (around 08/13/2019). I would like to really work on managing blood pressure.  I encouraged her to follow-up with endocrinology for sugar management, although she was willing to try Rybelsus, so instructions given for how to get a savings card for this, and medication was sent in for her.  I am hopeful this will help with weight loss.  We could potentially increase Metformin dose in the future, but she is tolerating right now without GI side effects, so I will keep it as is.  [id number] [id number] [id number] 11-20 [id number] 21-30 I did not refer this patient for an OV in the next 24 hours for this/these issue(s).  I discussed the assessment and treatment plan with the patient. The patient was provided an opportunity to ask questions and all were answered. The patient agreed with the plan and demonstrated an understanding of the instructions.  The patient was advised to call back or seek an in-person evaluation if the symptoms worsen or if the condition fails to improve as anticipated.  I provided 27 minutes of non-face-to-face time during this encounter.   Theodis Shove, MD

## 2019-07-20 ENCOUNTER — Telehealth: Payer: Self-pay | Admitting: *Deleted

## 2019-07-20 NOTE — Telephone Encounter (Signed)
Spoke with the pt and scheduled an appt for 4/14 to arrive at 11:15am.

## 2019-07-20 NOTE — Telephone Encounter (Signed)
-----   Message from Wynn Banker, MD sent at 07/16/2019 12:08 PM EST ----- Please set up follow up visit in 1-2 months. I'm ok with either in office or virtual.

## 2019-08-13 ENCOUNTER — Other Ambulatory Visit: Payer: Self-pay | Admitting: Family Medicine

## 2019-08-13 DIAGNOSIS — I152 Hypertension secondary to endocrine disorders: Secondary | ICD-10-CM

## 2019-08-13 DIAGNOSIS — E1159 Type 2 diabetes mellitus with other circulatory complications: Secondary | ICD-10-CM

## 2019-09-14 ENCOUNTER — Other Ambulatory Visit: Payer: Self-pay

## 2019-09-15 ENCOUNTER — Ambulatory Visit (INDEPENDENT_AMBULATORY_CARE_PROVIDER_SITE_OTHER): Payer: BC Managed Care – PPO | Admitting: Family Medicine

## 2019-09-15 ENCOUNTER — Encounter: Payer: Self-pay | Admitting: Family Medicine

## 2019-09-15 VITALS — BP 160/82 | HR 71 | Temp 97.6°F | Ht 70.0 in | Wt 362.5 lb

## 2019-09-15 DIAGNOSIS — I10 Essential (primary) hypertension: Secondary | ICD-10-CM

## 2019-09-15 DIAGNOSIS — E1169 Type 2 diabetes mellitus with other specified complication: Secondary | ICD-10-CM

## 2019-09-15 DIAGNOSIS — E1159 Type 2 diabetes mellitus with other circulatory complications: Secondary | ICD-10-CM

## 2019-09-15 DIAGNOSIS — E119 Type 2 diabetes mellitus without complications: Secondary | ICD-10-CM

## 2019-09-15 DIAGNOSIS — I152 Hypertension secondary to endocrine disorders: Secondary | ICD-10-CM

## 2019-09-15 DIAGNOSIS — E785 Hyperlipidemia, unspecified: Secondary | ICD-10-CM

## 2019-09-15 LAB — COMPREHENSIVE METABOLIC PANEL
ALT: 21 U/L (ref 0–35)
AST: 16 U/L (ref 0–37)
Albumin: 4.6 g/dL (ref 3.5–5.2)
Alkaline Phosphatase: 55 U/L (ref 39–117)
BUN: 21 mg/dL (ref 6–23)
CO2: 24 mEq/L (ref 19–32)
Calcium: 10.8 mg/dL — ABNORMAL HIGH (ref 8.4–10.5)
Chloride: 103 mEq/L (ref 96–112)
Creatinine, Ser: 1.12 mg/dL (ref 0.40–1.20)
GFR: 61.44 mL/min (ref >60.00)
Glucose, Bld: 132 mg/dL — ABNORMAL HIGH (ref 70–99)
Potassium: 4.4 mEq/L (ref 3.5–5.1)
Sodium: 135 mEq/L (ref 135–145)
Total Bilirubin: 0.5 mg/dL (ref 0.2–1.2)
Total Protein: 7 g/dL (ref 6.0–8.3)

## 2019-09-15 LAB — HEMOGLOBIN A1C: Hgb A1c MFr Bld: 7.8 % — ABNORMAL HIGH (ref 4.6–6.5)

## 2019-09-15 MED ORDER — OLMESARTAN-AMLODIPINE-HCTZ 40-10-25 MG PO TABS: 1.0000 | ORAL_TABLET | Freq: Every day | ORAL | 1 refills | Status: DC

## 2019-09-15 MED ORDER — SPIRONOLACTONE 100 MG PO TABS: 100.0000 mg | ORAL_TABLET | Freq: Every day | ORAL | 1 refills | Status: DC

## 2019-09-15 MED ORDER — METFORMIN HCL ER 500 MG PO TB24: 1000.0000 mg | ORAL_TABLET | Freq: Every day | ORAL | 1 refills | Status: DC

## 2019-09-15 NOTE — Progress Notes (Signed)
Jenna Stokes DOB: [birth year] Encounter date: 09/15/2019  This is a 54 y.o. female who presents with Chief Complaint  Patient presents with  . Follow-up    History of present illness: HL: has declined statin therapy Obesity: at alst visit was working on exercise/healthier eating DM: metformin increased to 1017m daily; working with sister at last visit to plan out diet. Had stopped soda and cut out maxi B's at last visit.   Has felt good; has been taking her medicine and sometimes doubles her medicine. Frustrated that bp isn't better today.  She does state that she has doubled up on every single medication including all of her blood pressure medicines.  Stopped all bread and white breads. Eating at [employer]; really working on changing her eating habits. Cooking at home. No more fast foods. Does have grilled chicken wrapped in lettuce for breakfast with water.   Has been walking some but not a lot. Thinks she lost 12 lbs initially.   Has cut out all pepsi. Has been taking the metformin; didn't start the rybelsus.   HTN: has been checking at home. Lowest diastolic 68. Highest she has seen was 180's/mis 80's but average 140's-150's/70's. Has been taking 14mamlodipine in the morning. Taking 8014mf the lisinopril. spirinolactone 100m47m  Allergies  Allergen Reactions  . Penicillins Other (See Comments)    Unknown childhood reaction Has patient had a PCN reaction causing immediate rash, facial/tongue/throat swelling, SOB or lightheadedness with hypotension: Unknown Has patient had a PCN reaction causing severe rash involving mucus membranes or skin necrosis: Unknown Has patient had a PCN reaction that required hospitalization: Unknown Has patient had a PCN reaction occurring within the last 10 years: No If all of the above answers are "NO", then may proceed with Cephalosporin use.    Current Meds  Medication Sig  . albuterol (VENTOLIN HFA) 108 (90 Base) MCG/ACT inhaler  Inhale 2 puffs into the lungs every 6 (six) hours as needed for shortness of breath.  . blood glucose meter kit and supplies KIT Dispense based on patient and insurance preference. Use up to four times daily as directed. (FOR ICD-9 250.00, 250.01).  . metFORMIN (GLUCOPHAGE-XR) 500 MG 24 hr tablet Take 2 tablets (1,000 mg total) by mouth daily with breakfast.  . Semaglutide (RYBELSUS) 3 MG TABS Take 3 mg by mouth daily.  . [DISCONTINUED] amLODipine (NORVASC) 5 MG tablet TAKE ONE TABLET BY MOUTH DAILY  . [DISCONTINUED] lisinopril (ZESTRIL) 40 MG tablet Take 1 tablet (40 mg total) by mouth daily.  . [DISCONTINUED] metFORMIN (GLUCOPHAGE-XR) 500 MG 24 hr tablet Take 1 tablet (500 mg total) by mouth daily with breakfast.  . [DISCONTINUED] spironolactone (ALDACTONE) 50 MG tablet Take 1 tablet (50 mg total) by mouth daily.    Review of Systems  Constitutional: Negative for chills, fatigue and fever.  Respiratory: Negative for cough, chest tightness, shortness of breath and wheezing.   Cardiovascular: Negative for chest pain, palpitations and leg swelling.    Objective:  BP (!) 160/82 (BP Location: Left Arm, Patient Position: Sitting, Cuff Size: Large)   Pulse 71   Temp 97.6 F (36.4 C) (Temporal)   Ht 5' 10"  (1.778 m)   Wt (!) 362 lb 8 oz (164.4 kg)   LMP 08/10/2006   SpO2 98%   BMI 52.01 kg/m   Weight: (!) 362 lb 8 oz (164.4 kg)   BP Readings from Last 3 Encounters:  09/15/19 (!) 160/82  06/17/19 (!) 148/90  06/09/19 (!)Marland Kitchen  160/98   Wt Readings from Last 3 Encounters:  09/15/19 (!) 362 lb 8 oz (164.4 kg)  06/16/19 (!) 370 lb 4.8 oz (168 kg)  06/09/19 (!) 368 lb 11.2 oz (167.2 kg)    Physical Exam Constitutional:      General: She is not in acute distress.    Appearance: She is well-developed.  Cardiovascular:     Rate and Rhythm: Normal rate and regular rhythm.     Heart sounds: Normal heart sounds. No murmur. No friction rub.  Pulmonary:     Effort: Pulmonary effort is normal.  No respiratory distress.     Breath sounds: Normal breath sounds. No wheezing or rales.  Musculoskeletal:     Right lower leg: No edema.     Left lower leg: No edema.  Neurological:     Mental Status: She is alert and oriented to person, place, and time.  Psychiatric:        Behavior: Behavior normal.     Assessment/Plan  1. Hypertension associated with diabetes ([place]) Blood pressure still with suboptimal control.  We discussed that she cannot safely double up on all medications, and have made some changes to medications today.  She is feeling better about taking medicines and getting health back on track, and we talked through simplifying medicines by putting her on a combination for blood pressure control.  We are going to switch her to spironolactone 100 mg (which she was already taking) and olmesartan-amlodipine-hydrochlorothiazide (40--10-25) daily.  She will continue to check blood pressures at home.  2. Type 2 diabetes mellitus without complication, without long-term current use of insulin ([place]) She has made a lot of positive changes to her dietary intake.  We discussed importance of exercise and frustrations that come with working on weight loss.  She is still considering surgical intervention to help with weight loss.  I discussed that she may benefit from trying the Rybelsus (which she has at home but was afraid to start) in terms of helping with weight loss.  She will make this decision after we get blood work today.  Of note, she was scared to take Rybelsus because she thought that it caused increased risk of amputation, but we reviewed today that that is not seen in the Rybelsus category and hopefully I was able to reassure her of this. - metFORMIN (GLUCOPHAGE-XR) 500 MG 24 hr tablet; Take 2 tablets (1,000 mg total) by mouth daily with breakfast.  Dispense: 180 tablet; Refill: 1  3. Hyperlipidemia associated with type 2 diabetes mellitus ([place]) We will continue to monitor  cholesterol.  Due to her hesitancy for being on medications, I would like to work on blood pressure and blood sugar control first.  4. Morbid obesity ([place]) She is motivated to lose weight.  She is afraid of cutting her lifespan short by not having controlled blood pressure and blood sugars.  She is going to continue to work on healthier eating and then work on regular exercise.  We will continue to monitor and have her follow-up regularly to keep track.  I congratulated her on her initial weight loss today.  5. Hypercalcemia - PTH, Intact and Calcium   Return for pending lab results.      Micheline Rough, MD

## 2019-09-16 LAB — PTH, INTACT AND CALCIUM
Calcium: 11 mg/dL — ABNORMAL HIGH (ref 8.6–10.4)
PTH: 45 pg/mL (ref 14–64)

## 2019-09-18 ENCOUNTER — Encounter: Payer: Self-pay | Admitting: Family Medicine

## 2019-09-20 NOTE — Addendum Note (Signed)
Addended by: Johnella Moloney on: 09/20/2019 12:15 PM   Modules accepted: Orders

## 2019-09-21 ENCOUNTER — Telehealth: Payer: Self-pay | Admitting: *Deleted

## 2019-09-21 NOTE — Telephone Encounter (Signed)
-----   Message from Wynn Banker, MD sent at 09/18/2019  5:00 PM EDT ----- We discussed colonoscopy referral or cologuard and I cannot remember which she wanted/what we discussed! Sorry! Please see which she desires for screening and help with facilitating. Thanks!

## 2019-09-21 NOTE — Telephone Encounter (Signed)
I left a detailed message at the pts cell number with the information below and asked that she return a call with her preference and the order can be entered.

## 2019-10-26 ENCOUNTER — Other Ambulatory Visit: Payer: Self-pay | Admitting: Family Medicine

## 2019-10-26 DIAGNOSIS — E119 Type 2 diabetes mellitus without complications: Secondary | ICD-10-CM

## 2019-10-27 ENCOUNTER — Other Ambulatory Visit: Payer: Self-pay | Admitting: Family Medicine

## 2019-10-27 DIAGNOSIS — E119 Type 2 diabetes mellitus without complications: Secondary | ICD-10-CM

## 2019-10-27 MED ORDER — METFORMIN HCL ER 500 MG PO TB24: 1000.0000 mg | ORAL_TABLET | Freq: Every day | ORAL | 1 refills | Status: DC

## 2019-10-29 ENCOUNTER — Telehealth: Payer: Self-pay | Admitting: *Deleted

## 2019-10-29 NOTE — Telephone Encounter (Signed)
Patient spoke with nurse triage. Patient reported she accidentally took 2 blood pressure medications and she is lightheaded and sweating. Patient was advised to call [facility].

## 2019-10-29 NOTE — Telephone Encounter (Signed)
Called patient.  She did call poison control.  They advised her to stay well-hydrated, eat some bread/food.  Her last blood pressure check was 1 hour ago and blood pressure was 130s over 60s.  She took both the blood pressure medications in about 1 PM.  She is feeling better overall.  She is no longer feeling lightheaded and sweaty.  She will resume normal dosing her blood pressure medications tomorrow.  She will call if any additional concerns.  In the future, she is planning to use a pillbox with days of the week.  Her new blood pressure medication looks very similar to her other, and she thought she was taking 1 of each when in fact she took 2 of the "triple med".

## 2019-11-04 ENCOUNTER — Telehealth: Payer: Self-pay | Admitting: Family Medicine

## 2019-11-04 ENCOUNTER — Encounter: Payer: Self-pay | Admitting: Family Medicine

## 2019-11-04 NOTE — Telephone Encounter (Signed)
Pt want a note for work for when she talk to dr.koberlein on 11/29/19 sent to mychart.

## 2019-11-04 NOTE — Telephone Encounter (Signed)
Assume this meant to say 5/28 when she had taken extra bp medication. Letter created in mychart that she should be able to access. If more needed let me know.

## 2019-11-04 NOTE — Telephone Encounter (Signed)
I left a detailed message with the information below at the pts cell number. 

## 2019-12-20 ENCOUNTER — Ambulatory Visit: Payer: BC Managed Care – PPO | Admitting: Family Medicine

## 2019-12-24 ENCOUNTER — Encounter: Payer: Self-pay | Admitting: Family Medicine

## 2019-12-24 ENCOUNTER — Other Ambulatory Visit: Payer: Self-pay

## 2019-12-24 ENCOUNTER — Ambulatory Visit (INDEPENDENT_AMBULATORY_CARE_PROVIDER_SITE_OTHER): Payer: BC Managed Care – PPO | Admitting: Family Medicine

## 2019-12-24 VITALS — BP 142/84 | HR 80 | Temp 98.1°F | Ht 70.0 in | Wt 361.6 lb

## 2019-12-24 DIAGNOSIS — E1169 Type 2 diabetes mellitus with other specified complication: Secondary | ICD-10-CM | POA: Diagnosis not present

## 2019-12-24 DIAGNOSIS — E1159 Type 2 diabetes mellitus with other circulatory complications: Secondary | ICD-10-CM

## 2019-12-24 DIAGNOSIS — I152 Hypertension secondary to endocrine disorders: Secondary | ICD-10-CM

## 2019-12-24 DIAGNOSIS — L918 Other hypertrophic disorders of the skin: Secondary | ICD-10-CM | POA: Diagnosis not present

## 2019-12-24 DIAGNOSIS — I10 Essential (primary) hypertension: Secondary | ICD-10-CM

## 2019-12-24 DIAGNOSIS — E785 Hyperlipidemia, unspecified: Secondary | ICD-10-CM

## 2019-12-24 NOTE — Patient Instructions (Signed)
Count calories - work for 1200 daily average. If doing more than that cut back and try sticking with this for a month.

## 2019-12-24 NOTE — Progress Notes (Signed)
Jenna Stokes DOB: 11/26/1965 Encounter date: 12/24/2019  This is a 54 y.o. female who presents with Chief Complaint  Patient presents with  . Skin Tag    History of present illness: Was doing water, veggies and has a little energy but frustrated with lack of weight loss. She has really cut out unhealthy foods, fast foods, maxie b's, and has been pushing water. One day a week she eats what she wants at aunts house. Walking half mile twice daily. 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   Multiple skin tags that get stuck on hair and clothing. They are irritating to her.    Allergies  Allergen Reactions  . Penicillins Other (See Comments)    Unknown childhood reaction Has patient had a PCN reaction causing immediate rash, facial/tongue/throat swelling, SOB or lightheadedness with hypotension: Unknown Has patient had a PCN reaction causing severe rash involving mucus membranes or skin necrosis: Unknown Has patient had a PCN reaction that required hospitalization: Unknown Has patient had a PCN reaction occurring within the last 10 years: No If all of the above answers are "NO", then may proceed with Cephalosporin use.    Current Meds  Medication Sig  . blood glucose meter kit and supplies KIT Dispense based on patient and insurance preference. Use up to four times daily as directed. (FOR ICD-9 250.00, 250.01).  . metFORMIN (GLUCOPHAGE-XR) 500 MG 24 hr tablet Take 2 tablets (1,000 mg total) by mouth daily with breakfast.  . Olmesartan-amLODIPine-HCTZ 40-10-25 MG TABS Take 1 tablet by mouth daily.  Marland Kitchen spironolactone (ALDACTONE) 100 MG tablet Take 1 tablet (100 mg total) by mouth daily.  . [DISCONTINUED] albuterol (VENTOLIN HFA) 108 (90 Base) MCG/ACT inhaler Inhale 2 puffs into the lungs every 6 (six) hours as needed for shortness of breath.  . [DISCONTINUED] Semaglutide (RYBELSUS) 3 MG TABS Take 3 mg by mouth daily.    Review of Systems  Constitutional: Negative for  chills, fatigue and fever.  Respiratory: Negative for cough, chest tightness, shortness of breath and wheezing.   Cardiovascular: Negative for chest pain, palpitations and leg swelling.  Musculoskeletal: Positive for arthralgias (r knee).    Objective:  BP (!) 142/84   Pulse 80   Temp 98.1 F (36.7 C) (Oral)   Ht 5' 10"  (1.778 m)   Wt (!) 361 lb 9.6 oz (164 kg)   LMP 08/10/2006   BMI 51.88 kg/m   Weight: (!) 361 lb 9.6 oz (164 kg)   BP Readings from Last 3 Encounters:  12/24/19 (!) 142/84  09/15/19 (!) 160/82  06/17/19 (!) 148/90   Wt Readings from Last 3 Encounters:  12/24/19 (!) 361 lb 9.6 oz (164 kg)  09/15/19 (!) 362 lb 8 oz (164.4 kg)  06/16/19 (!) 370 lb 4.8 oz (168 kg)    Physical Exam Constitutional:      General: She is not in acute distress.    Appearance: She is well-developed. She is morbidly obese.  Cardiovascular:     Rate and Rhythm: Normal rate and regular rhythm.     Heart sounds: Normal heart sounds. No murmur heard.  No friction rub.  Pulmonary:     Effort: Pulmonary effort is normal. No respiratory distress.     Breath sounds: Normal breath sounds. No wheezing or rales.  Musculoskeletal:     Right lower leg: No edema.     Left lower leg: No edema.  Skin:    Comments: Multiple skin tags over neck, axilla, groin  Neurological:     Mental Status: She is alert and oriented to person, place, and time.  Psychiatric:        Behavior: Behavior normal.   Skin tag excision procedure Note  Pre-operative Diagnosis: Skin tags  Post-operative Diagnosis: same  Locations: 3 skin tags right axilla 0.25 cm, 3 skin tags left groin 0.25 cm, 1 skin tag left posterior arm 0.25 cm, 3 skin tags right neck 0.25 cm  Anesthesia: Lidocaine 1% with epinephrine   Procedure Details  Patient informed of the risks, including bleeding and infection, and benefits of the  procedure and Verbal informed consent obtained. The lesion and surrounding area was given a sterile  prep using chloraprep and draped in the usual sterile fashion. The skin lesion was shaved superficially using a dermablade.  Hemostasis of biopsy site was achieved using aluminum chloride. Sterile dressing applied. The specimen was sent for pathologic examination. The patient tolerated the procedure well.  Complications: none.  Plan: 1. Instructed to keep the wound dry and covered for 24 hrs and clean thereafter with soap and water.  But no chlorine hot tubs or pool exposure to surgical site.  2. Warning signs of infection were reviewed.   3. Recommended that the patient use OTC analgesics as needed for pain.    Assessment/Plan  1. Hypertension associated with diabetes ([place]) improved control. Patient's been checking at home and numbers have been stable. Continue current medications. She is frustrated with lack of weight loss. I have asked her to track her calories for 3 days and let me know how caloric intake is doing. I suspect that she is eating more of a maintenance caloric amount rather than a deficient caloric amount. If she is doing 1200 or less calories daily, she is agreeable to seeing bariatrics. If she is doing over 1500 cal daily, I have asked her to try to cut back for 1 month's time and see if she can make progress with weight loss. We did discuss importance of daily exercise. Keep up the good work with cutting back on unhealthy foods or unnecessary carbohydrates. - CBC with Differential/Platelet; Future  2. Hyperlipidemia associated with type 2 diabetes mellitus ([place]) she did not yet start the Rybelsus, but is going to start when she gets home. We discussed benefits of weight loss with this medication. - Hemoglobin A1c; Future - Comprehensive metabolic panel; Future - Lipid panel; Future - Microalbumin / creatinine urine ratio; Future  3. Skin tags, multiple acquired patient tolerated procedure well today. - PR EXC SKIN BENIG <0.5 CM TRUNK,ARM,LEG  Return in about 1 month  (around 01/24/2020).      Micheline Rough, MD

## 2019-12-31 ENCOUNTER — Other Ambulatory Visit: Payer: BC Managed Care – PPO

## 2020-01-11 ENCOUNTER — Telehealth: Payer: Self-pay | Admitting: Family Medicine

## 2020-01-11 NOTE — Telephone Encounter (Signed)
Pt left medication in the hot car and now when she takes them everything goes straight through her.  metFORMIN (GLUCOPHAGE-XR) 500 MG 24 hr tablet   Olmesartan-amLODIPine-HCTZ 40-10-25 MG TABS   spironolactone (ALDACTONE) 100 MG tablet   She wants to know what she should do?  Please call her at [phone]  Please advise

## 2020-01-12 ENCOUNTER — Telehealth: Payer: Self-pay | Admitting: Family Medicine

## 2020-01-12 NOTE — Telephone Encounter (Signed)
Spoke with the pt and attempted to inform her of the message below.  Patient asked that Dr Hassan Rowan call her as she talked with the pharmacy already and they told her its too early as she is not due for a refill until 19 days and is she to go without medications until this date?  Message sent to PCP.

## 2020-01-12 NOTE — Telephone Encounter (Signed)
Pt was returning a call.  

## 2020-01-12 NOTE — Telephone Encounter (Signed)
We can try to re-order (only issue might be that insurance doesn't cover if too early, but we could try)? That's what I would recommend.   Also I wanted to check back in with her to see how she was doing with calorie counting - how that went for her? I know she was frustrated with healthy eating and not losing weight, so just wanted to see how this went and if she felt she needed more help from diet standpoint.

## 2020-01-12 NOTE — Telephone Encounter (Signed)
Left a message for the pt to return my call.  

## 2020-01-12 NOTE — Telephone Encounter (Signed)
See prior phone note. 

## 2020-01-13 ENCOUNTER — Encounter: Payer: Self-pay | Admitting: Family Medicine

## 2020-01-13 NOTE — Telephone Encounter (Signed)
I sent her a Clinical cytogeneticist message. I think printed rx would be the best/cheapest option and have her cash pay without insurance. [employer] seems cheapest. I asked her to let me know if she would like me to get these signed/printed in the office tomorrow for her to pick up.

## 2020-03-13 ENCOUNTER — Other Ambulatory Visit: Payer: Self-pay | Admitting: Endocrinology

## 2020-03-13 DIAGNOSIS — E0590 Thyrotoxicosis, unspecified without thyrotoxic crisis or storm: Secondary | ICD-10-CM

## 2020-04-03 ENCOUNTER — Other Ambulatory Visit: Payer: Self-pay

## 2020-04-03 ENCOUNTER — Encounter (HOSPITAL_COMMUNITY): Payer: Self-pay | Admitting: *Deleted

## 2020-04-03 ENCOUNTER — Ambulatory Visit (HOSPITAL_COMMUNITY)
Admission: EM | Admit: 2020-04-03 | Discharge: 2020-04-03 | Disposition: A | Discharge status: Home / Self Care | Payer: BC Managed Care – PPO | Attending: Family Medicine | Admitting: Family Medicine

## 2020-04-03 DIAGNOSIS — M79642 Pain in left hand: Secondary | ICD-10-CM

## 2020-04-03 MED ORDER — PREDNISONE 10 MG (21) PO TBPK: ORAL_TABLET | ORAL | 0 refills | Status: DC

## 2020-04-03 NOTE — ED Triage Notes (Signed)
Pt reports Pain to Lt hand and Lt wrist. Pt does not report any injuries.

## 2020-04-03 NOTE — Discharge Instructions (Addendum)
Medicine as prescribed Rest, ice the area and wear the splint.  Follow up as needed for continued or worsening symptoms

## 2020-04-03 NOTE — ED Provider Notes (Signed)
[facility]    CSN: [id number] Arrival date & time: 04/03/20  6811      History   Chief Complaint Chief Complaint  Patient presents with  . Hand Pain  . Wrist Pain    HPI Jenna Stokes is a 54 y.o. female.   Patient is a 54 year old female presents today with left hand and wrist pain.  There is associated erythema and swelling.  Denies any specific injuries.  Reporting that she does repetitive movements opening boxes and sharing things at work daily.  Started for the past couple days worsening yesterday.  Has not taken any oral medication for symptoms.  Has been using rubs and sprays.  Denies any specific history of gout.  Denies any fever.     Past Medical History:  Diagnosis Date  . DM type 2, uncontrolled, with renal complications ([place]) 5/72/6203  . Hyperlipidemia   . Hypertension   . Morbid obesity [facility])     Patient Active Problem List   Diagnosis Date Noted  . Hypertensive emergency 01/08/2018  . Lung nodule 01/08/2018  . Obstructive sleep apnea 03/20/2017  . Hyperlipidemia associated with type 2 diabetes mellitus ([place]) 02/18/2017  . Type 2 diabetes mellitus without complication, without long-term current use of insulin ([place]) 04/12/2016  . Hypertension associated with diabetes ([place]) 08/19/2013  . Morbid obesity ([place])     Past Surgical History:  Procedure Laterality Date  . ABDOMINAL HYSTERECTOMY    . CESAREAN SECTION    . TONSILLECTOMY      OB History   No obstetric history on file.      Home Medications    Prior to Admission medications   Medication Sig Start Date End Date Taking? Authorizing Provider  blood glucose meter kit and supplies KIT Dispense based on patient and insurance preference. Use up to four times daily as directed. (FOR ICD-9 250.00, 250.01). 06/16/19  Yes Koberlein, Junell C, MD  Olmesartan-amLODIPine-HCTZ 40-10-25 MG TABS Take 1 tablet by mouth daily. 09/15/19  Yes Koberlein, Steele Berg, MD  spironolactone (ALDACTONE)  100 MG tablet Take 1 tablet (100 mg total) by mouth daily. 09/15/19  Yes Koberlein, Steele Berg, MD  metFORMIN (GLUCOPHAGE-XR) 500 MG 24 hr tablet Take 2 tablets (1,000 mg total) by mouth daily with breakfast. 10/27/19   Koberlein, Steele Berg, MD  predniSONE (STERAPRED UNI-PAK 21 TAB) 10 MG (21) TBPK tablet 6 tabs for 1 day, then 5 tabs for 1 das, then 4 tabs for 1 day, then 3 tabs for 1 day, 2 tabs for 1 day, then 1 tab for 1 day 04/03/20   Orvan July, NP    Family History Family History  Problem Relation Age of Onset  . Heart disease Mother   . Diabetes Mother   . Hypertension Sister   . Diabetes Sister   . Adrenal disorder Sister     Social History Social History   Tobacco Use  . Smoking status: Never Smoker  . Smokeless tobacco: Never Used  Substance Use Topics  . Alcohol use: No  . Drug use: No     Allergies   Penicillins   Review of Systems Review of Systems   Physical Exam Triage Vital Signs ED Triage Vitals  Enc Vitals Group     BP --      Pulse --      Resp --      Temp --      Temp src --      SpO2 --  Weight 04/03/20 1136 (!) 326 lb (147.9 kg)     Height 04/03/20 1136 _0  (1.753 m)     Head Circumference --      Peak Flow --      Pain Score 04/03/20 1135 8     Pain Loc --      Pain Edu? --      Excl. in [place]? --    No data found.  Updated Vital Signs BP (!) 251/115 (BP Location: Right Arm)   Pulse 99   Temp 98.7 F (37.1 C) (Oral)   Resp 16   Ht _1  (1.753 m)   Wt (!) 326 lb (147.9 kg)   LMP 08/10/2006   SpO2 100%   BMI 48.14 kg/m   Visual Acuity Right Eye Distance:   Left Eye Distance:   Bilateral Distance:    Right Eye Near:   Left Eye Near:    Bilateral Near:     Physical Exam Vitals and nursing note reviewed.  Constitutional:      General: She is not in acute distress.    Appearance: Normal appearance. She is not ill-appearing, toxic-appearing or diaphoretic.  HENT:     Head: Normocephalic.     Nose: Nose normal.    Eyes:     Conjunctiva/sclera: Conjunctivae normal.  Pulmonary:     Effort: Pulmonary effort is normal.  Musculoskeletal:        General: Normal range of motion.       Hands:     Cervical back: Normal range of motion.     Comments: Very TTP, erythema and swelling.  Limited ROM  Unable to fully examine due to the pain.   Skin:    General: Skin is warm and dry.     Findings: No rash.  Neurological:     Mental Status: She is alert.  Psychiatric:        Mood and Affect: Mood normal.      UC Treatments / Results  Labs (all labs ordered are listed, but only abnormal results are displayed) Labs Reviewed - No data to display  EKG   Radiology No results found.  Procedures Procedures (including critical care time)  Medications Ordered in UC Medications - No data to display  Initial Impression / Assessment and Plan / UC Course  I have reviewed the triage vital signs and the nursing notes.  Pertinent labs & imaging results that were available during my care of the patient were reviewed by me and considered in my medical decision making (see chart for details).     Hand pain Gouty arthritis versus tendinitis Treating with prednisone taper over the next 6 days. Splint applied here in clinic. Rest, ice, elevate. Follow up as needed for continued or worsening symptoms  Final Clinical Impressions(s) / UC Diagnoses   Final diagnoses:  Hand pain, left     Discharge Instructions     Medicine as prescribed Rest, ice the area and wear the splint.  Follow up as needed for continued or worsening symptoms     ED Prescriptions    Medication Sig Dispense Auth. Provider   predniSONE (STERAPRED UNI-PAK 21 TAB) 10 MG (21) TBPK tablet 6 tabs for 1 day, then 5 tabs for 1 das, then 4 tabs for 1 day, then 3 tabs for 1 day, 2 tabs for 1 day, then 1 tab for 1 day 21 tablet Johnnie Goynes A, NP     PDMP not reviewed this encounter.   Rozanna Box,  Ramsey Guadamuz A, NP 04/04/20 4827

## 2020-04-19 ENCOUNTER — Other Ambulatory Visit: Payer: Self-pay | Admitting: Family Medicine

## 2020-06-20 ENCOUNTER — Other Ambulatory Visit: Payer: BC Managed Care – PPO

## 2020-08-07 ENCOUNTER — Encounter (INDEPENDENT_AMBULATORY_CARE_PROVIDER_SITE_OTHER): Payer: Self-pay

## 2020-09-28 ENCOUNTER — Other Ambulatory Visit: Payer: Self-pay | Admitting: Family Medicine

## 2020-09-30 ENCOUNTER — Telehealth (INDEPENDENT_AMBULATORY_CARE_PROVIDER_SITE_OTHER): Payer: BC Managed Care – PPO | Admitting: Family Medicine

## 2020-09-30 ENCOUNTER — Encounter: Payer: Self-pay | Admitting: Family Medicine

## 2020-09-30 VITALS — Ht 69.0 in | Wt 351.0 lb

## 2020-09-30 DIAGNOSIS — I152 Hypertension secondary to endocrine disorders: Secondary | ICD-10-CM

## 2020-09-30 DIAGNOSIS — E1159 Type 2 diabetes mellitus with other circulatory complications: Secondary | ICD-10-CM | POA: Diagnosis not present

## 2020-09-30 DIAGNOSIS — E118 Type 2 diabetes mellitus with unspecified complications: Secondary | ICD-10-CM

## 2020-09-30 DIAGNOSIS — R600 Localized edema: Secondary | ICD-10-CM | POA: Diagnosis not present

## 2020-09-30 DIAGNOSIS — E1165 Type 2 diabetes mellitus with hyperglycemia: Secondary | ICD-10-CM

## 2020-09-30 DIAGNOSIS — IMO0002 Reserved for concepts with insufficient information to code with codable children: Secondary | ICD-10-CM

## 2020-09-30 NOTE — Assessment & Plan Note (Signed)
Only on glipizide 5mg  bid, followed by endo.

## 2020-09-30 NOTE — Assessment & Plan Note (Addendum)
Good control at latest endo appt, however no BP cuff to monitor at home.  Recommend she buy home BP cuff.

## 2020-09-30 NOTE — Assessment & Plan Note (Addendum)
Anticipate multifactorial etiology including missed antihypertensive doses as well as morbid obesity and recent prolonged car ride.  Not consistent with cellulitis or DVT - discussed monitoring for persistent or worsening unilateral leg swelling.  Reviewed limiting salt/sodium in diet, increasing water intake, leg elevation.  Reviewed not doubling up on antihypertensives.  Update if not improving back on regular antihypertensive regimen - rec f/u in office if this happens for further eval including labwork. Pt agrees with plan.

## 2020-09-30 NOTE — Progress Notes (Signed)
Patient ID: Jenna Stokes, female    DOB: 12-Nov-1965, 54 y.o.   MRN: [id number]  Virtual visit completed through [employer], a video enabled telemedicine application. Due to national recommendations of social distancing due to COVID-19, a virtual visit is felt to be most appropriate for this patient at this time. Reviewed limitations, risks, security and privacy concerns of performing a virtual visit and the availability of in person appointments. I also reviewed that there may be a patient responsible charge related to this service. The patient agreed to proceed.   Patient location: home Provider location: [place] at [facility], office Persons participating in this virtual visit: patient, provider   If any vitals were documented, they were collected by patient at home unless specified below.    Ht 5' 9"  (1.753 m)   Wt (!) 351 lb (159.2 kg)   LMP 08/10/2006   BMI 51.83 kg/m   BP Readings from Last 3 Encounters:  04/03/20 (!) 251/115  12/24/19 (!) 142/84  09/15/19 (!) 160/82    CC: leg/foot swelling Subjective:   HPI: Jenna Stokes is a 55 y.o. female presenting on 09/30/2020 for Leg Swelling (B/l leg and feet swelling x 1 week. Has started back on mediation with no improvement. )   Last saw PCP 12/2019, overdue for f/u visit.   Known diabetic, HTN. Doesn't check BP, sugars running 140-180 after meals, fasting 130s. Sees endo Dr Chalmers Cater - recent A1c 9.1% (~1.5 months ago - BP at that time 136/70) - has f/u endo planned for 12/2020.    Last week on spring break - left meds at home, missed 4-5 days. Saturday night restarted meds, took double dose of meds for 2-3 days. Noted leg swelling that started on Sunday after return. This morning leg swelling has improved but persists, L>R feet/legs. Dependent edema worse at night, better first thing in the morning.   Long car ride to the beach for spring break.  No recent increase in salt intake.   Current regimen is  olmesartan/amlodipine/hctz 40/10/25 daily + spironolactone 163m daily.  Also taking glipizide 597mbid - takes PRN with larger meals.  Metformin causes diarrhea.      Relevant past medical, surgical, family and social history reviewed and updated as indicated. Interim medical history since our last visit reviewed. Allergies and medications reviewed and updated. Outpatient Medications Prior to Visit  Medication Sig Dispense Refill  . blood glucose meter kit and supplies KIT Dispense based on patient and insurance preference. Use up to four times daily as directed. (FOR ICD-9 250.00, 250.01). 1 each 0  . Olmesartan-amLODIPine-HCTZ 40-10-25 MG TABS TAKE ONE TABLET BY MOUTH DAILY 30 tablet 0  . spironolactone (ALDACTONE) 100 MG tablet TAKE ONE TABLET BY MOUTH DAILY 90 tablet 0  . glipiZIDE (GLUCOTROL) 5 MG tablet Take 1 tablet (5 mg total) by mouth 2 (two) times daily before a meal.    . metFORMIN (GLUCOPHAGE-XR) 500 MG 24 hr tablet Take 2 tablets (1,000 mg total) by mouth daily with breakfast. 180 tablet 1  . predniSONE (STERAPRED UNI-PAK 21 TAB) 10 MG (21) TBPK tablet 6 tabs for 1 day, then 5 tabs for 1 das, then 4 tabs for 1 day, then 3 tabs for 1 day, 2 tabs for 1 day, then 1 tab for 1 day 21 tablet 0   No facility-administered medications prior to visit.     Per HPI unless specifically indicated in ROS section below Review of Systems Objective:  Ht 5' 9"  (  1.753 m)   Wt (!) 351 lb (159.2 kg)   LMP 08/10/2006   BMI 51.83 kg/m   Wt Readings from Last 3 Encounters:  09/30/20 (!) 351 lb (159.2 kg)  04/03/20 (!) 326 lb (147.9 kg)  12/24/19 (!) 361 lb 9.6 oz (164 kg)       Physical exam: Gen: alert, NAD, not ill appearing Pulm: speaks in complete sentences without increased work of breathing Psych: normal mood, normal thought content  Extr: L>R pedal edema lower legs into dorsal feet    Lab Results  Component Value Date   CREATININE 1.12 09/15/2019   BUN 21 09/15/2019   NA 135  09/15/2019   K 4.4 09/15/2019   CL 103 09/15/2019   CO2 24 09/15/2019    Lab Results  Component Value Date   HGBA1C 7.8 (H) 09/15/2019    Lab Results  Component Value Date   CHOL 219 (H) 06/09/2019   HDL 41.70 06/09/2019   LDLCALC 88 01/09/2018   LDLDIRECT 121.0 06/09/2019   TRIG 300.0 (H) 06/09/2019   CHOLHDL 5 06/09/2019    Assessment & Plan:   Problem List Items Addressed This Visit    Hypertension associated with diabetes ([place])    Good control at latest endo appt, however no BP cuff to monitor at home.  Recommend she buy home BP cuff.       Relevant Medications   glipiZIDE (GLUCOTROL) 5 MG tablet   Diabetes mellitus type 2, uncontrolled, with complications (H[facility])    Only on glipizide 2m bid, followed by endo.       Relevant Medications   glipiZIDE (GLUCOTROL) 5 MG tablet   Pedal edema - Primary    Anticipate multifactorial etiology including missed antihypertensive doses as well as morbid obesity and recent prolonged car ride.  Not consistent with cellulitis or DVT - discussed monitoring for persistent or worsening unilateral leg swelling.  Reviewed limiting salt/sodium in diet, increasing water intake, leg elevation.  Reviewed not doubling up on antihypertensives.  Update if not improving back on regular antihypertensive regimen - rec f/u in office if this happens for further eval including labwork. Pt agrees with plan.           No orders of the defined types were placed in this encounter.  No orders of the defined types were placed in this encounter.   I discussed the assessment and treatment plan with the patient. The patient was provided an opportunity to ask questions and all were answered. The patient agreed with the plan and demonstrated an understanding of the instructions. The patient was advised to call back or seek an in-person evaluation if the symptoms worsen or if the condition fails to improve as anticipated.  Follow up plan: No follow-ups on  file.  JRia Bush MD

## 2020-10-02 ENCOUNTER — Telehealth: Payer: Self-pay | Admitting: Family Medicine

## 2020-10-02 NOTE — Telephone Encounter (Signed)
Pt is calling in stating that she needs to have an appointment any of the 3 days at 12:00 due to her having her lunch at that time and would like for the provider to call her to make previsions to do so.  Pt was offered several different times and declined them one being 12:30 on a 11/20/2020.  Pt would like to have a call back.

## 2020-10-04 NOTE — Telephone Encounter (Signed)
Since it would be best to have an in-person appointment - really the only day we can do a 12:00 is on Monday, because I do not have staff to room her on my other days in the office since they are required to have a lunch break.   *alternatively; I could order her bloodwork and we could do a virtual visit on a weds or Friday at noon and review results during visit.

## 2020-10-06 NOTE — Telephone Encounter (Signed)
Left a message for the pt to return my call.  

## 2020-10-06 NOTE — Telephone Encounter (Signed)
Patient is returning the call, please advise. CB is [phone]

## 2020-10-10 NOTE — Telephone Encounter (Signed)
Spoke with the pt and informed her of the message below.  Patient stated this will not work for her work schedule, she will have to find another doctor, said "goodbye" and I stated to have a good day and hung up.

## 2020-10-28 ENCOUNTER — Other Ambulatory Visit: Payer: Self-pay | Admitting: Family Medicine

## 2020-10-30 NOTE — Telephone Encounter (Signed)
Per JoAnne's last note; patient stated she would need to find another provider since she could not schedule appointments over her lunch hour at work. I went ahead and gave her 3 month supply so that she does not run out of medication; but if she is not going to be seeing me I cannot keep refilling her medications. I don't want her to run out, so just wanted to make her aware/see what you could offer that may work for her.

## 2020-12-16 ENCOUNTER — Other Ambulatory Visit: Payer: Self-pay

## 2020-12-16 ENCOUNTER — Inpatient Hospital Stay (HOSPITAL_BASED_OUTPATIENT_CLINIC_OR_DEPARTMENT_OTHER)
Admission: EM | Admit: 2020-12-16 | Discharge: 2020-12-23 | DRG: 854 | Disposition: A | Discharge status: Home Health | Payer: BC Managed Care – PPO | Attending: Internal Medicine | Admitting: Internal Medicine

## 2020-12-16 ENCOUNTER — Encounter (HOSPITAL_BASED_OUTPATIENT_CLINIC_OR_DEPARTMENT_OTHER): Payer: Self-pay | Admitting: Emergency Medicine

## 2020-12-16 ENCOUNTER — Emergency Department (HOSPITAL_BASED_OUTPATIENT_CLINIC_OR_DEPARTMENT_OTHER): Payer: BC Managed Care – PPO

## 2020-12-16 DIAGNOSIS — G4733 Obstructive sleep apnea (adult) (pediatric): Secondary | ICD-10-CM | POA: Diagnosis present

## 2020-12-16 DIAGNOSIS — Z79899 Other long term (current) drug therapy: Secondary | ICD-10-CM

## 2020-12-16 DIAGNOSIS — A419 Sepsis, unspecified organism: Secondary | ICD-10-CM | POA: Diagnosis present

## 2020-12-16 DIAGNOSIS — L0390 Cellulitis, unspecified: Secondary | ICD-10-CM | POA: Diagnosis not present

## 2020-12-16 DIAGNOSIS — Z833 Family history of diabetes mellitus: Secondary | ICD-10-CM

## 2020-12-16 DIAGNOSIS — E785 Hyperlipidemia, unspecified: Secondary | ICD-10-CM | POA: Diagnosis present

## 2020-12-16 DIAGNOSIS — Z888 Allergy status to other drugs, medicaments and biological substances status: Secondary | ICD-10-CM

## 2020-12-16 DIAGNOSIS — Z20822 Contact with and (suspected) exposure to covid-19: Secondary | ICD-10-CM | POA: Diagnosis present

## 2020-12-16 DIAGNOSIS — Z7984 Long term (current) use of oral hypoglycemic drugs: Secondary | ICD-10-CM

## 2020-12-16 DIAGNOSIS — Z8249 Family history of ischemic heart disease and other diseases of the circulatory system: Secondary | ICD-10-CM

## 2020-12-16 DIAGNOSIS — D649 Anemia, unspecified: Secondary | ICD-10-CM | POA: Diagnosis present

## 2020-12-16 DIAGNOSIS — Z880 Allergy status to penicillin: Secondary | ICD-10-CM

## 2020-12-16 DIAGNOSIS — E1165 Type 2 diabetes mellitus with hyperglycemia: Secondary | ICD-10-CM

## 2020-12-16 DIAGNOSIS — IMO0002 Reserved for concepts with insufficient information to code with codable children: Secondary | ICD-10-CM

## 2020-12-16 DIAGNOSIS — Z90710 Acquired absence of both cervix and uterus: Secondary | ICD-10-CM

## 2020-12-16 DIAGNOSIS — N179 Acute kidney failure, unspecified: Secondary | ICD-10-CM

## 2020-12-16 DIAGNOSIS — Z6841 Body Mass Index (BMI) 40.0 and over, adult: Secondary | ICD-10-CM

## 2020-12-16 DIAGNOSIS — N764 Abscess of vulva: Secondary | ICD-10-CM | POA: Diagnosis present

## 2020-12-16 DIAGNOSIS — I10 Essential (primary) hypertension: Secondary | ICD-10-CM | POA: Diagnosis present

## 2020-12-16 DIAGNOSIS — L03315 Cellulitis of perineum: Secondary | ICD-10-CM | POA: Diagnosis present

## 2020-12-16 HISTORY — DX: Acute kidney failure, unspecified: N17.9

## 2020-12-16 LAB — CBC WITH DIFFERENTIAL/PLATELET
Abs Immature Granulocytes: 0.11 10*3/uL — ABNORMAL HIGH (ref 0.00–0.07)
Basophils Absolute: 0.1 10*3/uL (ref 0.0–0.1)
Basophils Relative: 0 %
Eosinophils Absolute: 0 10*3/uL (ref 0.0–0.5)
Eosinophils Relative: 0 %
HCT: 43.3 % (ref 36.0–46.0)
Hemoglobin: 14 g/dL (ref 12.0–15.0)
Immature Granulocytes: 1 %
Lymphocytes Relative: 7 %
Lymphs Abs: 1.5 10*3/uL (ref 0.7–4.0)
MCH: 29.9 pg (ref 26.0–34.0)
MCHC: 32.3 g/dL (ref 30.0–36.0)
MCV: 92.5 fL (ref 80.0–100.0)
Monocytes Absolute: 2.1 10*3/uL — ABNORMAL HIGH (ref 0.1–1.0)
Monocytes Relative: 10 %
Neutro Abs: 16.6 10*3/uL — ABNORMAL HIGH (ref 1.7–7.7)
Neutrophils Relative %: 82 %
Platelets: 180 10*3/uL (ref 150–400)
RBC: 4.68 MIL/uL (ref 3.87–5.11)
RDW: 12.4 % (ref 11.5–15.5)
WBC: 20.4 10*3/uL — ABNORMAL HIGH (ref 4.0–10.5)
nRBC: 0 % (ref 0.0–0.2)

## 2020-12-16 LAB — URINALYSIS, DIPSTICK ONLY
Bilirubin Urine: NEGATIVE
Glucose, UA: NEGATIVE mg/dL
Hgb urine dipstick: NEGATIVE
Ketones, ur: NEGATIVE mg/dL
Leukocytes,Ua: NEGATIVE
Nitrite: NEGATIVE
Protein, ur: 30 mg/dL — AB
Specific Gravity, Urine: 1.02 (ref 1.005–1.030)
pH: 5.5 (ref 5.0–8.0)

## 2020-12-16 LAB — TROPONIN I (HIGH SENSITIVITY)
Troponin I (High Sensitivity): 32 ng/L — ABNORMAL HIGH (ref <18)
Troponin I (High Sensitivity): 29 ng/L — ABNORMAL HIGH (ref <18)

## 2020-12-16 LAB — RESP PANEL BY RT-PCR (FLU A&B, COVID) ARPGX2
Influenza A by PCR: NEGATIVE
Influenza B by PCR: NEGATIVE
SARS Coronavirus 2 by RT PCR: NEGATIVE

## 2020-12-16 LAB — BASIC METABOLIC PANEL
Anion gap: 10 (ref 5–15)
BUN: 30 mg/dL — ABNORMAL HIGH (ref 6–20)
CO2: 25 mmol/L (ref 22–32)
Calcium: 10.8 mg/dL — ABNORMAL HIGH (ref 8.9–10.3)
Chloride: 100 mmol/L (ref 98–111)
Creatinine, Ser: 1.75 mg/dL — ABNORMAL HIGH (ref 0.44–1.00)
GFR, Estimated: 34 mL/min — ABNORMAL LOW (ref >60)
Glucose, Bld: 191 mg/dL — ABNORMAL HIGH (ref 70–99)
Potassium: 5.1 mmol/L (ref 3.5–5.1)
Sodium: 135 mmol/L (ref 135–145)

## 2020-12-16 LAB — LIPASE, BLOOD: Lipase: 25 U/L (ref 11–51)

## 2020-12-16 MED ORDER — SODIUM CHLORIDE 0.9 % IV BOLUS
1000.0000 mL | Freq: Once | INTRAVENOUS | Status: AC
  Administered 2020-12-16: 1000 mL via INTRAVENOUS

## 2020-12-16 MED ORDER — VANCOMYCIN HCL IN DEXTROSE 1-5 GM/200ML-% IV SOLN
1000.0000 mg | INTRAVENOUS | Status: AC
  Administered 2020-12-16 (×2): 1000 mg via INTRAVENOUS
  Filled 2020-12-16 (×2): qty 200

## 2020-12-16 MED ORDER — VANCOMYCIN VARIABLE DOSE PER UNSTABLE RENAL FUNCTION (PHARMACIST DOSING)
Status: DC
  Filled 2020-12-16: qty 1

## 2020-12-16 MED ORDER — DOXYCYCLINE HYCLATE 100 MG PO TABS
100.0000 mg | ORAL_TABLET | Freq: Once | ORAL | Status: AC
  Administered 2020-12-16: 100 mg via ORAL
  Filled 2020-12-16: qty 1

## 2020-12-16 MED ORDER — SODIUM CHLORIDE 0.9 % IV SOLN
2.0000 g | Freq: Two times a day (BID) | INTRAVENOUS | Status: DC
  Administered 2020-12-17 – 2020-12-18 (×3): 2 g via INTRAVENOUS
  Filled 2020-12-16 (×3): qty 2

## 2020-12-16 MED ORDER — ACETAMINOPHEN 325 MG PO TABS
ORAL_TABLET | ORAL | Status: AC
  Filled 2020-12-16: qty 2

## 2020-12-16 MED ORDER — SODIUM CHLORIDE 0.9 % IV SOLN
2.0000 g | Freq: Once | INTRAVENOUS | Status: AC
  Administered 2020-12-16: 2 g via INTRAVENOUS
  Filled 2020-12-16: qty 2

## 2020-12-16 MED ORDER — ACETAMINOPHEN 325 MG PO TABS
650.0000 mg | ORAL_TABLET | Freq: Once | ORAL | Status: AC
  Administered 2020-12-17: 650 mg via ORAL

## 2020-12-16 NOTE — Progress Notes (Signed)
Pharmacy Antibiotic Note  Jenna Stokes is a 55 y.o. female admitted on 12/16/2020 with sepsis.  Pharmacy has been consulted for cefepime and vancomycin dosing.  Patient's serum creatinine is 1.75 which is significantly above baseline.  Plan: Cefepime 2g q12h Vancomycin 2000 mg once, subsequent dosing as indicated per random vancomycin level until renal function stable and/or improved, at which time scheduled dosing can be considered Follow-up cultures and de-escalate antibiotics as appropriate.     Temp (24hrs), Avg:98.3 F (36.8 C), Min:98.3 F (36.8 C), Max:98.3 F (36.8 C)  Recent Labs  Lab 12/16/20 1558  WBC 20.4*  CREATININE 1.75*    CrCl cannot be calculated (Unknown ideal weight.).    Allergies  Allergen Reactions   Penicillins Other (See Comments)    Unknown childhood reaction Has patient had a PCN reaction causing immediate rash, facial/tongue/throat swelling, SOB or lightheadedness with hypotension: Unknown Has patient had a PCN reaction causing severe rash involving mucus membranes or skin necrosis: Unknown Has patient had a PCN reaction that required hospitalization: Unknown Has patient had a PCN reaction occurring within the last 10 years: No If all of the above answers are "NO", then may proceed with Cephalosporin use.    Dapagliflozin-Metformin Hcl Er Other (See Comments)   Metformin Hcl Other (See Comments)    Antimicrobials this admission: Vancomycin 7/16 >>  Cefepime 7/16 >>  Microbiology results: Pending  Thank you for allowing pharmacy to be a part of this patient's care.  Delmar Landau, PharmD, BCPS 12/16/2020 9:59 PM ED Clinical Pharmacist -  [phone]

## 2020-12-16 NOTE — ED Provider Notes (Signed)
[facility] EMERGENCY DEPT Provider Note   CSN: [id number] Arrival date & time: 12/16/20  1300     History Chief Complaint  Patient presents with   Generalized Body Aches    Jenna Stokes is a 55 y.o. female with history of obesity, type 2 diabetes, hypertension, hyperlipidemia, presenting to the ED with fatigue and chills.  The patient reports that she spent most of the day outside at a water park yesterday.  She said towards the end of the day she began to feel bad all over, with chills, felt very hot and flushed all over.  She was feeling lightheaded.  She thought she was overheated.  She went home and went to bed but this morning she woke up began feeling flushed, hot and overheated.  She denies any chest pain or pressure, does report that she has had a cough and some dyspnea.  She reports nausea and loss of appetite, denies vomiting or diarrhea.  She reports she has had 3 doses of the COVID-vaccine.  She denies sore throat, headache.  She does report myalgia.  She also reports a "boil" for the past 2 days in her inguinal region on the left side.  She has had in the past she has had small boils with drain.  She is trying to apply warm compresses to this.  She is on oral medications for diabetes only.  She reports compliance with her medications.  HPI     Past Medical History:  Diagnosis Date   DM type 2, uncontrolled, with renal complications ([place]) 7/61/9509   Hyperlipidemia    Hypertension    Morbid obesity ([place])     Patient Active Problem List   Diagnosis Date Noted   Sepsis ([place]) 12/17/2020   AKI (acute kidney injury) ([place]) 12/16/2020   Pedal edema 09/30/2020   Hypertensive emergency 01/08/2018   Lung nodule 01/08/2018   Obstructive sleep apnea 03/20/2017   Hyperlipidemia associated with type 2 diabetes mellitus ([place]) 02/18/2017   Diabetes mellitus type 2, uncontrolled, with complications ([place]) 32/67/1245   Hypertension associated with diabetes ([place])  08/19/2013   Morbid obesity ([place])     Past Surgical History:  Procedure Laterality Date   ABDOMINAL HYSTERECTOMY     CESAREAN SECTION     TONSILLECTOMY       OB History   No obstetric history on file.     Family History  Problem Relation Age of Onset   Heart disease Mother    Diabetes Mother    Hypertension Sister    Diabetes Sister    Adrenal disorder Sister     Social History   Tobacco Use   Smoking status: Never   Smokeless tobacco: Never  Substance Use Topics   Alcohol use: No   Drug use: No    Home Medications Prior to Admission medications   Medication Sig Start Date End Date Taking? Authorizing Provider  blood glucose meter kit and supplies KIT Dispense based on patient and insurance preference. Use up to four times daily as directed. (FOR ICD-9 250.00, 250.01). 06/16/19  Yes Koberlein, Junell C, MD  glimepiride (AMARYL) 1 MG tablet Take 1 mg by mouth 3 (three) times daily.   Yes [provider]  Olmesartan-amLODIPine-HCTZ 40-10-25 MG TABS TAKE ONE TABLET BY MOUTH DAILY 10/30/20  Yes Koberlein, Junell C, MD  spironolactone (ALDACTONE) 100 MG tablet TAKE ONE TABLET BY MOUTH DAILY 04/19/20  Yes Koberlein, Steele Berg, MD    Allergies    Penicillins, Dapagliflozin-metformin hcl  er, and Metformin hcl  Review of Systems   Review of Systems  Constitutional:  Positive for appetite change, chills, fatigue and fever.  Eyes:  Negative for pain and visual disturbance.  Respiratory:  Positive for shortness of breath. Negative for cough.   Cardiovascular:  Negative for chest pain and palpitations.  Gastrointestinal:  Positive for nausea. Negative for abdominal pain and vomiting.  Genitourinary:  Negative for dysuria and hematuria.  Musculoskeletal:  Positive for arthralgias and myalgias.  Skin:  Negative for color change and rash.  Neurological:  Positive for light-headedness. Negative for syncope and headaches.  All other systems reviewed and are  negative.  Physical Exam Updated Vital Signs BP (!) 119/57 (BP Location: Left Arm)   Pulse 93   Temp 99.1 F (37.3 C) (Oral)   Resp 18   Ht _0  (1.803 m)   Wt (!) 171.1 kg   LMP 08/10/2006   SpO2 98%   BMI 52.59 kg/m   Physical Exam Constitutional:      General: She is not in acute distress.    Appearance: She is obese.  HENT:     Head: Normocephalic and atraumatic.  Eyes:     Conjunctiva/sclera: Conjunctivae normal.     Pupils: Pupils are equal, round, and reactive to light.  Cardiovascular:     Rate and Rhythm: Normal rate and regular rhythm.  Pulmonary:     Effort: Pulmonary effort is normal. No respiratory distress.  Abdominal:     General: There is no distension.     Tenderness: There is no abdominal tenderness.  Skin:    General: Skin is warm and dry.     Comments: Small region of fluctuance, induration outside the left lower labia, no active drainage  Neurological:     General: No focal deficit present.     Mental Status: She is alert. Mental status is at baseline.  Psychiatric:        Mood and Affect: Mood normal.        Behavior: Behavior normal.    ED Results / Procedures / Treatments   Labs (all labs ordered are listed, but only abnormal results are displayed) Labs Reviewed  BASIC METABOLIC PANEL - Abnormal; Notable for the following components:      Result Value   Glucose, Bld 191 (*)    BUN 30 (*)    Creatinine, Ser 1.75 (*)    Calcium 10.8 (*)    GFR, Estimated 34 (*)    All other components within normal limits  CBC WITH DIFFERENTIAL/PLATELET - Abnormal; Notable for the following components:   WBC 20.4 (*)    Neutro Abs 16.6 (*)    Monocytes Absolute 2.1 (*)    Abs Immature Granulocytes 0.11 (*)    All other components within normal limits  URINALYSIS, DIPSTICK ONLY - Abnormal; Notable for the following components:   Protein, ur 30 (*)    All other components within normal limits  GLUCOSE, CAPILLARY - Abnormal; Notable for the  following components:   Glucose-Capillary 215 (*)    All other components within normal limits  COMPREHENSIVE METABOLIC PANEL - Abnormal; Notable for the following components:   Sodium 132 (*)    Glucose, Bld 286 (*)    BUN 30 (*)    Creatinine, Ser 1.79 (*)    AST 12 (*)    GFR, Estimated 33 (*)    All other components within normal limits  PHOSPHORUS - Abnormal; Notable for the following components:  Phosphorus 2.1 (*)    All other components within normal limits  CBC - Abnormal; Notable for the following components:   WBC 20.3 (*)    RBC 3.81 (*)    Hemoglobin 11.8 (*)    Platelets 144 (*)    All other components within normal limits  TROPONIN I (HIGH SENSITIVITY) - Abnormal; Notable for the following components:   Troponin I (High Sensitivity) 32 (*)    All other components within normal limits  TROPONIN I (HIGH SENSITIVITY) - Abnormal; Notable for the following components:   Troponin I (High Sensitivity) 29 (*)    All other components within normal limits  RESP PANEL BY RT-PCR (FLU A&B, COVID) ARPGX2  CULTURE, BLOOD (ROUTINE X 2)  CULTURE, BLOOD (ROUTINE X 2)  LIPASE, BLOOD  MAGNESIUM  PROTIME-INR    EKG EKG Interpretation  Date/Time:  Saturday December 16 2020 15:37:54 EDT Ventricular Rate:  96 PR Interval:  154 QRS Duration: 96 QT Interval:  345 QTC Calculation: 436 R Axis:   12 Text Interpretation: Sinus tachycardia Multiple ventricular premature complexes No STEMI Confirmed by Octaviano Glow [phone]) on 12/16/2020 4:07:55 PM  Radiology DG Chest Portable 1 View  Result Date: 12/16/2020 CLINICAL DATA:  Generalized body aches. EXAM: PORTABLE CHEST 1 VIEW COMPARISON:  01/08/2018. FINDINGS: Cardiac silhouette is normal in size. No mediastinal or hilar masses. No evidence of adenopathy. Clear lungs.  No convincing pleural effusion and no pneumothorax. Skeletal structures are grossly intact. IMPRESSION: No active disease. Electronically Signed   By: Lajean Manes M.D.    On: 12/16/2020 17:01    Procedures .Critical Care  Date/Time: 12/17/2020 11:35 AM Performed by: Wyvonnia Dusky, MD Authorized by: Wyvonnia Dusky, MD   Critical care provider statement:    Critical care time (minutes):  45   Critical care was necessary to treat or prevent imminent or life-threatening deterioration of the following conditions:  Sepsis   Critical care was time spent personally by me on the following activities:  Discussions with consultants, evaluation of patient's response to treatment, examination of patient, ordering and performing treatments and interventions, ordering and review of laboratory studies, ordering and review of radiographic studies, pulse oximetry, re-evaluation of patient's condition, obtaining history from patient or surrogate and review of old charts   Medications Ordered in ED Medications  ceFEPIme (MAXIPIME) 2 g in sodium chloride 0.9 % 100 mL IVPB (2 g Intravenous New Bag/Given 12/17/20 0923)  enoxaparin (LOVENOX) injection 80 mg (has no administration in time range)  lactated ringers infusion ( Intravenous New Bag/Given 12/17/20 1031)  acetaminophen (TYLENOL) tablet 650 mg (has no administration in time range)  senna (SENOKOT) tablet 8.6 mg (has no administration in time range)  polyethylene glycol (MIRALAX / GLYCOLAX) packet 17 g (has no administration in time range)  bisacodyl (DULCOLAX) suppository 10 mg (has no administration in time range)  magnesium citrate solution 1 Bottle (has no administration in time range)  ondansetron (ZOFRAN) tablet 4 mg (has no administration in time range)    Or  ondansetron (ZOFRAN) injection 4 mg (has no administration in time range)  insulin aspart (novoLOG) injection 0-9 Units (has no administration in time range)  insulin aspart (novoLOG) injection 0-5 Units (has no administration in time range)  traMADol (ULTRAM) tablet 50 mg (has no administration in time range)  oxyCODONE (Oxy IR/ROXICODONE) immediate  release tablet 5-10 mg (has no administration in time range)  vancomycin (VANCOREADY) IVPB 1500 mg/300 mL (has no administration in time range)  sodium  chloride 0.9 % bolus 1,000 mL (0 mLs Intravenous Stopped 12/16/20 1740)  doxycycline (VIBRA-TABS) tablet 100 mg (100 mg Oral Given 12/16/20 1556)  sodium chloride 0.9 % bolus 1,000 mL (0 mLs Intravenous Stopped 12/16/20 2328)  vancomycin (VANCOCIN) IVPB 1000 mg/200 mL premix (0 mg Intravenous Stopped 12/16/20 2328)  ceFEPIme (MAXIPIME) 2 g in sodium chloride 0.9 % 100 mL IVPB (0 g Intravenous Stopped 12/16/20 2111)  acetaminophen (TYLENOL) tablet 650 mg (650 mg Oral Given 12/17/20 0001)  acetaminophen (TYLENOL) 325 MG tablet (  Given 12/17/20 0005)    ED Course  I have reviewed the triage vital signs and the nursing notes.  Pertinent labs & imaging results that were available during my care of the patient were reviewed by me and considered in my medical decision making (see chart for details).  This patient complains of fever, fatigue, myalgias for 2 days.  This involves an extensive number of treatment options, and is a complaint that carries with it a high risk of complications and morbidity.  The differential diagnosis includes viral syndrome versus atypical ACS versus heat exhaustion versus dehydration versus anemia versus other  I ordered, reviewed, and interpreted labs.  Delta trop flat 32 -> 29 (in setting of AKI), less likely ACS.  WBC 20.4.  Cr elevated to 1.75.  UA without sign of ifnection I ordered medication BS antibiotics for sepsis coverage I ordered imaging studies which included dg chest I independently visualized and interpreted imaging which showed no life-threatening abnormalities, and the monitor tracing which showed NSR  Small region of fluctuance in her left inguinal region may be a developing abscess.  This time I think this is too small for drainage.  This also does not appear to be a labial abscess.  I discussed starting her  on a course of antibiotics as well as continued application of warm compresses to the region.  Patient initially given doxycycline in ED prior to completion of her workup and determination for admission for sepsis.  She denies vaginal discharge or concern for STI.  No pelvic pain to suggest PID.  Based on this clinical workup I do have concern for sepsis or bactermia with unclear source.  Pt developed a fever while in the ED.  I doubt severe sepsis or shock. BP has been stable.  Covid was negative.  Clinical Course as of 12/17/20 1135  Sat Dec 16, 2020  1944 Patient reassessed.  She continues to feel fatigued.  Her heart rate is now over 100 bpm.  Blood pressure has been stable.  It is unclear what is causing her symptoms, but there is concern for possible bacteremia/ sepsis with her significant leukocytosis.  She also appears to have an AKI with doubling of her creatinine.  I have ordered broad-spectrum antibiotics, blood cultures.  Discussed with the patient and her sister who are agreeable for observation and monitoring in the hospital. [MT]    Clinical Course User Index [MT] Sonji Starkes, Carola Rhine, MD     Final Clinical Impression(s) / ED Diagnoses Final diagnoses:  Sepsis, due to unspecified organism, unspecified whether acute organ dysfunction present [facility])    Rx / DC Orders ED Discharge Orders     None        Wyvonnia Dusky, MD 12/17/20 1137

## 2020-12-16 NOTE — ED Triage Notes (Signed)
[place]. Started feeling bad, chills, went home and tried to cool down, feels worse today. Pt also has a boil on her left thigh wants to be evaluated.

## 2020-12-16 NOTE — ED Notes (Signed)
Patient given cracker and orange juice per doctor okay

## 2020-12-17 ENCOUNTER — Other Ambulatory Visit: Payer: Self-pay

## 2020-12-17 ENCOUNTER — Inpatient Hospital Stay (HOSPITAL_COMMUNITY): Payer: BC Managed Care – PPO

## 2020-12-17 DIAGNOSIS — L03315 Cellulitis of perineum: Secondary | ICD-10-CM | POA: Diagnosis present

## 2020-12-17 DIAGNOSIS — E1169 Type 2 diabetes mellitus with other specified complication: Secondary | ICD-10-CM | POA: Diagnosis not present

## 2020-12-17 DIAGNOSIS — N764 Abscess of vulva: Secondary | ICD-10-CM | POA: Diagnosis present

## 2020-12-17 DIAGNOSIS — I10 Essential (primary) hypertension: Secondary | ICD-10-CM | POA: Diagnosis present

## 2020-12-17 DIAGNOSIS — Z79899 Other long term (current) drug therapy: Secondary | ICD-10-CM | POA: Diagnosis not present

## 2020-12-17 DIAGNOSIS — L0390 Cellulitis, unspecified: Secondary | ICD-10-CM | POA: Diagnosis present

## 2020-12-17 DIAGNOSIS — Z20822 Contact with and (suspected) exposure to covid-19: Secondary | ICD-10-CM | POA: Diagnosis present

## 2020-12-17 DIAGNOSIS — L03818 Cellulitis of other sites: Secondary | ICD-10-CM

## 2020-12-17 DIAGNOSIS — Z7984 Long term (current) use of oral hypoglycemic drugs: Secondary | ICD-10-CM | POA: Diagnosis not present

## 2020-12-17 DIAGNOSIS — E1165 Type 2 diabetes mellitus with hyperglycemia: Secondary | ICD-10-CM | POA: Diagnosis present

## 2020-12-17 DIAGNOSIS — G4733 Obstructive sleep apnea (adult) (pediatric): Secondary | ICD-10-CM | POA: Diagnosis present

## 2020-12-17 DIAGNOSIS — Z833 Family history of diabetes mellitus: Secondary | ICD-10-CM | POA: Diagnosis not present

## 2020-12-17 DIAGNOSIS — Z90710 Acquired absence of both cervix and uterus: Secondary | ICD-10-CM | POA: Diagnosis not present

## 2020-12-17 DIAGNOSIS — D649 Anemia, unspecified: Secondary | ICD-10-CM | POA: Diagnosis present

## 2020-12-17 DIAGNOSIS — Z880 Allergy status to penicillin: Secondary | ICD-10-CM | POA: Diagnosis not present

## 2020-12-17 DIAGNOSIS — A419 Sepsis, unspecified organism: Secondary | ICD-10-CM | POA: Diagnosis present

## 2020-12-17 DIAGNOSIS — N179 Acute kidney failure, unspecified: Secondary | ICD-10-CM

## 2020-12-17 DIAGNOSIS — E785 Hyperlipidemia, unspecified: Secondary | ICD-10-CM | POA: Diagnosis present

## 2020-12-17 DIAGNOSIS — Z6841 Body Mass Index (BMI) 40.0 and over, adult: Secondary | ICD-10-CM | POA: Diagnosis not present

## 2020-12-17 DIAGNOSIS — Z8249 Family history of ischemic heart disease and other diseases of the circulatory system: Secondary | ICD-10-CM | POA: Diagnosis not present

## 2020-12-17 DIAGNOSIS — Z888 Allergy status to other drugs, medicaments and biological substances status: Secondary | ICD-10-CM | POA: Diagnosis not present

## 2020-12-17 LAB — CBC
HCT: 36 % (ref 36.0–46.0)
Hemoglobin: 11.8 g/dL — ABNORMAL LOW (ref 12.0–15.0)
MCH: 31 pg (ref 26.0–34.0)
MCHC: 32.8 g/dL (ref 30.0–36.0)
MCV: 94.5 fL (ref 80.0–100.0)
Platelets: 144 10*3/uL — ABNORMAL LOW (ref 150–400)
RBC: 3.81 MIL/uL — ABNORMAL LOW (ref 3.87–5.11)
RDW: 12.4 % (ref 11.5–15.5)
WBC: 20.3 10*3/uL — ABNORMAL HIGH (ref 4.0–10.5)
nRBC: 0 % (ref 0.0–0.2)

## 2020-12-17 LAB — COMPREHENSIVE METABOLIC PANEL
ALT: 14 U/L (ref 0–44)
AST: 12 U/L — ABNORMAL LOW (ref 15–41)
Albumin: 3.5 g/dL (ref 3.5–5.0)
Alkaline Phosphatase: 47 U/L (ref 38–126)
Anion gap: 5 (ref 5–15)
BUN: 30 mg/dL — ABNORMAL HIGH (ref 6–20)
CO2: 22 mmol/L (ref 22–32)
Calcium: 9.6 mg/dL (ref 8.9–10.3)
Chloride: 105 mmol/L (ref 98–111)
Creatinine, Ser: 1.79 mg/dL — ABNORMAL HIGH (ref 0.44–1.00)
GFR, Estimated: 33 mL/min — ABNORMAL LOW (ref >60)
Glucose, Bld: 286 mg/dL — ABNORMAL HIGH (ref 70–99)
Potassium: 4.6 mmol/L (ref 3.5–5.1)
Sodium: 132 mmol/L — ABNORMAL LOW (ref 135–145)
Total Bilirubin: 0.9 mg/dL (ref 0.3–1.2)
Total Protein: 7 g/dL (ref 6.5–8.1)

## 2020-12-17 LAB — GLUCOSE, CAPILLARY
Glucose-Capillary: 215 mg/dL — ABNORMAL HIGH (ref 70–99)
Glucose-Capillary: 227 mg/dL — ABNORMAL HIGH (ref 70–99)
Glucose-Capillary: 136 mg/dL — ABNORMAL HIGH (ref 70–99)
Glucose-Capillary: 166 mg/dL — ABNORMAL HIGH (ref 70–99)

## 2020-12-17 LAB — PHOSPHORUS: Phosphorus: 2.1 mg/dL — ABNORMAL LOW (ref 2.5–4.6)

## 2020-12-17 LAB — MAGNESIUM: Magnesium: 1.9 mg/dL (ref 1.7–2.4)

## 2020-12-17 LAB — PROTIME-INR
INR: 1.1 (ref 0.8–1.2)
Prothrombin Time: 13.9 seconds (ref 11.4–15.2)

## 2020-12-17 MED ORDER — OXYCODONE HCL 5 MG PO TABS
5.0000 mg | ORAL_TABLET | ORAL | Status: DC | PRN
  Administered 2020-12-20 – 2020-12-22 (×5): 5 mg via ORAL
  Filled 2020-12-17 (×5): qty 1

## 2020-12-17 MED ORDER — SENNA 8.6 MG PO TABS
1.0000 | ORAL_TABLET | Freq: Two times a day (BID) | ORAL | Status: DC
  Administered 2020-12-18 – 2020-12-23 (×10): 8.6 mg via ORAL
  Filled 2020-12-17 (×12): qty 1

## 2020-12-17 MED ORDER — IOHEXOL 9 MG/ML PO SOLN
ORAL | Status: AC
  Administered 2020-12-17: 500 mL
  Filled 2020-12-17: qty 1000

## 2020-12-17 MED ORDER — MAGNESIUM CITRATE PO SOLN: 1.0000 | Freq: Once | ORAL | Status: DC | PRN

## 2020-12-17 MED ORDER — ONDANSETRON HCL 4 MG PO TABS
4.0000 mg | ORAL_TABLET | Freq: Four times a day (QID) | ORAL | Status: DC | PRN
Start: 2020-12-17 — End: 2020-12-23

## 2020-12-17 MED ORDER — ACETAMINOPHEN 650 MG RE SUPP: 650.0000 mg | Freq: Four times a day (QID) | RECTAL | Status: DC | PRN

## 2020-12-17 MED ORDER — LACTATED RINGERS IV SOLN: INTRAVENOUS | Status: DC

## 2020-12-17 MED ORDER — POLYETHYLENE GLYCOL 3350 17 G PO PACK: 17.0000 g | PACK | Freq: Every day | ORAL | Status: DC | PRN

## 2020-12-17 MED ORDER — ENOXAPARIN SODIUM 80 MG/0.8ML IJ SOSY
80.0000 mg | PREFILLED_SYRINGE | INTRAMUSCULAR | Status: DC
  Administered 2020-12-17 – 2020-12-18 (×2): 80 mg via SUBCUTANEOUS
  Filled 2020-12-17 (×3): qty 0.8

## 2020-12-17 MED ORDER — ONDANSETRON HCL 4 MG/2ML IJ SOLN: 4.0000 mg | Freq: Four times a day (QID) | INTRAMUSCULAR | Status: DC | PRN

## 2020-12-17 MED ORDER — ACETAMINOPHEN 325 MG PO TABS
650.0000 mg | ORAL_TABLET | Freq: Four times a day (QID) | ORAL | Status: DC | PRN
  Administered 2020-12-17 – 2020-12-19 (×6): 650 mg via ORAL
  Filled 2020-12-17 (×7): qty 2

## 2020-12-17 MED ORDER — INSULIN ASPART 100 UNIT/ML IJ SOLN
0.0000 [IU] | Freq: Three times a day (TID) | INTRAMUSCULAR | Status: DC
  Administered 2020-12-17 – 2020-12-18 (×2): 3 [IU] via SUBCUTANEOUS

## 2020-12-17 MED ORDER — VANCOMYCIN HCL 1500 MG/300ML IV SOLN
1500.0000 mg | INTRAVENOUS | Status: DC
  Administered 2020-12-17 – 2020-12-18 (×2): 1500 mg via INTRAVENOUS
  Filled 2020-12-17 (×2): qty 300

## 2020-12-17 MED ORDER — BISACODYL 10 MG RE SUPP: 10.0000 mg | Freq: Every day | RECTAL | Status: DC | PRN

## 2020-12-17 MED ORDER — INSULIN ASPART 100 UNIT/ML IJ SOLN: 0.0000 [IU] | Freq: Every day | INTRAMUSCULAR | Status: DC

## 2020-12-17 MED ORDER — TRAMADOL HCL 50 MG PO TABS
50.0000 mg | ORAL_TABLET | Freq: Four times a day (QID) | ORAL | Status: DC | PRN
  Administered 2020-12-17 – 2020-12-23 (×4): 50 mg via ORAL
  Filled 2020-12-17 (×4): qty 1

## 2020-12-17 MED ORDER — IOHEXOL 9 MG/ML PO SOLN
500.0000 mL | ORAL | Status: AC
  Administered 2020-12-17 (×2): 500 mL via ORAL

## 2020-12-17 NOTE — Progress Notes (Signed)
PT with out orders and pending orders this am.

## 2020-12-17 NOTE — Plan of Care (Signed)
Clinical plan of care discussed with patient and discussed with patient

## 2020-12-17 NOTE — H&P (Signed)
History and Physical    Jenna Stokes ESP:[id number] DOB: 03-02-1966 DOA: 12/16/2020  PCP: Caren Macadam, MD Patient coming from: [place]   Chief Complaint: generalized fatigue - fever/chills   HPI: 55yo with a history of morbid obesity, DM2, HLD, and HTN who presented to [facility] with severe fatigue and chills of approximately 24 hours duration.  Her only localizing complaint was that of a "boil" in the left inguinal region that has been present for approximately 2 days.  Assessment/Plan  Sepsis POA - L perineal cellulitis v/s phlegmon v/s deep abscess CoViD negative - WBC 20.4 w/ temp of 102 at presentation, and HR up to 108 - CXR unrevealing - not hypoxic - UA unrevealing - area in L inguinal crease/thigh/perineum worrisome for phlegmon - broad spectrum abx coverage to include MRSA for now - monitor lesion for maturation - check CT pelvis to rule out deep abscess - body habitus makes exam very difficutly   Acute kidney injury  Crt 1.75 at presentation w/ no prior hx of CKD - hydrate and monitor trend   Uncontrolled DM Monitor CBG closely - utilize SSI for strict CBG control to maximize healing potential   HTN Blood pressure currently controlled  HLD Continue home medical therapy  Morbid Obesity - Body mass index is 52.59 kg/m.   DVT prophylaxis: lovenox  Code Status: FULL Family Communication: No family present at time of exam Disposition Plan: admit to inpatient  Consults called: None indicated thus far   Review of Systems: As per HPI otherwise 10 point review of systems negative.   Past Medical History:  Diagnosis Date   DM type 2, uncontrolled, with renal complications ([place]) 6/33/3545   Hyperlipidemia    Hypertension    Morbid obesity ([place])     Past Surgical History:  Procedure Laterality Date   ABDOMINAL HYSTERECTOMY     CESAREAN SECTION     TONSILLECTOMY      Family History  Family History  Problem Relation Age  of Onset   Heart disease Mother    Diabetes Mother    Hypertension Sister    Diabetes Sister    Adrenal disorder Sister     Social History   reports that she has never smoked. She has never used smokeless tobacco. She reports that she does not drink alcohol and does not use drugs.  Allergies Allergies  Allergen Reactions   Penicillins Other (See Comments)    Unknown childhood reaction Has patient had a PCN reaction causing immediate rash, facial/tongue/throat swelling, SOB or lightheadedness with hypotension: Unknown Has patient had a PCN reaction causing severe rash involving mucus membranes or skin necrosis: Unknown Has patient had a PCN reaction that required hospitalization: Unknown Has patient had a PCN reaction occurring within the last 10 years: No If all of the above answers are "NO", then may proceed with Cephalosporin use.    Dapagliflozin-Metformin Hcl Er Diarrhea and Other (See Comments)   Metformin Hcl Diarrhea and Other (See Comments)    Prior to Admission medications   Medication Sig Start Date End Date Taking? Authorizing Provider  blood glucose meter kit and supplies KIT Dispense based on patient and insurance preference. Use up to four times daily as directed. (FOR ICD-9 250.00, 250.01). 06/16/19  Yes Koberlein, Junell C, MD  glimepiride (AMARYL) 1 MG tablet Take 1 mg by mouth 3 (three) times daily.   Yes [provider]  Olmesartan-amLODIPine-HCTZ 40-10-25 MG TABS TAKE ONE TABLET BY MOUTH  DAILY 10/30/20  Yes Koberlein, Steele Berg, MD  spironolactone (ALDACTONE) 100 MG tablet TAKE ONE TABLET BY MOUTH DAILY 04/19/20  Yes Caren Macadam, MD    Physical Exam: Vitals:   12/17/20 0041 12/17/20 0426 12/17/20 0726 12/17/20 0906  BP: (!) 110/92 (!) 155/63  (!) 119/57  Pulse: 94 94  93  Resp: 20 20  18   Temp: (!) 100.5 F (38.1 C) (!) 100.7 F (38.2 C) 100.3 F (37.9 C) 99.1 F (37.3 C)  TempSrc: Oral Oral Oral Oral  SpO2: 99% 100%  98%     Constitutional: NAD, calm, comfortable Eyes: PERRL, lids and conjunctivae normal ENMT: Mucous membranes are moist.  Respiratory: clear to auscultation bilaterally, no wheezing, no crackles.  Breath sounds distant given body habitus. Cardiovascular: Regular rate and rhythm, no murmurs / rubs / gallops.  Heart sounds distant due to body habitus.  Change of chronic venous stasis bilateral lower extremities with chronic appearing 2+ edema.  No open wounds bilateral lower extremities. Abdomen: Morbidly obese, soft, bowel sounds positive, unable to comment on masses due to body habitus Musculoskeletal: no clubbing / cyanosis. No joint deformity upper and lower extremities. Good ROM, no contractures. Normal muscle tone.  Skin: In the left perineum just lateral to the labia and near the inguinal fold there is a tender indurated area measuring approximately 2-1/2 inches in length and 1 inch in width without clear overlying cutaneous abnormality, fluctuance, or discharge Neurologic: CN 2-12 grossly intact. Sensation intact Psychiatric: Normal judgment and insight. Alert and oriented x 3. Normal mood.    Labs on Admission:   CBC: Recent Labs  Lab 12/16/20 1558  WBC 20.4*  NEUTROABS 16.6*  HGB 14.0  HCT 43.3  MCV 92.5  PLT 122   Basic Metabolic Panel: Recent Labs  Lab 12/16/20 1558  NA 135  K 5.1  CL 100  CO2 25  GLUCOSE 191*  BUN 30*  CREATININE 1.75*  CALCIUM 10.8*    Recent Labs  Lab 12/16/20 1558  LIPASE 25   CBG: Recent Labs  Lab 12/17/20 0722  GLUCAP 215*   Urine analysis:    Component Value Date/Time   COLORURINE YELLOW 12/16/2020 1506   APPEARANCEUR CLEAR 12/16/2020 1506   LABSPEC 1.020 12/16/2020 1506   PHURINE 5.5 12/16/2020 1506   GLUCOSEU NEGATIVE 12/16/2020 1506   GLUCOSEU NEGATIVE 06/09/2019 1242   HGBUR NEGATIVE 12/16/2020 1506   BILIRUBINUR NEGATIVE 12/16/2020 1506   KETONESUR NEGATIVE 12/16/2020 1506   PROTEINUR 30 (A) 12/16/2020 1506    UROBILINOGEN 0.2 06/09/2019 1242   NITRITE NEGATIVE 12/16/2020 1506   LEUKOCYTESUR NEGATIVE 12/16/2020 1506   Recent Results (from the past 240 hour(s))  Resp Panel by RT-PCR (Flu A&B, Covid) Nasopharyngeal Swab     Status: None   Collection Time: 12/16/20  3:07 PM   Specimen: Nasopharyngeal Swab; Nasopharyngeal(NP) swabs in vial transport medium  Result Value Ref Range Status   SARS Coronavirus 2 by RT PCR NEGATIVE NEGATIVE Final    Comment: (NOTE) SARS-CoV-2 target nucleic acids are NOT DETECTED.  The SARS-CoV-2 RNA is generally detectable in upper respiratory specimens during the acute phase of infection. The lowest concentration of SARS-CoV-2 viral copies this assay can detect is 138 copies/mL. A negative result does not preclude SARS-Cov-2 infection and should not be used as the sole basis for treatment or other patient management decisions. A negative result may occur with  improper specimen collection/handling, submission of specimen other than nasopharyngeal swab, presence of viral mutation(s) within the  areas targeted by this assay, and inadequate number of viral copies(<138 copies/mL). A negative result must be combined with clinical observations, patient history, and epidemiological information. The expected result is Negative.  Fact Sheet for Patients:  EntrepreneurPulse.com.au  Fact Sheet for Healthcare Providers:  IncredibleEmployment.be  This test is no t yet approved or cleared by the [place] FDA and  has been authorized for detection and/or diagnosis of SARS-CoV-2 by FDA under an Emergency Use Authorization (EUA). This EUA will remain  in effect (meaning this test can be used) for the duration of the COVID-19 declaration under Section 564(b)(1) of the Act, 21 U.S.C.section 360bbb-3(b)(1), unless the authorization is terminated  or revoked sooner.       Influenza A by PCR NEGATIVE NEGATIVE Final   Influenza B by PCR  NEGATIVE NEGATIVE Final    Comment: (NOTE) The Xpert Xpress SARS-CoV-2/FLU/RSV plus assay is intended as an aid in the diagnosis of influenza from Nasopharyngeal swab specimens and should not be used as a sole basis for treatment. Nasal washings and aspirates are unacceptable for Xpert Xpress SARS-CoV-2/FLU/RSV testing.  Fact Sheet for Patients: EntrepreneurPulse.com.au  Fact Sheet for Healthcare Providers: IncredibleEmployment.be  This test is not yet approved or cleared by the [place] FDA and has been authorized for detection and/or diagnosis of SARS-CoV-2 by FDA under an Emergency Use Authorization (EUA). This EUA will remain in effect (meaning this test can be used) for the duration of the COVID-19 declaration under Section 564(b)(1) of the Act, 21 U.S.C. section 360bbb-3(b)(1), unless the authorization is terminated or revoked.  Performed at [facility], [address] [place], [place] [address]      Radiological Exams on Admission: DG Chest Portable 1 View  Result Date: 12/16/2020 CLINICAL DATA:  Generalized body aches. EXAM: PORTABLE CHEST 1 VIEW COMPARISON:  01/08/2018. FINDINGS: Cardiac silhouette is normal in size. No mediastinal or hilar masses. No evidence of adenopathy. Clear lungs.  No convincing pleural effusion and no pneumothorax. Skeletal structures are grossly intact. IMPRESSION: No active disease. Electronically Signed   By: Lajean Manes M.D.   On: 12/16/2020 17:01    EKG: Independently reviewed.  Sinus tachycardia with frequent PVCs.  Cherene Altes, MD [facility] Office  [phone] Pager - Text Page per Amion as per below:  On-Call/Text Page:      [email or web address]  If 7PM-7AM, please contact night-coverage [email or web address] 12/17/2020, 9:26 AM

## 2020-12-17 NOTE — Progress Notes (Signed)
Pharmacy Antibiotic Note  Jenna Stokes is a 55 y.o. female presented to the ED on 12/16/2020 with generalized weakness.  She was noted to have a boil in her inguinal region with concern for abscess.  She currently on cefepime and vancomycin for infection.    Today, 12/17/2020: - day #1 abx - Tmax 102, wbc 20.3 - scr up slightly to 1.79 (crcl~40N)  Plan: - continue cefepime 2gm q12h - vancomycin 1500 mg IV q24h for est AUC 465 - monitor renal function closely - Per Dr. Sharon Seller, current plan is  to treat for 7 days  ________________________________________  Height: 5\' 11"  (180.3 cm) Weight: (!) 171.1 kg (377 lb 1.6 oz) IBW/kg (Calculated) : 70.8  Temp (24hrs), Avg:100.2 F (37.9 C), Min:98.3 F (36.8 C), Max:102 F (38.9 C)  Recent Labs  Lab 12/16/20 1558 12/17/20 1020  WBC 20.4* 20.3*  CREATININE 1.75* 1.79*    Estimated Creatinine Clearance: 62.9 mL/min (A) (by C-G formula based on SCr of 1.79 mg/dL (H)).    Allergies  Allergen Reactions   Penicillins Other (See Comments)    Unknown childhood reaction Has patient had a PCN reaction causing immediate rash, facial/tongue/throat swelling, SOB or lightheadedness with hypotension: Unknown Has patient had a PCN reaction causing severe rash involving mucus membranes or skin necrosis: Unknown Has patient had a PCN reaction that required hospitalization: Unknown Has patient had a PCN reaction occurring within the last 10 years: No If all of the above answers are "NO", then may proceed with Cephalosporin use.    Dapagliflozin-Metformin Hcl Er Diarrhea and Other (See Comments)   Metformin Hcl Diarrhea and Other (See Comments)      Thank you for allowing pharmacy to be a part of this patient's care.  12/19/20 12/17/2020 11:01 AM

## 2020-12-17 NOTE — Progress Notes (Signed)
Patient arrived via EMS. Patient denies any discomfort at present vss. Awaiting admitt orders

## 2020-12-18 DIAGNOSIS — N179 Acute kidney failure, unspecified: Secondary | ICD-10-CM | POA: Diagnosis not present

## 2020-12-18 DIAGNOSIS — A419 Sepsis, unspecified organism: Secondary | ICD-10-CM | POA: Diagnosis not present

## 2020-12-18 DIAGNOSIS — L03818 Cellulitis of other sites: Secondary | ICD-10-CM | POA: Diagnosis not present

## 2020-12-18 LAB — CBC
HCT: 37.3 % (ref 36.0–46.0)
Hemoglobin: 11.8 g/dL — ABNORMAL LOW (ref 12.0–15.0)
MCH: 30.3 pg (ref 26.0–34.0)
MCHC: 31.6 g/dL (ref 30.0–36.0)
MCV: 95.9 fL (ref 80.0–100.0)
Platelets: 145 10*3/uL — ABNORMAL LOW (ref 150–400)
RBC: 3.89 MIL/uL (ref 3.87–5.11)
RDW: 12.5 % (ref 11.5–15.5)
WBC: 18.8 10*3/uL — ABNORMAL HIGH (ref 4.0–10.5)
nRBC: 0 % (ref 0.0–0.2)

## 2020-12-18 LAB — HEMOGLOBIN A1C
Hgb A1c MFr Bld: 8 % — ABNORMAL HIGH (ref 4.8–5.6)
Mean Plasma Glucose: 182.9 mg/dL

## 2020-12-18 LAB — BASIC METABOLIC PANEL
Anion gap: 8 (ref 5–15)
BUN: 30 mg/dL — ABNORMAL HIGH (ref 6–20)
CO2: 22 mmol/L (ref 22–32)
Calcium: 9.8 mg/dL (ref 8.9–10.3)
Chloride: 102 mmol/L (ref 98–111)
Creatinine, Ser: 1.56 mg/dL — ABNORMAL HIGH (ref 0.44–1.00)
GFR, Estimated: 39 mL/min — ABNORMAL LOW (ref >60)
Glucose, Bld: 250 mg/dL — ABNORMAL HIGH (ref 70–99)
Potassium: 4.8 mmol/L (ref 3.5–5.1)
Sodium: 132 mmol/L — ABNORMAL LOW (ref 135–145)

## 2020-12-18 LAB — GLUCOSE, CAPILLARY
Glucose-Capillary: 237 mg/dL — ABNORMAL HIGH (ref 70–99)
Glucose-Capillary: 202 mg/dL — ABNORMAL HIGH (ref 70–99)
Glucose-Capillary: 162 mg/dL — ABNORMAL HIGH (ref 70–99)
Glucose-Capillary: 183 mg/dL — ABNORMAL HIGH (ref 70–99)

## 2020-12-18 MED ORDER — INSULIN ASPART 100 UNIT/ML IJ SOLN
0.0000 [IU] | Freq: Three times a day (TID) | INTRAMUSCULAR | Status: DC
  Administered 2020-12-18: 5 [IU] via SUBCUTANEOUS
  Administered 2020-12-18: 3 [IU] via SUBCUTANEOUS
  Administered 2020-12-19 (×2): 5 [IU] via SUBCUTANEOUS
  Administered 2020-12-21 – 2020-12-22 (×4): 3 [IU] via SUBCUTANEOUS

## 2020-12-18 MED ORDER — INSULIN GLARGINE 100 UNIT/ML ~~LOC~~ SOLN
16.0000 [IU] | Freq: Every day | SUBCUTANEOUS | Status: DC
  Administered 2020-12-18 – 2020-12-23 (×6): 16 [IU] via SUBCUTANEOUS
  Filled 2020-12-18 (×6): qty 0.16

## 2020-12-18 MED ORDER — SODIUM CHLORIDE 0.9 % IV SOLN
2.0000 g | Freq: Three times a day (TID) | INTRAVENOUS | Status: DC
  Administered 2020-12-18 – 2020-12-22 (×12): 2 g via INTRAVENOUS
  Filled 2020-12-18 (×14): qty 2

## 2020-12-18 MED ORDER — INSULIN ASPART 100 UNIT/ML IJ SOLN
0.0000 [IU] | Freq: Every day | INTRAMUSCULAR | Status: DC
  Administered 2020-12-21: 2 [IU] via SUBCUTANEOUS

## 2020-12-18 NOTE — Plan of Care (Signed)
°  Problem: Education: °Goal: Knowledge of General Education information will improve °Description: Including pain rating scale, medication(s)/side effects and non-pharmacologic comfort measures °Outcome: Progressing °  °Problem: Coping: °Goal: Level of anxiety will decrease °Outcome: Progressing °  °Problem: Pain Managment: °Goal: General experience of comfort will improve °Outcome: Progressing °  °Problem: Safety: °Goal: Ability to remain free from injury will improve °Outcome: Progressing °  °Problem: Skin Integrity: °Goal: Risk for impaired skin integrity will decrease °Outcome: Progressing °  °Problem: Respiratory: °Goal: Ability to maintain adequate ventilation will improve °Outcome: Progressing °  °

## 2020-12-18 NOTE — Progress Notes (Signed)
Pharmacy Antibiotic Note  Jenna Stokes is a 55 y.o. female presented to the ED on 12/16/2020 with generalized weakness.  She was noted to have a boil in her inguinal region with concern for abscess.  She is currently on cefepime and vancomycin for infection.  Today, 12/18/2020: WBC remains elevated SCr 1.56, CrCl ~70 mL/min. Slightly improved Tmax 100.8 F  Plan: Increase cefepime to 2 g IV q8h Continue vancomycin 1500 mg IV q24h for goal AUC 400-550 Monitor renal function and culture data _________________________________________________  Height: 5\' 11"  (180.3 cm) Weight: (!) 171.1 kg (377 lb 1.6 oz) IBW/kg (Calculated) : 70.8  Temp (24hrs), Avg:99.8 F (37.7 C), Min:99.1 F (37.3 C), Max:100.8 F (38.2 C)  Recent Labs  Lab 12/16/20 1558 12/17/20 1020 12/18/20 0324  WBC 20.4* 20.3* 18.8*  CREATININE 1.75* 1.79* 1.56*     Estimated Creatinine Clearance: 72.2 mL/min (A) (by C-G formula based on SCr of 1.56 mg/dL (H)).    Allergies  Allergen Reactions   Penicillins Other (See Comments)    Unknown childhood reaction Has patient had a PCN reaction causing immediate rash, facial/tongue/throat swelling, SOB or lightheadedness with hypotension: Unknown Has patient had a PCN reaction causing severe rash involving mucus membranes or skin necrosis: Unknown Has patient had a PCN reaction that required hospitalization: Unknown Has patient had a PCN reaction occurring within the last 10 years: No If all of the above answers are "NO", then may proceed with Cephalosporin use.    Dapagliflozin-Metformin Hcl Er Diarrhea and Other (See Comments)   Metformin Hcl Diarrhea and Other (See Comments)    Thank you for allowing pharmacy to be a part of this patient's care.  12/20/20, PharmD 12/18/2020 10:37 AM

## 2020-12-18 NOTE — Progress Notes (Signed)
Jenna Stokes  IRS:[id number] DOB: 05/10/1966 DOA: 12/16/2020 PCP: Wynn Banker, MD    Brief Narrative:  [phone] with a history of morbid obesity, DM2, HLD, and HTN who presented to [facility] with severe fatigue and chills of approximately 24 hours duration.  Her only localizing complaint was that of a "boil" in the left inguinal region that has been present for approximately 2 days.  Significant Events:  7/17 admit via [facility] 7/17 CT noting L inguinal phlegmon  Consultants:  None  Code Status: FULL CODE  Antimicrobials:  Cefepime 7/16 > Vanc 7/16 >  DVT prophylaxis: Lovenox   Subjective: Pain in the patient's left groin has not significantly changed.  Overall however she feels better.  She is less fatigued and reports no further chills.  Assessment & Plan:  Sepsis POA - L perineal cellulitis v/s phlegmon v/s deep abscess WBC 20.4 w/ temp of 102 at presentation, and HR up to 108 - area in L inguinal crease/thigh/perineum worrisome for phlegmon on exam - CT abdom/pelvis noted 12 x 6 cm subcutaneous stranding in the L inguinal area, as well as a 4.2 x 1.8cm area of fat stranding - cont broad spectrum abx coverage to include MRSA for now - monitor lesion for maturation - body habitus makes exam very difficutly - WBC improving - discussed at length w/ pt - monitor for now and consult Surg if becomes clear abscess which would be amenable to drainage    Acute kidney injury Crt 1.75 at presentation w/ no prior hx of CKD - crt improving w/ volume resusciation - monitor trend   Recent Labs  Lab 12/16/20 1558 12/17/20 1020 12/18/20 0324  CREATININE 1.75* 1.79* 1.56*    Uncontrolled DM CBG remains variable - adjust tx for tight control in setting of acute infection    HTN Blood pressure currently controlled   HLD Continue home medical therapy   Morbid Obesity - Body mass index is 52.59 kg/m.    Family Communication:  Status is:  Inpatient  Remains inpatient appropriate because:Inpatient level of care appropriate due to severity of illness  Dispo: The patient is from: Home              Anticipated d/c is to: Home              Patient currently is not medically stable to d/c.   Difficult to place patient No   Objective: Blood pressure (!) 141/71, pulse 88, temperature 99.1 F (37.3 C), temperature source Oral, resp. rate 18, height 5\' 11"  (1.803 m), weight (!) 171.1 kg, last menstrual period 08/10/2006, SpO2 100 %.  Intake/Output Summary (Last 24 hours) at 12/18/2020 0940 Last data filed at 12/18/2020 0600 Gross per 24 hour  Intake 2884.31 ml  Output --  Net 2884.31 ml   Filed Weights   12/17/20 1025  Weight: (!) 171.1 kg    Examination: General: No acute respiratory distress Lungs: CTA B Cardiovascular: RRR without murmur Abdomen: Morbidly obese, soft, BS positive, no rebound GU: Left groin with less appreciable induration today though is tender to touch but with no clear cutaneous wound or discharge at present -exam very difficult due to body habitus Extremities: Chronic appearing bilateral lower extremity edema  CBC: Recent Labs  Lab 12/16/20 1558 12/17/20 1020 12/18/20 0324  WBC 20.4* 20.3* 18.8*  NEUTROABS 16.6*  --   --   HGB 14.0 11.8* 11.8*  HCT 43.3 36.0 37.3  MCV 92.5 94.5 95.9  PLT  180 144* 145*   Basic Metabolic Panel: Recent Labs  Lab 12/16/20 1558 12/17/20 1020 12/18/20 0324  NA 135 132* 132*  K 5.1 4.6 4.8  CL 100 105 102  CO2 25 22 22   GLUCOSE 191* 286* 250*  BUN 30* 30* 30*  CREATININE 1.75* 1.79* 1.56*  CALCIUM 10.8* 9.6 9.8  MG  --  1.9  --   PHOS  --  2.1*  --    GFR: Estimated Creatinine Clearance: 72.2 mL/min (A) (by C-G formula based on SCr of 1.56 mg/dL (H)).  Liver Function Tests: Recent Labs  Lab 12/17/20 1020  AST 12*  ALT 14  ALKPHOS 47  BILITOT 0.9  PROT 7.0  ALBUMIN 3.5   Recent Labs  Lab 12/16/20 1558  LIPASE 25    Coagulation  Profile: Recent Labs  Lab 12/17/20 1020  INR 1.1    HbA1C: Hgb A1c MFr Bld  Date/Time Value Ref Range Status  12/18/2020 03:24 AM 8.0 (H) 4.8 - 5.6 % Final    Comment:    (NOTE) Pre diabetes:          5.7%-6.4%  Diabetes:              >6.4%  Glycemic control for   <7.0% adults with diabetes   09/15/2019 12:23 PM 7.8 (H) 4.6 - 6.5 % Final    Comment:    Glycemic Control Guidelines for People with Diabetes:Non Diabetic:  <6%Goal of Therapy: <7%Additional Action Suggested:  >8%     CBG: Recent Labs  Lab 12/17/20 0722 12/17/20 1135 12/17/20 1616 12/17/20 2019 12/18/20 0720  GLUCAP 215* 227* 136* 166* 237*    Recent Results (from the past 240 hour(s))  Resp Panel by RT-PCR (Flu A&B, Covid) Nasopharyngeal Swab     Status: None   Collection Time: 12/16/20  3:07 PM   Specimen: Nasopharyngeal Swab; Nasopharyngeal(NP) swabs in vial transport medium  Result Value Ref Range Status   SARS Coronavirus 2 by RT PCR NEGATIVE NEGATIVE Final    Comment: (NOTE) SARS-CoV-2 target nucleic acids are NOT DETECTED.  The SARS-CoV-2 RNA is generally detectable in upper respiratory specimens during the acute phase of infection. The lowest concentration of SARS-CoV-2 viral copies this assay can detect is 138 copies/mL. A negative result does not preclude SARS-Cov-2 infection and should not be used as the sole basis for treatment or other patient management decisions. A negative result may occur with  improper specimen collection/handling, submission of specimen other than nasopharyngeal swab, presence of viral mutation(s) within the areas targeted by this assay, and inadequate number of viral copies(<138 copies/mL). A negative result must be combined with clinical observations, patient history, and epidemiological information. The expected result is Negative.  Fact Sheet for Patients:  12/18/20  Fact Sheet for Healthcare Providers:   BloggerCourse.com  This test is no t yet approved or cleared by the [place] FDA and  has been authorized for detection and/or diagnosis of SARS-CoV-2 by FDA under an Emergency Use Authorization (EUA). This EUA will remain  in effect (meaning this test can be used) for the duration of the COVID-19 declaration under Section 564(b)(1) of the Act, 21 U.S.C.section 360bbb-3(b)(1), unless the authorization is terminated  or revoked sooner.       Influenza A by PCR NEGATIVE NEGATIVE Final   Influenza B by PCR NEGATIVE NEGATIVE Final    Comment: (NOTE) The Xpert Xpress SARS-CoV-2/FLU/RSV plus assay is intended as an aid in the diagnosis of influenza from Nasopharyngeal swab specimens  and should not be used as a sole basis for treatment. Nasal washings and aspirates are unacceptable for Xpert Xpress SARS-CoV-2/FLU/RSV testing.  Fact Sheet for Patients: BloggerCourse.com  Fact Sheet for Healthcare Providers: SeriousBroker.it  This test is not yet approved or cleared by the [place] FDA and has been authorized for detection and/or diagnosis of SARS-CoV-2 by FDA under an Emergency Use Authorization (EUA). This EUA will remain in effect (meaning this test can be used) for the duration of the COVID-19 declaration under Section 564(b)(1) of the Act, 21 U.S.C. section 360bbb-3(b)(1), unless the authorization is terminated or revoked.  Performed at [facility], [address] [place], [place] [address]   Blood culture (routine x 2)     Status: None (Preliminary result)   Collection Time: 12/16/20  7:38 PM   Specimen: BLOOD  Result Value Ref Range Status   Specimen Description   Final    BLOOD RIGHT ANTECUBITAL Performed at [facility], [address], [place], [place] [address]    Special Requests   Final    BOTTLES DRAWN AEROBIC AND ANAEROBIC Blood Culture  adequate volume Performed at [facility], [address] [place], [place] [address]    Culture   Final    NO GROWTH 2 DAYS Performed at [facility], [address] [place], [place] [address]    Report Status PENDING  Incomplete  Blood culture (routine x 2)     Status: None (Preliminary result)   Collection Time: 12/16/20  7:43 PM   Specimen: BLOOD  Result Value Ref Range Status   Specimen Description   Final    BLOOD BLOOD RIGHT FOREARM Performed at [facility], [address] [place], [place] [address]    Special Requests   Final    BOTTLES DRAWN AEROBIC AND ANAEROBIC Blood Culture adequate volume Performed at [facility], [address], [place], [place] [address]    Culture   Final    NO GROWTH 2 DAYS Performed at [facility], [address] [place], [place] [address]    Report Status PENDING  Incomplete     Scheduled Meds:  enoxaparin (LOVENOX) injection  80 mg Subcutaneous Q24H   insulin aspart  0-5 Units Subcutaneous QHS   insulin aspart  0-9 Units Subcutaneous TID WC   senna  1 tablet Oral BID   Continuous Infusions:  ceFEPime (MAXIPIME) IV 2 g (12/17/20 2139)   lactated ringers 125 mL/hr at 12/18/20 0658   vancomycin 1,500 mg (12/17/20 2243)     LOS: 1 day   Lonia Blood, MD [facility] Office  [phone] Pager - Text Page per Loretha Stapler  If 7PM-7AM, please contact night-coverage per Amion 12/18/2020, 9:40 AM

## 2020-12-18 NOTE — Progress Notes (Signed)
Pt had a fever of 100.8. Gave PRN tylenol. Pt's temperature came down to 99.4. Pt also refused nighttime Senna.

## 2020-12-19 DIAGNOSIS — L03818 Cellulitis of other sites: Secondary | ICD-10-CM | POA: Diagnosis not present

## 2020-12-19 DIAGNOSIS — N179 Acute kidney failure, unspecified: Secondary | ICD-10-CM | POA: Diagnosis not present

## 2020-12-19 DIAGNOSIS — A419 Sepsis, unspecified organism: Secondary | ICD-10-CM | POA: Diagnosis not present

## 2020-12-19 LAB — CBC
HCT: 33.9 % — ABNORMAL LOW (ref 36.0–46.0)
Hemoglobin: 10.7 g/dL — ABNORMAL LOW (ref 12.0–15.0)
MCH: 30.2 pg (ref 26.0–34.0)
MCHC: 31.6 g/dL (ref 30.0–36.0)
MCV: 95.8 fL (ref 80.0–100.0)
Platelets: 148 10*3/uL — ABNORMAL LOW (ref 150–400)
RBC: 3.54 MIL/uL — ABNORMAL LOW (ref 3.87–5.11)
RDW: 12.2 % (ref 11.5–15.5)
WBC: 16.3 10*3/uL — ABNORMAL HIGH (ref 4.0–10.5)
nRBC: 0 % (ref 0.0–0.2)

## 2020-12-19 LAB — BASIC METABOLIC PANEL
Anion gap: 9 (ref 5–15)
BUN: 27 mg/dL — ABNORMAL HIGH (ref 6–20)
CO2: 23 mmol/L (ref 22–32)
Calcium: 10 mg/dL (ref 8.9–10.3)
Chloride: 103 mmol/L (ref 98–111)
Creatinine, Ser: 1.49 mg/dL — ABNORMAL HIGH (ref 0.44–1.00)
GFR, Estimated: 41 mL/min — ABNORMAL LOW (ref >60)
Glucose, Bld: 180 mg/dL — ABNORMAL HIGH (ref 70–99)
Potassium: 4.7 mmol/L (ref 3.5–5.1)
Sodium: 135 mmol/L (ref 135–145)

## 2020-12-19 LAB — GLUCOSE, CAPILLARY
Glucose-Capillary: 219 mg/dL — ABNORMAL HIGH (ref 70–99)
Glucose-Capillary: 205 mg/dL — ABNORMAL HIGH (ref 70–99)
Glucose-Capillary: 238 mg/dL — ABNORMAL HIGH (ref 70–99)
Glucose-Capillary: 190 mg/dL — ABNORMAL HIGH (ref 70–99)

## 2020-12-19 MED ORDER — SODIUM CHLORIDE 0.9 % IV SOLN: INTRAVENOUS | Status: DC

## 2020-12-19 MED ORDER — VANCOMYCIN HCL 2000 MG/400ML IV SOLN
2000.0000 mg | INTRAVENOUS | Status: DC
  Administered 2020-12-19 – 2020-12-22 (×3): 2000 mg via INTRAVENOUS
  Filled 2020-12-19 (×3): qty 400

## 2020-12-19 MED ORDER — ENOXAPARIN SODIUM 80 MG/0.8ML IJ SOSY
80.0000 mg | PREFILLED_SYRINGE | INTRAMUSCULAR | Status: DC
  Administered 2020-12-21: 80 mg via SUBCUTANEOUS
  Filled 2020-12-19 (×2): qty 0.8

## 2020-12-19 MED ORDER — SODIUM CHLORIDE 0.9 % IV BOLUS
500.0000 mL | Freq: Once | INTRAVENOUS | Status: AC
  Administered 2020-12-19: 500 mL via INTRAVENOUS

## 2020-12-19 MED ORDER — DIPHENHYDRAMINE-ZINC ACETATE 2-0.1 % EX CREA
TOPICAL_CREAM | Freq: Three times a day (TID) | CUTANEOUS | Status: DC | PRN
  Filled 2020-12-19: qty 28

## 2020-12-19 NOTE — Progress Notes (Signed)
Pharmacy Antibiotic Note  Jenna Stokes is a 55 y.o. female presented to the ED on 12/16/2020 with generalized weakness.  She was noted to have a boil in her inguinal region with concern for abscess.  She is currently on cefepime and vancomycin for infection.  Today, 12/19/2020: WBC remains elevated but trending down SCr 1.49, CrCl ~75 mL/min. Continues to improve Tmax 100.2 F  Plan: Continue cefepime 2 g IV q8h Increase vancomycin to 2000 mg IV q24h for estimated AUC of 526 (goal AUC 400-550) Monitor renal function and culture data _________________________________________________  Height: 5\' 11"  (180.3 cm) Weight: (!) 171.1 kg (377 lb 1.6 oz) IBW/kg (Calculated) : 70.8  Temp (24hrs), Avg:100 F (37.8 C), Min:99.7 F (37.6 C), Max:100.2 F (37.9 C)  Recent Labs  Lab 12/16/20 1558 12/17/20 1020 12/18/20 0324 12/19/20 0328  WBC 20.4* 20.3* 18.8* 16.3*  CREATININE 1.75* 1.79* 1.56* 1.49*     Estimated Creatinine Clearance: 75.6 mL/min (A) (by C-G formula based on SCr of 1.49 mg/dL (H)).    Allergies  Allergen Reactions   Penicillins Other (See Comments)    Unknown childhood reaction Has patient had a PCN reaction causing immediate rash, facial/tongue/throat swelling, SOB or lightheadedness with hypotension: Unknown Has patient had a PCN reaction causing severe rash involving mucus membranes or skin necrosis: Unknown Has patient had a PCN reaction that required hospitalization: Unknown Has patient had a PCN reaction occurring within the last 10 years: No If all of the above answers are "NO", then may proceed with Cephalosporin use.    Dapagliflozin-Metformin Hcl Er Diarrhea and Other (See Comments)   Metformin Hcl Diarrhea and Other (See Comments)    Antimicrobials this admission:  cefepime 7/16 >>  vancomycin 7/16 >>   Dose adjustments this admission:  7/19: Vanc 1500 mg q24 >> 2000 mg q24h  Microbiology results:  7/16 BCx: ngtd  Thank you for allowing  pharmacy to be a part of this patient's care.  8/16, PharmD 12/19/2020 10:46 AM

## 2020-12-19 NOTE — Plan of Care (Signed)
  Problem: Coping: Goal: Level of anxiety will decrease Outcome: Completed/Met

## 2020-12-19 NOTE — Progress Notes (Signed)
Pt stated she feels drained. BP 99/83. HR 45. MD notified.

## 2020-12-19 NOTE — Consult Note (Signed)
    Consult Note  Jenna Stokes 05/18/1966  [id number].    Requesting MD: Dr. Jeffrey McClung Chief Complaint/Reason for Consult: Groin abscess  HPI:  Patient is a 54 year old female with T2DM, HTN, HLD, and morbid obesity who is currently admitted with sepsis thought to be from left groin cellulitis/abscess. Patient reports feeling a small lump on her labia about a week ago and thought it was just a blackhead or boil and that it would get better on its own. The patient went to [facility] 7/17 and was transferred here for admission. WBC was 20K on admit and CT showed small 4 cm x 2 cm fluid area that could be possible developing abscess. She was started on broad spectrum antibiotics. Leukocytosis has improved but patient still having subjective fever/chills and fatigue and feels more pain and swelling to left groin now extending from labia up to mons pubis. She denies drainage from the area. Denies chest pain, SOB, abdominal pain, n/v, diarrhea, urinary symptoms, vaginal bleeding/discharge. She is not usually on any blood thinners, has been getting weight base lovenox here (although refused dose today). She reports hx of vomiting with PCN as a child and diarrhea with metformin. She reports blood glucose at home ranges from 170-200s but she is trying to get it better controlled. She reports social alcohol use maybe 1-2x monthly and denies tobacco or illicit drug use. 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 She does have sisters and an adult son who are local to .   ROS: Review of Systems  Constitutional:  Positive for chills, fever and malaise/fatigue.  Respiratory:  Negative for shortness of breath and wheezing.   Cardiovascular:  Negative for chest pain and palpitations.  Gastrointestinal:  Negative for abdominal pain, constipation, diarrhea, nausea and vomiting.  Genitourinary:  Negative for dysuria, frequency and urgency.       No vaginal  bleeding or discharge  Musculoskeletal:  Negative for back pain and joint pain.  Neurological:  Negative for tingling and sensory change.  All other systems reviewed and are negative.  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 She has never used smokeless tobacco. She reports that she does not drink alcohol and does not use drugs.  Allergies:  Allergies  Allergen Reactions   Penicillins Other (See Comments)    Unknown childhood reaction Has patient had a PCN reaction causing immediate rash, facial/tongue/throat swelling, SOB or lightheadedness with hypotension: Unknown Has patient had a PCN reaction causing severe rash involving mucus membranes or skin necrosis: Unknown Has patient had a PCN reaction that required hospitalization: Unknown Has patient had a PCN reaction occurring within the last 10 years: No If all of the above answers are "NO", then may proceed with Cephalosporin use.    Dapagliflozin-Metformin Hcl Er Diarrhea and Other (See Comments)   Metformin Hcl Diarrhea and Other (See Comments)      Medications Prior to Admission  Medication Sig Dispense Refill   blood glucose meter kit and supplies KIT Dispense based on patient and insurance preference. Use up to four times daily as directed. (FOR ICD-9 250.00, 250.01). 1 each 0   glimepiride (AMARYL) 1 MG tablet Take 1 mg by mouth 3 (three) times daily.     Olmesartan-amLODIPine-HCTZ 40-10-25 MG TABS TAKE ONE TABLET BY MOUTH DAILY 90 tablet 0   spironolactone  (ALDACTONE) 100 MG tablet TAKE ONE TABLET BY MOUTH DAILY 90 tablet 0    Blood pressure 99/83, pulse (!) 45, temperature 100 F (37.8 C), temperature source Oral, resp. rate 18, height 5' 11" (1.803 m), weight (!) 171.1 kg, last menstrual period 08/10/2006, SpO2 98 %. Physical Exam:  General: pleasant, WD, morbidly obese female who is laying in bed in NAD HEENT: head is normocephalic, atraumatic.  Sclera are noninjected.  PERRL.  Ears and nose without any masses or lesions.  Mouth is pink and moist Heart: regular, rate, and rhythm.  Normal s1,s2. No obvious murmurs, gallops, or rubs noted.  Palpable radial and pedal pulses bilaterally Lungs: CTAB, no wheezes, rhonchi, or rales noted.  Respiratory effort nonlabored Abd: soft, NT, ND, no masses, hernias, or organomegaly GU: erythema and induration of left labia extending to mons pubis with some ttp, possible areas of fluctuance superiorly  MS: all 4 extremities are symmetrical with no cyanosis, clubbing, or edema. Skin: warm and dry with no masses, lesions, or rashes Neuro: Cranial nerves 2-12 grossly intact, sensation is normal throughout Psych: A&Ox3 with an appropriate affect.   Results for orders placed or performed during the hospital encounter of 12/16/20 (from the past 48 hour(s))  Glucose, capillary     Status: Abnormal   Collection Time: 12/17/20  4:16 PM  Result Value Ref Range   Glucose-Capillary 136 (H) 70 - 99 mg/dL    Comment: Glucose reference range applies only to samples taken after fasting for at least 8 hours.  Glucose, capillary     Status: Abnormal   Collection Time: 12/17/20  8:19 PM  Result Value Ref Range   Glucose-Capillary 166 (H) 70 - 99 mg/dL    Comment: Glucose reference range applies only to samples taken after fasting for at least 8 hours.  Basic metabolic panel     Status: Abnormal   Collection Time: 12/18/20  3:24 AM  Result Value Ref Range   Sodium 132 (L) 135 - 145 mmol/L   Potassium 4.8 3.5 - 5.1  mmol/L   Chloride 102 98 - 111 mmol/L   CO2 22 22 - 32 mmol/L   Glucose, Bld 250 (H) 70 - 99 mg/dL    Comment: Glucose reference range applies only to samples taken after fasting for at least 8 hours.   BUN 30 (H) 6 - 20 mg/dL   Creatinine, Ser 1.56 (H) 0.44 - 1.00 mg/dL   Calcium 9.8 8.9 - 10.3 mg/dL   GFR, Estimated 39 (L) >60 mL/min    Comment: (NOTE) Calculated using the CKD-EPI Creatinine Equation (2021)    Anion gap 8 5 - 15    Comment: Performed at [facility], [place] [address], [place], [place] [address]  CBC     Status: Abnormal   Collection Time: 12/18/20  3:24 AM  Result Value Ref Range   WBC 18.8 (H) 4.0 - 10.5 K/uL   RBC 3.89 3.87 - 5.11 MIL/uL   Hemoglobin 11.8 (L) 12.0 - 15.0 g/dL   HCT 37.3 36.0 -  46.0 %   MCV 95.9 80.0 - 100.0 fL   MCH 30.3 26.0 - 34.0 pg   MCHC 31.6 30.0 - 36.0 g/dL   RDW 12.5 11.5 - 15.5 %   Platelets 145 (L) 150 - 400 K/uL   nRBC 0.0 0.0 - 0.2 %    Comment: Performed at [facility], [address] [place], [place] [address]  Hemoglobin A1c     Status: Abnormal   Collection Time: 12/18/20  3:24 AM  Result Value Ref Range   Hgb A1c MFr Bld 8.0 (H) 4.8 - 5.6 %    Comment: (NOTE) Pre diabetes:          5.7%-6.4%  Diabetes:              >6.4%  Glycemic control for   <7.0% adults with diabetes    Mean Plasma Glucose 182.9 mg/dL    Comment: Performed at [facility], [address] [place], [place] [address]  Glucose, capillary     Status: Abnormal   Collection Time: 12/18/20  7:20 AM  Result Value Ref Range   Glucose-Capillary 237 (H) 70 - 99 mg/dL    Comment: Glucose reference range applies only to samples taken after fasting for at least 8 hours.  Glucose, capillary     Status: Abnormal   Collection Time: 12/18/20 11:32 AM  Result Value Ref Range   Glucose-Capillary 202 (H) 70 - 99 mg/dL    Comment: Glucose reference range applies only to samples taken after fasting for at least 8 hours.   Glucose, capillary     Status: Abnormal   Collection Time: 12/18/20  4:44 PM  Result Value Ref Range   Glucose-Capillary 162 (H) 70 - 99 mg/dL    Comment: Glucose reference range applies only to samples taken after fasting for at least 8 hours.  Glucose, capillary     Status: Abnormal   Collection Time: 12/18/20  7:28 PM  Result Value Ref Range   Glucose-Capillary 183 (H) 70 - 99 mg/dL    Comment: Glucose reference range applies only to samples taken after fasting for at least 8 hours.  Basic metabolic panel     Status: Abnormal   Collection Time: 12/19/20  3:28 AM  Result Value Ref Range   Sodium 135 135 - 145 mmol/L   Potassium 4.7 3.5 - 5.1 mmol/L   Chloride 103 98 - 111 mmol/L   CO2 23 22 - 32 mmol/L   Glucose, Bld 180 (H) 70 - 99 mg/dL    Comment: Glucose reference range applies only to samples taken after fasting for at least 8 hours.   BUN 27 (H) 6 - 20 mg/dL   Creatinine, Ser 1.49 (H) 0.44 - 1.00 mg/dL   Calcium 10.0 8.9 - 10.3 mg/dL   GFR, Estimated 41 (L) >60 mL/min    Comment: (NOTE) Calculated using the CKD-EPI Creatinine Equation (2021)    Anion gap 9 5 - 15    Comment: Performed at [facility], [address] [place], [place] [address]  CBC     Status: Abnormal   Collection Time: 12/19/20  3:28 AM  Result Value Ref Range   WBC 16.3 (H) 4.0 - 10.5 K/uL   RBC 3.54 (L) 3.87 - 5.11 MIL/uL   Hemoglobin 10.7 (L) 12.0 - 15.0 g/dL   HCT 33.9 (L) 36.0 - 46.0 %   MCV 95.8 80.0 - 100.0 fL   MCH 30.2 26.0 - 34.0 pg     MCHC 31.6 30.0 - 36.0 g/dL   RDW 12.2 11.5 - 15.5 %   Platelets 148 (L) 150 - 400 K/uL   nRBC 0.0 0.0 - 0.2 %    Comment: Performed at [facility], [address] [place], [place] [address]  Glucose, capillary     Status: Abnormal   Collection Time: 12/19/20  7:33 AM  Result Value Ref Range   Glucose-Capillary 219 (H) 70 - 99 mg/dL    Comment: Glucose reference range applies only to samples taken after fasting  for at least 8 hours.  Glucose, capillary     Status: Abnormal   Collection Time: 12/19/20 11:32 AM  Result Value Ref Range   Glucose-Capillary 205 (H) 70 - 99 mg/dL    Comment: Glucose reference range applies only to samples taken after fasting for at least 8 hours.   No results found.    Assessment/Plan L labial/mons abscess - WBC improving on BS abx however patient with worsening cellulitis and pain - will plan for I&D in the OR tomorrow  - hold lovenox, NPO after MN - patient will need wound care post-operatively   FEN: CM diet, IVF per TR[facility] VTE: last dose LMWH 7/18 ID: cefepime/vanc 7/18>>  T2DM HTN HLD Morbid obesity - BMI 52.59  Melita Villalona R Faiga Stones, PA-C [facility] 12/19/2020, 3:13 PM Please see Amion for pager number during day hours 7:00am-4:30pm   

## 2020-12-19 NOTE — Progress Notes (Signed)
Jenna Stokes  LHT:[id number] DOB: 1965/08/10 DOA: 12/16/2020 PCP: Wynn Banker, MD    Brief Narrative:  [phone] with a history of morbid obesity, DM2, HLD, and HTN who presented to [facility] with severe fatigue and chills of approximately 24 hours duration.  Her only localizing complaint was that of a "boil" in the left inguinal region that has been present for approximately 2 days.  Significant Events:  7/17 admit via [facility] 7/17 CT noting L inguinal phlegmon  Consultants:  Gen Surgery   Code Status: FULL CODE  Antimicrobials:  Cefepime 7/16 > Vanc 7/16 >  DVT prophylaxis: Lovenox   Subjective: WBC continues to trend down. Renal fxn slowly improving. Afebrile. CBG improved. Pt however states her L groin is more painful, and the infected area feels as if it is becoming larger/extending further. She denies cp, sob, n/v. Earlier today she has a period of time when she felt exhausted. She reports that a CBG below 200 is "too low for her."   Assessment & Plan:  Sepsis POA - L perineal cellulitis v/s phlegmon v/s deep abscess WBC 20.4 w/ temp of 102 at presentation, and HR up to 108 - area in L inguinal crease/thigh/perineum worrisome for phlegmon on exam - CT abdom/pelvis noted 12 x 6 cm subcutaneous stranding in the L inguinal area, as well as a 4.2 x 1.8cm area of fat stranding - cont broad spectrum abx coverage to include MRSA - body habitus makes exam very difficutly - WBC improving - on exam however affected region is definitely enlarging w/ signif worsening in tenderness - I have asked Gen Surg to see her    Acute kidney injury Crt 1.75 at presentation w/ no prior hx of CKD - crt improving w/ volume resusciation - monitor trend - cont IVF for now   Recent Labs  Lab 12/16/20 1558 12/17/20 1020 12/18/20 0324 12/19/20 0328  CREATININE 1.75* 1.79* 1.56* 1.49*     Uncontrolled DM CBG remains variable - adjust tx for tight control in setting  of acute infection - counseled patient that our goal is for CBG < 180, and ideally closer to 120   HTN Blood pressure currently controlled   HLD Continue home medical therapy   Morbid Obesity - Body mass index is 52.59 kg/m.    Family Communication:  Status is: Inpatient  Remains inpatient appropriate because:Inpatient level of care appropriate due to severity of illness  Dispo: The patient is from: Home              Anticipated d/c is to: Home              Patient currently is not medically stable to d/c.   Difficult to place patient No   Objective: Blood pressure 130/73, pulse 81, temperature 100.2 F (37.9 C), temperature source Oral, resp. rate 20, height 5\' 11"  (1.803 m), weight (!) 171.1 kg, last menstrual period 08/10/2006, SpO2 100 %.  Intake/Output Summary (Last 24 hours) at 12/19/2020 0841 Last data filed at 12/19/2020 0602 Gross per 24 hour  Intake 2214.18 ml  Output --  Net 2214.18 ml    Filed Weights   12/17/20 1025  Weight: (!) 171.1 kg    Examination: General: No acute respiratory distress Lungs: CTA B Cardiovascular: RRR without murmur Abdomen: Morbidly obese, soft, BS + GU: Left groin w/ larger area of induration today, particullary on the medial aspect of the inguinal crease and approaching the mons pubis/labia majora - much  more tender to touch today Extremities: Chronic appearing B LE edema   CBC: Recent Labs  Lab 12/16/20 1558 12/17/20 1020 12/18/20 0324 12/19/20 0328  WBC 20.4* 20.3* 18.8* 16.3*  NEUTROABS 16.6*  --   --   --   HGB 14.0 11.8* 11.8* 10.7*  HCT 43.3 36.0 37.3 33.9*  MCV 92.5 94.5 95.9 95.8  PLT 180 144* 145* 148*    Basic Metabolic Panel: Recent Labs  Lab 12/17/20 1020 12/18/20 0324 12/19/20 0328  NA 132* 132* 135  K 4.6 4.8 4.7  CL 105 102 103  CO2 22 22 23   GLUCOSE 286* 250* 180*  BUN 30* 30* 27*  CREATININE 1.79* 1.56* 1.49*  CALCIUM 9.6 9.8 10.0  MG 1.9  --   --   PHOS 2.1*  --   --      GFR: Estimated Creatinine Clearance: 75.6 mL/min (A) (by C-G formula based on SCr of 1.49 mg/dL (H)).  Liver Function Tests: Recent Labs  Lab 12/17/20 1020  AST 12*  ALT 14  ALKPHOS 47  BILITOT 0.9  PROT 7.0  ALBUMIN 3.5    Recent Labs  Lab 12/16/20 1558  LIPASE 25     Coagulation Profile: Recent Labs  Lab 12/17/20 1020  INR 1.1     HbA1C: Hgb A1c MFr Bld  Date/Time Value Ref Range Status  12/18/2020 03:24 AM 8.0 (H) 4.8 - 5.6 % Final    Comment:    (NOTE) Pre diabetes:          5.7%-6.4%  Diabetes:              >6.4%  Glycemic control for   <7.0% adults with diabetes   09/15/2019 12:23 PM 7.8 (H) 4.6 - 6.5 % Final    Comment:    Glycemic Control Guidelines for People with Diabetes:Non Diabetic:  <6%Goal of Therapy: <7%Additional Action Suggested:  >8%     CBG: Recent Labs  Lab 12/18/20 0720 12/18/20 1132 12/18/20 1644 12/18/20 1928 12/19/20 0733  GLUCAP 237* 202* 162* 183* 219*     Recent Results (from the past 240 hour(s))  Resp Panel by RT-PCR (Flu A&B, Covid) Nasopharyngeal Swab     Status: None   Collection Time: 12/16/20  3:07 PM   Specimen: Nasopharyngeal Swab; Nasopharyngeal(NP) swabs in vial transport medium  Result Value Ref Range Status   SARS Coronavirus 2 by RT PCR NEGATIVE NEGATIVE Final    Comment: (NOTE) SARS-CoV-2 target nucleic acids are NOT DETECTED.  The SARS-CoV-2 RNA is generally detectable in upper respiratory specimens during the acute phase of infection. The lowest concentration of SARS-CoV-2 viral copies this assay can detect is 138 copies/mL. A negative result does not preclude SARS-Cov-2 infection and should not be used as the sole basis for treatment or other patient management decisions. A negative result may occur with  improper specimen collection/handling, submission of specimen other than nasopharyngeal swab, presence of viral mutation(s) within the areas targeted by this assay, and inadequate number  of viral copies(<138 copies/mL). A negative result must be combined with clinical observations, patient history, and epidemiological information. The expected result is Negative.  Fact Sheet for Patients:  12/18/20  Fact Sheet for Healthcare Providers:  BloggerCourse.com  This test is no t yet approved or cleared by the [place] FDA and  has been authorized for detection and/or diagnosis of SARS-CoV-2 by FDA under an Emergency Use Authorization (EUA). This EUA will remain  in effect (meaning this test can be used) for  the duration of the COVID-19 declaration under Section 564(b)(1) of the Act, 21 U.S.C.section 360bbb-3(b)(1), unless the authorization is terminated  or revoked sooner.       Influenza A by PCR NEGATIVE NEGATIVE Final   Influenza B by PCR NEGATIVE NEGATIVE Final    Comment: (NOTE) The Xpert Xpress SARS-CoV-2/FLU/RSV plus assay is intended as an aid in the diagnosis of influenza from Nasopharyngeal swab specimens and should not be used as a sole basis for treatment. Nasal washings and aspirates are unacceptable for Xpert Xpress SARS-CoV-2/FLU/RSV testing.  Fact Sheet for Patients: BloggerCourse.com  Fact Sheet for Healthcare Providers: SeriousBroker.it  This test is not yet approved or cleared by the [place] FDA and has been authorized for detection and/or diagnosis of SARS-CoV-2 by FDA under an Emergency Use Authorization (EUA). This EUA will remain in effect (meaning this test can be used) for the duration of the COVID-19 declaration under Section 564(b)(1) of the Act, 21 U.S.C. section 360bbb-3(b)(1), unless the authorization is terminated or revoked.  Performed at [facility], [address], [place], [place] [address]   Blood culture (routine x 2)     Status: None (Preliminary result)   Collection Time: 12/16/20   7:38 PM   Specimen: BLOOD  Result Value Ref Range Status   Specimen Description   Final    BLOOD RIGHT ANTECUBITAL Performed at [facility], [address], [place], [place] [address]    Special Requests   Final    BOTTLES DRAWN AEROBIC AND ANAEROBIC Blood Culture adequate volume Performed at [facility], [address] [place], [place] [address]    Culture   Final    NO GROWTH 3 DAYS Performed at [facility], [address] [place], [place] [address]    Report Status PENDING  Incomplete  Blood culture (routine x 2)     Status: None (Preliminary result)   Collection Time: 12/16/20  7:43 PM   Specimen: BLOOD  Result Value Ref Range Status   Specimen Description   Final    BLOOD BLOOD RIGHT FOREARM Performed at [facility], [address], [place], [place] [address]    Special Requests   Final    BOTTLES DRAWN AEROBIC AND ANAEROBIC Blood Culture adequate volume Performed at [facility], [address], [place], [place] [address]    Culture   Final    NO GROWTH 3 DAYS Performed at [facility], [address], [place], [place] [address]    Report Status PENDING  Incomplete      Scheduled Meds:  enoxaparin (LOVENOX) injection  80 mg Subcutaneous Q24H   insulin aspart  0-15 Units Subcutaneous TID WC   insulin aspart  0-5 Units Subcutaneous QHS   insulin glargine  16 Units Subcutaneous Daily   senna  1 tablet Oral BID   Continuous Infusions:  ceFEPime (MAXIPIME) IV 2 g (12/19/20 0554)   lactated ringers 125 mL/hr at 12/18/20 2212   vancomycin 150 mL/hr at 12/18/20 2309     LOS: 2 days   Lonia Blood, MD [facility] Office  [phone] Pager - Text Page per Loretha Stapler  If 7PM-7AM, please contact night-coverage per Amion 12/19/2020, 8:41 AM

## 2020-12-19 NOTE — H&P (View-Only) (Signed)
Consult Note  MIYO AINA [birth year]  [id number].    Requesting MD: Dr. Joette Catching Chief Complaint/Reason for Consult: Groin abscess  HPI:  Patient is a 55 year old female with T2DM, HTN, HLD, and morbid obesity who is currently admitted with sepsis thought to be from left groin cellulitis/abscess. Patient reports feeling a small lump on her labia about a week ago and thought it was just a blackhead or boil and that it would get better on its own. The patient went to [place] 7/17 and was transferred here for admission. WBC was 20K on admit and CT showed small 4 cm x 2 cm fluid area that could be possible developing abscess. She was started on broad spectrum antibiotics. Leukocytosis has improved but patient still having subjective fever/chills and fatigue and feels more pain and swelling to left groin now extending from labia up to mons pubis. She denies drainage from the area. Denies chest pain, SOB, abdominal pain, n/v, diarrhea, urinary symptoms, vaginal bleeding/discharge. She is not usually on any blood thinners, has been getting weight base lovenox here (although refused dose today). She reports hx of vomiting with PCN as a child and diarrhea with metformin. She reports blood glucose at home ranges from 170-200s but she is trying to get it better controlled. She reports social alcohol use maybe 1-2x monthly and denies tobacco or illicit drug use. 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 She does have sisters and an adult son who are local to [place].   ROS: Review of Systems  Constitutional:  Positive for chills, fever and malaise/fatigue.  Respiratory:  Negative for shortness of breath and wheezing.   Cardiovascular:  Negative for chest pain and palpitations.  Gastrointestinal:  Negative for abdominal pain, constipation, diarrhea, nausea and vomiting.  Genitourinary:  Negative for dysuria, frequency and urgency.       No vaginal  bleeding or discharge  Musculoskeletal:  Negative for back pain and joint pain.  Neurological:  Negative for tingling and sensory change.  All other systems reviewed and are negative.  Family History  Problem Relation Age of Onset   Heart disease Mother    Diabetes Mother    Hypertension Sister    Diabetes Sister    Adrenal disorder Sister     Past Medical History:  Diagnosis Date   DM type 2, uncontrolled, with renal complications ([place]) 11/28/3660   Hyperlipidemia    Hypertension    Morbid obesity ([place])     Past Surgical History:  Procedure Laterality Date   ABDOMINAL HYSTERECTOMY     CESAREAN SECTION     TONSILLECTOMY      Social History:  reports that she has never smoked. She has never used smokeless tobacco. She reports that she does not drink alcohol and does not use drugs.  Allergies:  Allergies  Allergen Reactions   Penicillins Other (See Comments)    Unknown childhood reaction Has patient had a PCN reaction causing immediate rash, facial/tongue/throat swelling, SOB or lightheadedness with hypotension: Unknown Has patient had a PCN reaction causing severe rash involving mucus membranes or skin necrosis: Unknown Has patient had a PCN reaction that required hospitalization: Unknown Has patient had a PCN reaction occurring within the last 10 years: No If all of the above answers are "NO", then may proceed with Cephalosporin use.    Dapagliflozin-Metformin Hcl Er Diarrhea and Other (See Comments)   Metformin Hcl Diarrhea and Other (See Comments)  Medications Prior to Admission  Medication Sig Dispense Refill   blood glucose meter kit and supplies KIT Dispense based on patient and insurance preference. Use up to four times daily as directed. (FOR ICD-9 250.00, 250.01). 1 each 0   glimepiride (AMARYL) 1 MG tablet Take 1 mg by mouth 3 (three) times daily.     Olmesartan-amLODIPine-HCTZ 40-10-25 MG TABS TAKE ONE TABLET BY MOUTH DAILY 90 tablet 0   spironolactone  (ALDACTONE) 100 MG tablet TAKE ONE TABLET BY MOUTH DAILY 90 tablet 0    Blood pressure 99/83, pulse (!) 45, temperature 100 F (37.8 C), temperature source Oral, resp. rate 18, height 5' 11" (1.803 m), weight (!) 171.1 kg, last menstrual period 08/10/2006, SpO2 98 %. Physical Exam:  General: pleasant, WD, morbidly obese female who is laying in bed in NAD HEENT: head is normocephalic, atraumatic.  Sclera are noninjected.  PERRL.  Ears and nose without any masses or lesions.  Mouth is pink and moist Heart: regular, rate, and rhythm.  Normal s1,s2. No obvious murmurs, gallops, or rubs noted.  Palpable radial and pedal pulses bilaterally Lungs: CTAB, no wheezes, rhonchi, or rales noted.  Respiratory effort nonlabored Abd: soft, NT, ND, no masses, hernias, or organomegaly GU: erythema and induration of left labia extending to mons pubis with some ttp, possible areas of fluctuance superiorly  MS: all 4 extremities are symmetrical with no cyanosis, clubbing, or edema. Skin: warm and dry with no masses, lesions, or rashes Neuro: Cranial nerves 2-12 grossly intact, sensation is normal throughout Psych: A&Ox3 with an appropriate affect.   Results for orders placed or performed during the hospital encounter of 12/16/20 (from the past 48 hour(s))  Glucose, capillary     Status: Abnormal   Collection Time: 12/17/20  4:16 PM  Result Value Ref Range   Glucose-Capillary 136 (H) 70 - 99 mg/dL    Comment: Glucose reference range applies only to samples taken after fasting for at least 8 hours.  Glucose, capillary     Status: Abnormal   Collection Time: 12/17/20  8:19 PM  Result Value Ref Range   Glucose-Capillary 166 (H) 70 - 99 mg/dL    Comment: Glucose reference range applies only to samples taken after fasting for at least 8 hours.  Basic metabolic panel     Status: Abnormal   Collection Time: 12/18/20  3:24 AM  Result Value Ref Range   Sodium 132 (L) 135 - 145 mmol/L   Potassium 4.8 3.5 - 5.1  mmol/L   Chloride 102 98 - 111 mmol/L   CO2 22 22 - 32 mmol/L   Glucose, Bld 250 (H) 70 - 99 mg/dL    Comment: Glucose reference range applies only to samples taken after fasting for at least 8 hours.   BUN 30 (H) 6 - 20 mg/dL   Creatinine, Ser 1.56 (H) 0.44 - 1.00 mg/dL   Calcium 9.8 8.9 - 10.3 mg/dL   GFR, Estimated 39 (L) >60 mL/min    Comment: (NOTE) Calculated using the CKD-EPI Creatinine Equation (2021)    Anion gap 8 5 - 15    Comment: Performed at [facility], [place] [address], [place], [place] [address]  CBC     Status: Abnormal   Collection Time: 12/18/20  3:24 AM  Result Value Ref Range   WBC 18.8 (H) 4.0 - 10.5 K/uL   RBC 3.89 3.87 - 5.11 MIL/uL   Hemoglobin 11.8 (L) 12.0 - 15.0 g/dL   HCT 37.3 36.0 -  46.0 %   MCV 95.9 80.0 - 100.0 fL   MCH 30.3 26.0 - 34.0 pg   MCHC 31.6 30.0 - 36.0 g/dL   RDW 12.5 11.5 - 15.5 %   Platelets 145 (L) 150 - 400 K/uL   nRBC 0.0 0.0 - 0.2 %    Comment: Performed at [facility], [address] [place], [place] [address]  Hemoglobin A1c     Status: Abnormal   Collection Time: 12/18/20  3:24 AM  Result Value Ref Range   Hgb A1c MFr Bld 8.0 (H) 4.8 - 5.6 %    Comment: (NOTE) Pre diabetes:          5.7%-6.4%  Diabetes:              >6.4%  Glycemic control for   <7.0% adults with diabetes    Mean Plasma Glucose 182.9 mg/dL    Comment: Performed at [facility], [address] , [place] [address]  Glucose, capillary     Status: Abnormal   Collection Time: 12/18/20  7:20 AM  Result Value Ref Range   Glucose-Capillary 237 (H) 70 - 99 mg/dL    Comment: Glucose reference range applies only to samples taken after fasting for at least 8 hours.  Glucose, capillary     Status: Abnormal   Collection Time: 12/18/20 11:32 AM  Result Value Ref Range   Glucose-Capillary 202 (H) 70 - 99 mg/dL    Comment: Glucose reference range applies only to samples taken after fasting for at least 8 hours.   Glucose, capillary     Status: Abnormal   Collection Time: 12/18/20  4:44 PM  Result Value Ref Range   Glucose-Capillary 162 (H) 70 - 99 mg/dL    Comment: Glucose reference range applies only to samples taken after fasting for at least 8 hours.  Glucose, capillary     Status: Abnormal   Collection Time: 12/18/20  7:28 PM  Result Value Ref Range   Glucose-Capillary 183 (H) 70 - 99 mg/dL    Comment: Glucose reference range applies only to samples taken after fasting for at least 8 hours.  Basic metabolic panel     Status: Abnormal   Collection Time: 12/19/20  3:28 AM  Result Value Ref Range   Sodium 135 135 - 145 mmol/L   Potassium 4.7 3.5 - 5.1 mmol/L   Chloride 103 98 - 111 mmol/L   CO2 23 22 - 32 mmol/L   Glucose, Bld 180 (H) 70 - 99 mg/dL    Comment: Glucose reference range applies only to samples taken after fasting for at least 8 hours.   BUN 27 (H) 6 - 20 mg/dL   Creatinine, Ser 1.49 (H) 0.44 - 1.00 mg/dL   Calcium 10.0 8.9 - 10.3 mg/dL   GFR, Estimated 41 (L) >60 mL/min    Comment: (NOTE) Calculated using the CKD-EPI Creatinine Equation (2021)    Anion gap 9 5 - 15    Comment: Performed at [facility], [address] , [place] [address]  CBC     Status: Abnormal   Collection Time: 12/19/20  3:28 AM  Result Value Ref Range   WBC 16.3 (H) 4.0 - 10.5 K/uL   RBC 3.54 (L) 3.87 - 5.11 MIL/uL   Hemoglobin 10.7 (L) 12.0 - 15.0 g/dL   HCT 33.9 (L) 36.0 - 46.0 %   MCV 95.8 80.0 - 100.0 fL   MCH 30.2 26.0 - 34.0 pg     MCHC 31.6 30.0 - 36.0 g/dL   RDW 12.2 11.5 - 15.5 %   Platelets 148 (L) 150 - 400 K/uL   nRBC 0.0 0.0 - 0.2 %    Comment: Performed at [facility], [address] [place],  [address]  Glucose, capillary     Status: Abnormal   Collection Time: 12/19/20  7:33 AM  Result Value Ref Range   Glucose-Capillary 219 (H) 70 - 99 mg/dL    Comment: Glucose reference range applies only to samples taken after fasting  for at least 8 hours.  Glucose, capillary     Status: Abnormal   Collection Time: 12/19/20 11:32 AM  Result Value Ref Range   Glucose-Capillary 205 (H) 70 - 99 mg/dL    Comment: Glucose reference range applies only to samples taken after fasting for at least 8 hours.   No results found.    Assessment/Plan L labial/mons abscess - WBC improving on BS abx however patient with worsening cellulitis and pain - will plan for I&D in the OR tomorrow  - hold lovenox, NPO after MN - patient will need wound care post-operatively   FEN: CM diet, IVF per TRH VTE: last dose LMWH 7/18 ID: [email or web address] 7/18>>  T2DM HTN HLD Morbid obesity - BMI 52.59  Kelly R Johnson, PA-C [facility] 12/19/2020, 3:13 PM Please see Amion for pager number during day hours 7:00am-4:30pm   

## 2020-12-20 ENCOUNTER — Encounter (HOSPITAL_COMMUNITY)
Admission: EM | Disposition: A | Discharge status: Home Health | Payer: Self-pay | Source: Home / Self Care | Attending: Internal Medicine

## 2020-12-20 ENCOUNTER — Inpatient Hospital Stay (HOSPITAL_COMMUNITY): Payer: BC Managed Care – PPO | Admitting: Certified Registered"

## 2020-12-20 ENCOUNTER — Encounter (HOSPITAL_COMMUNITY): Payer: Self-pay | Admitting: Internal Medicine

## 2020-12-20 DIAGNOSIS — L03818 Cellulitis of other sites: Secondary | ICD-10-CM | POA: Diagnosis not present

## 2020-12-20 DIAGNOSIS — N179 Acute kidney failure, unspecified: Secondary | ICD-10-CM | POA: Diagnosis not present

## 2020-12-20 DIAGNOSIS — A419 Sepsis, unspecified organism: Secondary | ICD-10-CM | POA: Diagnosis not present

## 2020-12-20 HISTORY — PX: INCISION AND DRAINAGE ABSCESS: SHX5864

## 2020-12-20 LAB — CBC
HCT: 32.3 % — ABNORMAL LOW (ref 36.0–46.0)
Hemoglobin: 10.2 g/dL — ABNORMAL LOW (ref 12.0–15.0)
MCH: 30 pg (ref 26.0–34.0)
MCHC: 31.6 g/dL (ref 30.0–36.0)
MCV: 95 fL (ref 80.0–100.0)
Platelets: 158 10*3/uL (ref 150–400)
RBC: 3.4 MIL/uL — ABNORMAL LOW (ref 3.87–5.11)
RDW: 12.2 % (ref 11.5–15.5)
WBC: 19.4 10*3/uL — ABNORMAL HIGH (ref 4.0–10.5)
nRBC: 0 % (ref 0.0–0.2)

## 2020-12-20 LAB — GLUCOSE, CAPILLARY
Glucose-Capillary: 164 mg/dL — ABNORMAL HIGH (ref 70–99)
Glucose-Capillary: 143 mg/dL — ABNORMAL HIGH (ref 70–99)
Glucose-Capillary: 228 mg/dL — ABNORMAL HIGH (ref 70–99)
Glucose-Capillary: 196 mg/dL — ABNORMAL HIGH (ref 70–99)

## 2020-12-20 LAB — VANCOMYCIN, TROUGH: Vancomycin Tr: 11 ug/mL — ABNORMAL LOW (ref 15–20)

## 2020-12-20 LAB — SURGICAL PCR SCREEN
MRSA, PCR: NEGATIVE
Staphylococcus aureus: NEGATIVE

## 2020-12-20 LAB — BASIC METABOLIC PANEL
Anion gap: 11 (ref 5–15)
BUN: 20 mg/dL (ref 6–20)
CO2: 21 mmol/L — ABNORMAL LOW (ref 22–32)
Calcium: 10 mg/dL (ref 8.9–10.3)
Chloride: 104 mmol/L (ref 98–111)
Creatinine, Ser: 1.11 mg/dL — ABNORMAL HIGH (ref 0.44–1.00)
GFR, Estimated: 59 mL/min — ABNORMAL LOW (ref >60)
Glucose, Bld: 183 mg/dL — ABNORMAL HIGH (ref 70–99)
Potassium: 4.6 mmol/L (ref 3.5–5.1)
Sodium: 136 mmol/L (ref 135–145)

## 2020-12-20 SURGERY — INCISION AND DRAINAGE, ABSCESS
Anesthesia: General | Laterality: Left

## 2020-12-20 MED ORDER — ONDANSETRON HCL 4 MG/2ML IJ SOLN
INTRAMUSCULAR | Status: DC | PRN
  Administered 2020-12-20: 4 mg via INTRAVENOUS

## 2020-12-20 MED ORDER — DEXAMETHASONE SODIUM PHOSPHATE 10 MG/ML IJ SOLN
INTRAMUSCULAR | Status: AC
  Filled 2020-12-20: qty 1

## 2020-12-20 MED ORDER — ORAL CARE MOUTH RINSE: 15.0000 mL | Freq: Once | OROMUCOSAL | Status: AC

## 2020-12-20 MED ORDER — PROPOFOL 10 MG/ML IV BOLUS
INTRAVENOUS | Status: DC | PRN
  Administered 2020-12-20: 160 mg via INTRAVENOUS

## 2020-12-20 MED ORDER — 0.9 % SODIUM CHLORIDE (POUR BTL) OPTIME
TOPICAL | Status: DC | PRN
  Administered 2020-12-20: 1000 mL

## 2020-12-20 MED ORDER — FENTANYL CITRATE (PF) 100 MCG/2ML IJ SOLN: 25.0000 ug | INTRAMUSCULAR | Status: DC | PRN

## 2020-12-20 MED ORDER — CHLORHEXIDINE GLUCONATE 0.12 % MT SOLN
15.0000 mL | Freq: Once | OROMUCOSAL | Status: AC
  Administered 2020-12-20: 15 mL via OROMUCOSAL

## 2020-12-20 MED ORDER — MIDAZOLAM HCL 5 MG/5ML IJ SOLN
INTRAMUSCULAR | Status: DC | PRN
  Administered 2020-12-20: 2 mg via INTRAVENOUS

## 2020-12-20 MED ORDER — MORPHINE SULFATE (PF) 4 MG/ML IV SOLN: 1.0000 mg | INTRAVENOUS | Status: DC | PRN

## 2020-12-20 MED ORDER — MIDAZOLAM HCL 2 MG/2ML IJ SOLN
INTRAMUSCULAR | Status: AC
  Filled 2020-12-20: qty 2

## 2020-12-20 MED ORDER — DEXMEDETOMIDINE (PRECEDEX) IN NS 20 MCG/5ML (4 MCG/ML) IV SYRINGE
PREFILLED_SYRINGE | INTRAVENOUS | Status: AC
  Filled 2020-12-20: qty 5

## 2020-12-20 MED ORDER — PROPOFOL 10 MG/ML IV BOLUS
INTRAVENOUS | Status: AC
  Filled 2020-12-20: qty 20

## 2020-12-20 MED ORDER — LIDOCAINE 2% (20 MG/ML) 5 ML SYRINGE
INTRAMUSCULAR | Status: DC | PRN
  Administered 2020-12-20: 100 mg via INTRAVENOUS

## 2020-12-20 MED ORDER — DEXMEDETOMIDINE (PRECEDEX) IN NS 20 MCG/5ML (4 MCG/ML) IV SYRINGE
PREFILLED_SYRINGE | INTRAVENOUS | Status: DC | PRN
  Administered 2020-12-20: 4 ug via INTRAVENOUS
  Administered 2020-12-20: 8 ug via INTRAVENOUS
  Administered 2020-12-20: 4 ug via INTRAVENOUS

## 2020-12-20 MED ORDER — LIDOCAINE 2% (20 MG/ML) 5 ML SYRINGE
INTRAMUSCULAR | Status: AC
  Filled 2020-12-20: qty 5

## 2020-12-20 MED ORDER — FENTANYL CITRATE (PF) 100 MCG/2ML IJ SOLN
INTRAMUSCULAR | Status: DC | PRN
  Administered 2020-12-20: 25 ug via INTRAVENOUS
  Administered 2020-12-20: 50 ug via INTRAVENOUS
  Administered 2020-12-20: 25 ug via INTRAVENOUS

## 2020-12-20 MED ORDER — BUPIVACAINE-EPINEPHRINE 0.25% -1:200000 IJ SOLN
INTRAMUSCULAR | Status: DC | PRN
  Administered 2020-12-20: 5 mL

## 2020-12-20 MED ORDER — ONDANSETRON HCL 4 MG/2ML IJ SOLN
INTRAMUSCULAR | Status: AC
  Filled 2020-12-20: qty 2

## 2020-12-20 MED ORDER — FENTANYL CITRATE (PF) 100 MCG/2ML IJ SOLN
INTRAMUSCULAR | Status: AC
  Filled 2020-12-20: qty 2

## 2020-12-20 MED ORDER — LACTATED RINGERS IV SOLN: INTRAVENOUS | Status: DC

## 2020-12-20 MED ORDER — BUPIVACAINE-EPINEPHRINE (PF) 0.25% -1:200000 IJ SOLN
INTRAMUSCULAR | Status: AC
  Filled 2020-12-20: qty 30

## 2020-12-20 SURGICAL SUPPLY — 29 items
BAG COUNTER SPONGE SURGICOUNT (BAG) IMPLANT
BAG SPNG CNTER NS LX DISP (BAG)
BLADE SURG 15 STRL LF DISP TIS (BLADE) ×1 IMPLANT
BLADE SURG 15 STRL SS (BLADE) ×2
BNDG GAUZE ELAST 4 BULKY (GAUZE/BANDAGES/DRESSINGS) IMPLANT
DECANTER SPIKE VIAL GLASS SM (MISCELLANEOUS) ×1 IMPLANT
DRAPE LAPAROSCOPIC ABDOMINAL (DRAPES) IMPLANT
DRSG PAD ABDOMINAL 8X10 ST (GAUZE/BANDAGES/DRESSINGS) IMPLANT
ELECT REM PT RETURN 15FT ADLT (MISCELLANEOUS) ×2 IMPLANT
GAUZE SPONGE 4X4 12PLY STRL (GAUZE/BANDAGES/DRESSINGS) ×1 IMPLANT
GLOVE SURG ENC MOIS LTX SZ7.5 (GLOVE) ×2 IMPLANT
GOWN STRL REUS W/TWL LRG LVL3 (GOWN DISPOSABLE) ×4 IMPLANT
KIT BASIN OR (CUSTOM PROCEDURE TRAY) ×2 IMPLANT
KIT TURNOVER KIT A (KITS) ×2 IMPLANT
NDL HYPO 25X1 1.5 SAFETY (NEEDLE) IMPLANT
NEEDLE HYPO 25X1 1.5 SAFETY (NEEDLE) IMPLANT
NS IRRIG 1000ML POUR BTL (IV SOLUTION) ×2 IMPLANT
PENCIL SMOKE EVACUATOR (MISCELLANEOUS) IMPLANT
SPONGE T-LAP 18X18 ~~LOC~~+RFID (SPONGE) ×2 IMPLANT
SUT MNCRL AB 4-0 PS2 18 (SUTURE) IMPLANT
SUT VIC AB 3-0 SH 27 (SUTURE)
SUT VIC AB 3-0 SH 27XBRD (SUTURE) IMPLANT
SWAB COLLECTION DEVICE MRSA (MISCELLANEOUS) ×2 IMPLANT
SWAB CULTURE ESWAB REG 1ML (MISCELLANEOUS) IMPLANT
SYR BULB EAR ULCER 3OZ GRN STR (SYRINGE) ×2 IMPLANT
SYR CONTROL 10ML LL (SYRINGE) ×1 IMPLANT
TAPE CLOTH SURG 4X10 WHT LF (GAUZE/BANDAGES/DRESSINGS) ×1 IMPLANT
TOWEL OR 17X26 10 PK STRL BLUE (TOWEL DISPOSABLE) ×2 IMPLANT
YANKAUER SUCT BULB TIP NO VENT (SUCTIONS) ×2 IMPLANT

## 2020-12-20 NOTE — Anesthesia Preprocedure Evaluation (Addendum)
Anesthesia Evaluation  Patient identified by MRN, date of birth, ID band Patient awake    Reviewed: Allergy & Precautions, NPO status   Airway Mallampati: II  TM Distance: >3 FB     Dental   Pulmonary sleep apnea ,    breath sounds clear to auscultation       Cardiovascular hypertension,  Rhythm:Regular Rate:Normal     Neuro/Psych    GI/Hepatic negative GI ROS, Neg liver ROS,   Endo/Other  diabetes  Renal/GU Renal disease     Musculoskeletal   Abdominal   Peds  Hematology   Anesthesia Other Findings   Reproductive/Obstetrics                             Anesthesia Physical Anesthesia Plan  ASA: 3  Anesthesia Plan: General   Post-op Pain Management:    Induction: Intravenous  PONV Risk Score and Plan: 3 and Ondansetron, Dexamethasone and Midazolam  Airway Management Planned: LMA  Additional Equipment:   Intra-op Plan:   Post-operative Plan: Extubation in OR  Informed Consent: I have reviewed the patients History and Physical, chart, labs and discussed the procedure including the risks, benefits and alternatives for the proposed anesthesia with the patient or authorized representative who has indicated his/her understanding and acceptance.     Dental advisory given  Plan Discussed with: CRNA and Anesthesiologist  Anesthesia Plan Comments:         Anesthesia Quick Evaluation

## 2020-12-20 NOTE — Op Note (Signed)
12/20/2020  10:59 AM  PATIENT:  Jenna Stokes  55 y.o. female  PRE-OPERATIVE DIAGNOSIS:  LEFT LABIAL ABSCESS  POST-OPERATIVE DIAGNOSIS:  LEFT LABIAL ABSCESS  PROCEDURE:  Procedure(s): INCISION AND DRAINAGE LEFT LABIAL ABSCESS (Left)  SURGEON:  Surgeon(s) and Role:    * Griselda Miner, MD - Primary  PHYSICIAN ASSISTANT:   ASSISTANTS: none   ANESTHESIA:   local and general  EBL:  minimal   BLOOD ADMINISTERED:none  DRAINS: none   LOCAL MEDICATIONS USED:  MARCAINE     SPECIMEN:  No Specimen  DISPOSITION OF SPECIMEN:  N/A  COUNTS:  YES  TOURNIQUET:  * No tourniquets in log *  DICTATION: .Dragon Dictation  After informed consent was obtained the patient was brought to the operating room and placed in the supine position on the operating table.  After adequate induction of general anesthesia the patient was moved in the lithotomy position and all pressure points were padded.  The perineal area was then prepped with Betadine and draped in usual sterile manner.  There was a large area of induration and fluctuance in the left labia.  This area was infiltrated with quarter percent Marcaine.  A vertically oriented incision was made overlying the area of fluctuance with a 15 blade knife.  The incision was carried through the skin and subcutaneous tissue sharply with the electrocautery until the abscess cavity was entered.  Cultures were obtained.  A large amount of purulent fluid was evacuated.  A finger was used to break up all the loculations so that the abscess cavity appeared to be completely evacuated.  Hemostasis was achieved using the Bovie electrocautery.  Once the wound was clean and dry then packed it with a moistened Kerlix gauze.  Sterile dressings were then applied.  The patient tolerated the procedure well.  At the end of the case all needle sponge and instrument counts were correct.  The patient was then awakened and taken recovery in stable condition.  PLAN OF CARE:  Admit to inpatient   PATIENT DISPOSITION:  PACU - hemodynamically stable.   Delay start of Pharmacological VTE agent (>24hrs) due to surgical blood loss or risk of bleeding: no

## 2020-12-20 NOTE — Progress Notes (Signed)
Pt refusing Humalog. FSBS 228. MD aware. Will continue to monitor for changes.

## 2020-12-20 NOTE — Interval H&P Note (Signed)
History and Physical Interval Note:  12/20/2020 8:19 AM  Jenna Stokes  has presented today for surgery, with the diagnosis of LEFT LABIAL ABSCESS.  The various methods of treatment have been discussed with the patient and family. After consideration of risks, benefits and other options for treatment, the patient has consented to  Procedure(s): INCISION AND DRAINAGE ABSCESS (Left) as a surgical intervention.  The patient's history has been reviewed, patient examined, no change in status, stable for surgery.  I have reviewed the patient's chart and labs.  Questions were answered to the patient's satisfaction.     Chevis Pretty III

## 2020-12-20 NOTE — Progress Notes (Addendum)
Jenna Stokes  QQP:[id number] DOB: 05/31/1966 DOA: 12/16/2020 PCP: Wynn Banker, MD    Brief Narrative:  [phone] with a history of morbid obesity, DM2, HLD, and HTN who presented to [facility] with severe fatigue and chills of approximately 24 hours duration.  Her only localizing complaint was that of a "boil" in the left inguinal region that has been present for approximately 2 days.  Significant Events:  7/17 admit via [facility] 7/17 CT noting L inguinal phlegmon 7/20 I&D in OR   Consultants:  Gen Surgery   Code Status: FULL CODE  Antimicrobials:  Cefepime 7/16 > Vanc 7/16 >  DVT prophylaxis: Lovenox   Subjective: The patient is seen in her room after returning from the OR.  She is alert and in good spirits though somewhat sleepy.  She states she feels much better after having had the abscess drained.  She reports decreased pain at present.  No shortness of breath or chest pain.  No new complaints.  Assessment & Plan:  Sepsis POA - L perineal cellulitis v/s phlegmon v/s deep abscess WBC 20.4 w/ temp of 102 at presentation, and HR up to 108 - body habitus makes exam very difficutly - despite broad ab coverage and overall clinical improvement, the infected area itself continued to worsen therefore Gen Surgery took her to the OR today for I&D - f/u culture data to narrow abx as able    Acute kidney injury Crt 1.75 at presentation w/ no prior hx of CKD - crt improving w/ volume resusciation - monitor trend - cont IVF another 24hrs while she recovers from the OR   Recent Labs  Lab 12/16/20 1558 12/17/20 1020 12/18/20 0324 12/19/20 0328 12/20/20 0321  CREATININE 1.75* 1.79* 1.56* 1.49* 1.11*     Uncontrolled DM with hyperglycemia CBG trending downward - no change in tx today - follow closely for strict control to aid wound healing    HTN Blood pressure has been variable - follow w/o change for now    HLD Continue home medical therapy    Morbid Obesity - Body mass index is 52.59 kg/m.    Family Communication: no family present  Status is: Inpatient  Remains inpatient appropriate because:Inpatient level of care appropriate due to severity of illness  Dispo: The patient is from: Home              Anticipated d/c is to: Home              Patient currently is not medically stable to d/c.   Difficult to place patient No   Objective: Blood pressure 103/87, pulse 76, temperature 98.3 F (36.8 C), temperature source Oral, resp. rate 16, height 5\' 11"  (1.803 m), weight (!) 171.1 kg, last menstrual period 08/10/2006, SpO2 100 %.  Intake/Output Summary (Last 24 hours) at 12/20/2020 0820 Last data filed at 12/20/2020 0748 Gross per 24 hour  Intake 2468.48 ml  Output 850 ml  Net 1618.48 ml    Filed Weights   12/17/20 1025  Weight: (!) 171.1 kg    Examination: General: No acute respiratory distress Lungs: CTA B - no wheezing  Cardiovascular: RRR without murmur - distant HS  Abdomen: Morbidly obese, soft, BS + GU: pt just out of OR - no exam today  Extremities: Chronic appearing B LE edema w/o change   CBC: Recent Labs  Lab 12/16/20 1558 12/17/20 1020 12/18/20 0324 12/19/20 0328 12/20/20 0321  WBC 20.4*   < > 18.8*  16.3* 19.4*  NEUTROABS 16.6*  --   --   --   --   HGB 14.0   < > 11.8* 10.7* 10.2*  HCT 43.3   < > 37.3 33.9* 32.3*  MCV 92.5   < > 95.9 95.8 95.0  PLT 180   < > 145* 148* 158   < > = values in this interval not displayed.    Basic Metabolic Panel: Recent Labs  Lab 12/17/20 1020 12/18/20 0324 12/19/20 0328 12/20/20 0321  NA 132* 132* 135 136  K 4.6 4.8 4.7 4.6  CL 105 102 103 104  CO2 22 22 23  21*  GLUCOSE 286* 250* 180* 183*  BUN 30* 30* 27* 20  CREATININE 1.79* 1.56* 1.49* 1.11*  CALCIUM 9.6 9.8 10.0 10.0  MG 1.9  --   --   --   PHOS 2.1*  --   --   --     GFR: Estimated Creatinine Clearance: 101.4 mL/min (A) (by C-G formula based on SCr of 1.11 mg/dL (H)).  Liver Function  Tests: Recent Labs  Lab 12/17/20 1020  AST 12*  ALT 14  ALKPHOS 47  BILITOT 0.9  PROT 7.0  ALBUMIN 3.5    Recent Labs  Lab 12/16/20 1558  LIPASE 25     Coagulation Profile: Recent Labs  Lab 12/17/20 1020  INR 1.1     HbA1C: Hgb A1c MFr Bld  Date/Time Value Ref Range Status  12/18/2020 03:24 AM 8.0 (H) 4.8 - 5.6 % Final    Comment:    (NOTE) Pre diabetes:          5.7%-6.4%  Diabetes:              >6.4%  Glycemic control for   <7.0% adults with diabetes   09/15/2019 12:23 PM 7.8 (H) 4.6 - 6.5 % Final    Comment:    Glycemic Control Guidelines for People with Diabetes:Non Diabetic:  <6%Goal of Therapy: <7%Additional Action Suggested:  >8%     CBG: Recent Labs  Lab 12/19/20 0733 12/19/20 1132 12/19/20 1638 12/19/20 1937 12/20/20 0714  GLUCAP 219* 205* 238* 190* 164*     Recent Results (from the past 240 hour(s))  Resp Panel by RT-PCR (Flu A&B, Covid) Nasopharyngeal Swab     Status: None   Collection Time: 12/16/20  3:07 PM   Specimen: Nasopharyngeal Swab; Nasopharyngeal(NP) swabs in vial transport medium  Result Value Ref Range Status   SARS Coronavirus 2 by RT PCR NEGATIVE NEGATIVE Final    Comment: (NOTE) SARS-CoV-2 target nucleic acids are NOT DETECTED.  The SARS-CoV-2 RNA is generally detectable in upper respiratory specimens during the acute phase of infection. The lowest concentration of SARS-CoV-2 viral copies this assay can detect is 138 copies/mL. A negative result does not preclude SARS-Cov-2 infection and should not be used as the sole basis for treatment or other patient management decisions. A negative result may occur with  improper specimen collection/handling, submission of specimen other than nasopharyngeal swab, presence of viral mutation(s) within the areas targeted by this assay, and inadequate number of viral copies(<138 copies/mL). A negative result must be combined with clinical observations, patient history, and  epidemiological information. The expected result is Negative.  Fact Sheet for Patients:  12/18/20  Fact Sheet for Healthcare Providers:  BloggerCourse.com  This test is no t yet approved or cleared by the [place] FDA and  has been authorized for detection and/or diagnosis of SARS-CoV-2 by FDA under an Emergency Use  Authorization (EUA). This EUA will remain  in effect (meaning this test can be used) for the duration of the COVID-19 declaration under Section 564(b)(1) of the Act, 21 U.S.C.section 360bbb-3(b)(1), unless the authorization is terminated  or revoked sooner.       Influenza A by PCR NEGATIVE NEGATIVE Final   Influenza B by PCR NEGATIVE NEGATIVE Final    Comment: (NOTE) The Xpert Xpress SARS-CoV-2/FLU/RSV plus assay is intended as an aid in the diagnosis of influenza from Nasopharyngeal swab specimens and should not be used as a sole basis for treatment. Nasal washings and aspirates are unacceptable for Xpert Xpress SARS-CoV-2/FLU/RSV testing.  Fact Sheet for Patients: BloggerCourse.comhttps://www.fda.gov/media/152166/download  Fact Sheet for Healthcare Providers: SeriousBroker.ithttps://www.fda.gov/media/152162/download  This test is not yet approved or cleared by the [place] FDA and has been authorized for detection and/or diagnosis of SARS-CoV-2 by FDA under an Emergency Use Authorization (EUA). This EUA will remain in effect (meaning this test can be used) for the duration of the COVID-19 declaration under Section 564(b)(1) of the Act, 21 U.S.C. section 360bbb-3(b)(1), unless the authorization is terminated or revoked.  Performed at [facility], [address], [place], [place] [address]   Blood culture (routine x 2)     Status: None (Preliminary result)   Collection Time: 12/16/20  7:38 PM   Specimen: BLOOD  Result Value Ref Range Status   Specimen Description   Final    BLOOD RIGHT  ANTECUBITAL Performed at [facility], [address], [place], [place] [address]    Special Requests   Final    BOTTLES DRAWN AEROBIC AND ANAEROBIC Blood Culture adequate volume Performed at [facility], [address], [place], [place] [address]    Culture   Final    NO GROWTH 4 DAYS Performed at [facility], [address] [place], [place] [address]    Report Status PENDING  Incomplete  Blood culture (routine x 2)     Status: None (Preliminary result)   Collection Time: 12/16/20  7:43 PM   Specimen: BLOOD  Result Value Ref Range Status   Specimen Description   Final    BLOOD BLOOD RIGHT FOREARM Performed at [facility], [address], [place], [place] [address]    Special Requests   Final    BOTTLES DRAWN AEROBIC AND ANAEROBIC Blood Culture adequate volume Performed at [facility], [address], [place], [place] [address]    Culture   Final    NO GROWTH 4 DAYS Performed at [facility], [address] [place], [place] [address]    Report Status PENDING  Incomplete  Surgical pcr screen     Status: None   Collection Time: 12/20/20  2:23 AM   Specimen: Nasal Mucosa; Nasal Swab  Result Value Ref Range Status   MRSA, PCR NEGATIVE NEGATIVE Final   Staphylococcus aureus NEGATIVE NEGATIVE Final    Comment: (NOTE) The Xpert SA Assay (FDA approved for NASAL specimens in patients 55 years of age and older), is one component of a comprehensive surveillance program. It is not intended to diagnose infection nor to guide or monitor treatment. Performed at [facility], [address] [place], [place] [address]       Scheduled Meds:  [START ON 12/21/2020] enoxaparin (LOVENOX) injection  80 mg Subcutaneous Q24H   insulin aspart  0-15 Units Subcutaneous TID WC   insulin aspart  0-5 Units Subcutaneous QHS   insulin glargine  16  Units Subcutaneous Daily   senna  1  tablet Oral BID   Continuous Infusions:  ceFEPime (MAXIPIME) IV 2 g (12/20/20 0536)   lactated ringers 75 mL/hr at 12/20/20 0350   vancomycin Stopped (12/19/20 2355)     LOS: 3 days   Lonia Blood, MD [facility] Office  [phone] Pager - Text Page per Loretha Stapler  If 7PM-7AM, please contact night-coverage per Amion 12/20/2020, 8:20 AM

## 2020-12-20 NOTE — Transfer of Care (Signed)
Immediate Anesthesia Transfer of Care Note  Patient: [name]  Procedure(s) Performed: INCISION AND DRAINAGE ABSCESS (Left)  Patient Location: PACU  Anesthesia Type:General  Level of Consciousness: awake, alert  and oriented  Airway & Oxygen Therapy: Patient Spontanous Breathing and Patient connected to face mask oxygen  Post-op Assessment: Report given to RN and Post -op Vital signs reviewed and stable  Post vital signs: Reviewed and stable  Last Vitals:  Vitals Value Taken Time  BP    Temp    Pulse 75 12/20/20 1103  Resp 19 12/20/20 1103  SpO2 100 % 12/20/20 1103  Vitals shown include unvalidated device data.  Last Pain:  Vitals:   12/20/20 0927  TempSrc: Oral  PainSc: 0-No pain      Patients Stated Pain Goal: 1 (12/19/20 2111)  Complications: No notable events documented.

## 2020-12-20 NOTE — Anesthesia Postprocedure Evaluation (Signed)
Anesthesia Post Note  Patient: Jenna Stokes  Procedure(s) Performed: INCISION AND DRAINAGE ABSCESS (Left)     Patient location during evaluation: PACU Anesthesia Type: General Level of consciousness: awake Pain management: pain level controlled Vital Signs Assessment: post-procedure vital signs reviewed and stable Respiratory status: spontaneous breathing Cardiovascular status: stable Postop Assessment: no apparent nausea or vomiting Anesthetic complications: no   No notable events documented.  Last Vitals:  Vitals:   12/20/20 1130 12/20/20 1307  BP: (!) 165/84 (!) 159/65  Pulse: 71 75  Resp: 20 18  Temp:  36.7 C  SpO2: 100% 95%    Last Pain:  Vitals:   12/20/20 1307  TempSrc: Oral  PainSc:                  Hera Celaya

## 2020-12-20 NOTE — Anesthesia Postprocedure Evaluation (Signed)
Anesthesia Post Note  Patient: Jenna Stokes  Procedure(s) Performed: INCISION AND DRAINAGE ABSCESS (Left)     Patient location during evaluation: PACU Anesthesia Type: General Level of consciousness: awake Pain management: pain level controlled Vital Signs Assessment: post-procedure vital signs reviewed and stable Respiratory status: spontaneous breathing Cardiovascular status: stable Postop Assessment: no apparent nausea or vomiting Anesthetic complications: no   No notable events documented.  Last Vitals:  Vitals:   12/20/20 0927 12/20/20 1104  BP: (!) 142/64   Pulse: 74 75  Resp: 18 19  Temp: 37.2 C 37.1 C  SpO2: 100% 100%    Last Pain:  Vitals:   12/20/20 1104  TempSrc:   PainSc: 0-No pain                 Zyla Dascenzo

## 2020-12-20 NOTE — Anesthesia Procedure Notes (Signed)
Procedure Name: LMA Insertion Date/Time: 12/20/2020 10:28 AM Performed by: Aj Crunkleton D, CRNA Pre-anesthesia Checklist: Patient identified, Emergency Drugs available, Suction available and Patient being monitored Patient Re-evaluated:Patient Re-evaluated prior to induction Oxygen Delivery Method: Circle system utilized Preoxygenation: Pre-oxygenation with 100% oxygen Induction Type: IV induction Ventilation: Mask ventilation without difficulty LMA: LMA inserted LMA Size: 4.0 Tube type: Oral Number of attempts: 1 Placement Confirmation: positive ETCO2 and breath sounds checked- equal and bilateral Tube secured with: Tape Dental Injury: Teeth and Oropharynx as per pre-operative assessment

## 2020-12-21 ENCOUNTER — Encounter (HOSPITAL_COMMUNITY): Payer: Self-pay | Admitting: General Surgery

## 2020-12-21 DIAGNOSIS — E1169 Type 2 diabetes mellitus with other specified complication: Secondary | ICD-10-CM

## 2020-12-21 DIAGNOSIS — N764 Abscess of vulva: Secondary | ICD-10-CM

## 2020-12-21 DIAGNOSIS — L03818 Cellulitis of other sites: Secondary | ICD-10-CM | POA: Diagnosis not present

## 2020-12-21 DIAGNOSIS — A419 Sepsis, unspecified organism: Secondary | ICD-10-CM | POA: Diagnosis not present

## 2020-12-21 DIAGNOSIS — E669 Obesity, unspecified: Secondary | ICD-10-CM

## 2020-12-21 LAB — BASIC METABOLIC PANEL
Anion gap: 11 (ref 5–15)
BUN: 17 mg/dL (ref 6–20)
CO2: 22 mmol/L (ref 22–32)
Calcium: 10.1 mg/dL (ref 8.9–10.3)
Chloride: 103 mmol/L (ref 98–111)
Creatinine, Ser: 0.97 mg/dL (ref 0.44–1.00)
GFR, Estimated: >60 mL/min (ref >60)
Glucose, Bld: 157 mg/dL — ABNORMAL HIGH (ref 70–99)
Potassium: 4.5 mmol/L (ref 3.5–5.1)
Sodium: 136 mmol/L (ref 135–145)

## 2020-12-21 LAB — CULTURE, BLOOD (ROUTINE X 2)
Culture: NO GROWTH
Culture: NO GROWTH
Special Requests: ADEQUATE
Special Requests: ADEQUATE

## 2020-12-21 LAB — CBC
HCT: 31.4 % — ABNORMAL LOW (ref 36.0–46.0)
Hemoglobin: 10 g/dL — ABNORMAL LOW (ref 12.0–15.0)
MCH: 30.4 pg (ref 26.0–34.0)
MCHC: 31.8 g/dL (ref 30.0–36.0)
MCV: 95.4 fL (ref 80.0–100.0)
Platelets: 177 10*3/uL (ref 150–400)
RBC: 3.29 MIL/uL — ABNORMAL LOW (ref 3.87–5.11)
RDW: 12.1 % (ref 11.5–15.5)
WBC: 17.3 10*3/uL — ABNORMAL HIGH (ref 4.0–10.5)
nRBC: 0 % (ref 0.0–0.2)

## 2020-12-21 LAB — GLUCOSE, CAPILLARY
Glucose-Capillary: 198 mg/dL — ABNORMAL HIGH (ref 70–99)
Glucose-Capillary: 197 mg/dL — ABNORMAL HIGH (ref 70–99)
Glucose-Capillary: 178 mg/dL — ABNORMAL HIGH (ref 70–99)
Glucose-Capillary: 206 mg/dL — ABNORMAL HIGH (ref 70–99)

## 2020-12-21 MED ORDER — LIP MEDEX EX OINT
TOPICAL_OINTMENT | CUTANEOUS | Status: AC
  Filled 2020-12-21: qty 7

## 2020-12-21 NOTE — Progress Notes (Signed)
Pharmacy Antibiotic Note  Jenna Stokes is a 55 y.o. female presented to the ED on 12/16/2020 with generalized weakness.  She was noted to have a boil in her inguinal region with concern for abscess.  She is currently on cefepime and vancomycin for infection.   Now s/p I and D of labial abscess on 7/20. Noted a Vancomycin trough was collected last night and came back at 11 which is within the therapeutic range of 10-20. Noted that patient had only received 1 dose of 2000 mg prior to this trough collection so expect Vanc will continue to accumulate. Scr improved from 1.11>> 0.97.   Abscess culture is currently growing gram negative rods with GPC in pairs on the gram stain.   Plan: Continue cefepime 2 g IV q8h Continue Vancomycin 2000 mg every 24 hours for now- Consider d/c soon pending culture data  Monitor renal function _________________________________________________  Height: 5\' 11"  (180.3 cm) Weight: (!) 171.1 kg (377 lb 1.6 oz) IBW/kg (Calculated) : 70.8  Temp (24hrs), Avg:98.5 F (36.9 C), Min:98 F (36.7 C), Max:99.5 F (37.5 C)  Recent Labs  Lab 12/17/20 1020 12/18/20 0324 12/19/20 0328 12/20/20 0321 12/20/20 2105 12/21/20 0332  WBC 20.3* 18.8* 16.3* 19.4*  --  17.3*  CREATININE 1.79* 1.56* 1.49* 1.11*  --  0.97  VANCOTROUGH  --   --   --   --  11*  --      Estimated Creatinine Clearance: 116.1 mL/min (by C-G formula based on SCr of 0.97 mg/dL).    Allergies  Allergen Reactions   Penicillins Other (See Comments)    Unknown childhood reaction Has patient had a PCN reaction causing immediate rash, facial/tongue/throat swelling, SOB or lightheadedness with hypotension: Unknown Has patient had a PCN reaction causing severe rash involving mucus membranes or skin necrosis: Unknown Has patient had a PCN reaction that required hospitalization: Unknown Has patient had a PCN reaction occurring within the last 10 years: No If all of the above answers are "NO", then may  proceed with Cephalosporin use.    Dapagliflozin-Metformin Hcl Er Diarrhea and Other (See Comments)   Metformin Hcl Diarrhea and Other (See Comments)    Antimicrobials this admission:  cefepime 7/16 >>  vancomycin 7/16 >>   Dose adjustments this admission:  7/19: Vanc 1500 mg q24 >> 2000 mg q24h  Microbiology results:  7/16 BCx: ngtd 7/20 abscess cx: gram negative rods   Thank you for allowing pharmacy to be a part of this patient's care.  8/20, PharmD, BCPS, BCIDP Infectious Diseases Clinical Pharmacist Phone: [phone] 12/21/2020 10:36 AM

## 2020-12-21 NOTE — Progress Notes (Signed)
1 Day Post-Op  Subjective: CC: Doing well. Had first dressing change last night. Tolerating well. Pain well controlled with current medications. Tolerating diet without n/v. No other complaints. Has not gotten out of bed yet. She has voided since surgery.   Objective: Vital signs in last 24 hours: Temp:  [98 F (36.7 C)-99.5 F (37.5 C)] 98 F (36.7 C) (07/21 0553) Pulse Rate:  [43-75] 66 (07/21 0553) Resp:  [18-20] 18 (07/20 1957) BP: (136-165)/(65-84) 139/65 (07/21 0553) SpO2:  [95 %-100 %] 97 % (07/21 0553) Last BM Date: 12/16/20  Intake/Output from previous day: 07/20 0701 - 07/21 0700 In: 2799.2 [P.O.:940; I.V.:1096.3; IV Piggyback:763] Out: 1650 [Urine:1650] Intake/Output this shift: No intake/output data recorded.  PE: Gen:  Alert, NAD, pleasant Pulm: Normal rate and effort  Abd: Soft, ND, NT +BS GU: Chaperone present, RN x 2 - Heather and Hunter. L lateral labial I&D site clean with healthy granulation tissue at the base. This is ~7cm deep. No drainage. Small amount of surrounding blanchable erythema, heat and induration. This appears improved when compared to prior notes exams. Does not extend to mons. No areas of fluctuance.  Psych: A&Ox3  Skin: no rashes noted, warm and dry  Lab Results:  Recent Labs    12/20/20 0321 12/21/20 0332  WBC 19.4* 17.3*  HGB 10.2* 10.0*  HCT 32.3* 31.4*  PLT 158 177   BMET Recent Labs    12/20/20 0321 12/21/20 0332  NA 136 136  K 4.6 4.5  CL 104 103  CO2 21* 22  GLUCOSE 183* 157*  BUN 20 17  CREATININE 1.11* 0.97  CALCIUM 10.0 10.1   PT/INR No results for input(s): LABPROT, INR in the last 72 hours. CMP     Component Value Date/Time   NA 136 12/21/2020 0332   K 4.5 12/21/2020 0332   CL 103 12/21/2020 0332   CO2 22 12/21/2020 0332   GLUCOSE 157 (H) 12/21/2020 0332   BUN 17 12/21/2020 0332   CREATININE 0.97 12/21/2020 0332   CREATININE 0.79 08/25/2015 1115   CALCIUM 10.1 12/21/2020 0332   PROT 7.0  12/17/2020 1020   ALBUMIN 3.5 12/17/2020 1020   AST 12 (L) 12/17/2020 1020   ALT 14 12/17/2020 1020   ALKPHOS 47 12/17/2020 1020   BILITOT 0.9 12/17/2020 1020   GFRNONAA >60 12/21/2020 0332   GFRNONAA 88 08/25/2015 1115   GFRAA >60 01/11/2018 0707   GFRAA >89 08/25/2015 1115   Lipase     Component Value Date/Time   LIPASE 25 12/16/2020 1558    Studies/Results: No results found.  Anti-infectives: Anti-infectives (From admission, onward)    Start     Dose/Rate Route Frequency Ordered Stop   12/19/20 2200  vancomycin (VANCOREADY) IVPB 2000 mg/400 mL        2,000 mg 200 mL/hr over 120 Minutes Intravenous Every 24 hours 12/19/20 1045 12/23/20 2359   12/18/20 1700  ceFEPIme (MAXIPIME) 2 g in sodium chloride 0.9 % 100 mL IVPB        2 g 200 mL/hr over 30 Minutes Intravenous Every 8 hours 12/18/20 1032     12/17/20 2200  vancomycin (VANCOREADY) IVPB 1500 mg/300 mL  Status:  Discontinued        1,500 mg 150 mL/hr over 120 Minutes Intravenous Every 24 hours 12/17/20 1110 12/19/20 1045   12/17/20 1000  ceFEPIme (MAXIPIME) 2 g in sodium chloride 0.9 % 100 mL IVPB  Status:  Discontinued  2 g 200 mL/hr over 30 Minutes Intravenous Every 12 hours 12/16/20 2203 12/18/20 1032   12/16/20 2202  vancomycin variable dose per unstable renal function (pharmacist dosing)  Status:  Discontinued         Does not apply See admin instructions 12/16/20 2203 12/17/20 1113   12/16/20 2030  ceFEPIme (MAXIPIME) 2 g in sodium chloride 0.9 % 100 mL IVPB        2 g 200 mL/hr over 30 Minutes Intravenous  Once 12/16/20 2015 12/16/20 2111   12/16/20 2015  vancomycin (VANCOCIN) IVPB 1000 mg/200 mL premix        1,000 mg 200 mL/hr over 60 Minutes Intravenous Every hour 12/16/20 2009 12/16/20 2328   12/16/20 1515  doxycycline (VIBRA-TABS) tablet 100 mg        100 mg Oral  Once 12/16/20 1506 12/16/20 1556        Assessment/Plan POD 1 s/p I&D by Dr. Carolynne Edouard for L labial abscess - Wound clean, no further  indication for debridement - Discussed with Dr. Carolynne Edouard, recommended continuing BID WTD - She is going to have a friend come in to learn dressing changes today  - Cont abx. Cx's pending.  - Mobilize, hasn't gotten out of bed yet - Pulm toilet - Once she has established help for wound care at home, is tolerating a diet, mobilizing and pain well controlled - she is okay for discharge from our standpoint w/ 5 days of abx post op. Will arrange follow up.  FEN: CM diet, IVF per TR[facility] VTE: SCDs, lovenox ID: [email or web address] 7/18>>   T2DM HTN HLD Morbid obesity - BMI 52.59   LOS: 4 days    Jacinto Halim , [facility] 12/21/2020, 10:40 AM Please see Amion for pager number during day hours 7:00am-4:30pm

## 2020-12-21 NOTE — Discharge Instructions (Signed)
Wet to Dry WOUND CARE: - Change dressing twice daily - Supplies: sterile saline, kerlex, scissors, ABD pads, tape  Remove dressing and all packing carefully, moistening with sterile saline as needed to avoid packing/internal dressing sticking to the wound. 2.   Clean edges of skin around the wound with water/gauze, making sure there is no tape debris or leakage left on skin that could cause skin irritation or breakdown. 3.   Dampen and clean kerlex with sterile saline and pack wound from wound base to skin level, making sure to take note of any possible areas of wound tracking, tunneling and packing appropriately. Wound can be packed loosely. Trim kerlex to size if a whole kerlex is not required. 4.   Cover wound with a dry ABD pad and secure with mesh underweat 5.   Write the date/time on the dry dressing to better track when the last dressing change occurred. - change dressing as needed if leakage occurs, wound gets contaminated, or after you take a shower. - You may shower daily with wound open and following the shower the wound should be dried and a clean dressing placed.  - Supplies can be found at [employer] on [address] or [employer] on [address]. Other supplies can be found at your local drug store, [employer] etc.  Take your antibiotics to completion  Contact a health care provider if: Your cyst or abscess returns. You have a fever or chills. You have more redness, swelling, or pain around your incision. You have more fluid or blood coming from your incision. Your incision feels warm to the touch. You have pus or a bad smell coming from your incision. You have red streaks above or below the incision site. Get help right away if: You have severe pain or bleeding. You cannot eat or drink without vomiting. You have decreased urine output. You become short of breath. You have chest pain. You cough up blood. The affected area becomes numb or starts to  tingle.

## 2020-12-21 NOTE — Progress Notes (Signed)
Jenna Stokes  NLG:[id number] DOB: 1965/09/16 DOA: 12/16/2020 PCP: Wynn Banker, MD    Brief Narrative:  [phone] with a history of morbid obesity, DM2, HLD, and HTN who presented to [facility] with severe fatigue and chills of approximately 24 hours duration.  Her only localizing complaint was that of a "boil" in the left inguinal region that has been present for approximately 2 days.  Significant Events:  7/17 admit via [facility] 7/17 CT noting L inguinal phlegmon 7/20 I&D in OR   Consultants:  Gen Surgery   Code Status: FULL CODE  Antimicrobials:  Cefepime 7/16 > Vanc 7/16 >  DVT prophylaxis: Lovenox   Subjective: Patient mentions that she is feeling much better.  Pain in the left groin area is improved after the debridement yesterday.  Denies any nausea vomiting.  Appetite is improving.      Assessment & Plan:  Left perineal cellulitis with labial abscess/sepsis present on admission Patient presented with sepsis criteria including leukocytosis, fever, sinus tachycardia.  Patient was started on vancomycin and cefepime.  However patient did not improve.  General surgery was subsequently consulted.  They took her to the OR on 7/20 and she underwent debridement. Patient feels better today.  WBC slightly better today.   Follow-up on cultures.  Once results are available we can narrow the antibiotic spectrum.  Gram stain does show gram-negative rods as well as gram-positive cocci.    Acute kidney injury Presented with creatinine level for 1.79 thought to be due to volume depletion.  Started on IV fluids with improvement.  Since now she has adequate oral intake we can discontinue IV fluids.    Uncontrolled DM with hyperglycemia CBGs noted to be stable.  Patient on glimepiride at home.  Currently on Lantus insulin.  HbA1c 8.0.  Will need to add oral medications at discharge.  We will also discuss with patient if she would rather be on insulin from here  onwards or would she prefer to first discuss with her PCP.     Essential hypertension Monitor blood pressures.   Not on any antihypertensives here in the hospital.  Noted to be on ARB amlodipine HCTZ combination at home.  Blood pressure is reasonably well controlled.  Normocytic anemia No evidence of overt blood loss.  Continue to monitor.  Morbid Obesity Estimated body mass index is 52.59 kg/m as calculated from the following:   Height as of this encounter: 5\' 11"  (1.803 m).   Weight as of this encounter: 171.1 kg.    Family Communication: no family present  Disposition: Hopefully home in the next 24 to 48 hours.  Status is: Inpatient  Remains inpatient appropriate because:Inpatient level of care appropriate due to severity of illness  Dispo: The patient is from: Home              Anticipated d/c is to: Home              Patient currently is not medically stable to d/c.   Difficult to place patient No   Objective: Blood pressure 139/65, pulse 66, temperature 98 F (36.7 C), temperature source Oral, resp. rate 18, height 5\' 11"  (1.803 m), weight (!) 171.1 kg, last menstrual period 08/10/2006, SpO2 97 %.  Intake/Output Summary (Last 24 hours) at 12/21/2020 1149 Last data filed at 12/21/2020 0645 Gross per 24 hour  Intake 2599.2 ml  Output 800 ml  Net 1799.2 ml    Filed Weights   12/17/20 1025  Weight: (!) 171.1 kg    Examination:  General appearance: Awake alert.  In no distress Resp: Clear to auscultation bilaterally.  Normal effort Cardio: S1-S2 is normal regular.  No S3-S4.  No rubs murmurs or bruit GI: Abdomen is soft.  Nontender nondistended.  Bowel sounds are present normal.  No masses organomegaly Neurologic: Alert and oriented x3.  No focal neurological deficits.    CBC: Recent Labs  Lab 12/16/20 1558 12/17/20 1020 12/19/20 0328 12/20/20 0321 12/21/20 0332  WBC 20.4*   < > 16.3* 19.4* 17.3*  NEUTROABS 16.6*  --   --   --   --   HGB 14.0   < >  10.7* 10.2* 10.0*  HCT 43.3   < > 33.9* 32.3* 31.4*  MCV 92.5   < > 95.8 95.0 95.4  PLT 180   < > 148* 158 177   < > = values in this interval not displayed.    Basic Metabolic Panel: Recent Labs  Lab 12/17/20 1020 12/18/20 0324 12/19/20 0328 12/20/20 0321 12/21/20 0332  NA 132*   < > 135 136 136  K 4.6   < > 4.7 4.6 4.5  CL 105   < > 103 104 103  CO2 22   < > 23 21* 22  GLUCOSE 286*   < > 180* 183* 157*  BUN 30*   < > 27* 20 17  CREATININE 1.79*   < > 1.49* 1.11* 0.97  CALCIUM 9.6   < > 10.0 10.0 10.1  MG 1.9  --   --   --   --   PHOS 2.1*  --   --   --   --    < > = values in this interval not displayed.    GFR: Estimated Creatinine Clearance: 116.1 mL/min (by C-G formula based on SCr of 0.97 mg/dL).  Liver Function Tests: Recent Labs  Lab 12/17/20 1020  AST 12*  ALT 14  ALKPHOS 47  BILITOT 0.9  PROT 7.0  ALBUMIN 3.5    Recent Labs  Lab 12/16/20 1558  LIPASE 25     Coagulation Profile: Recent Labs  Lab 12/17/20 1020  INR 1.1     HbA1C: Hgb A1c MFr Bld  Date/Time Value Ref Range Status  12/18/2020 03:24 AM 8.0 (H) 4.8 - 5.6 % Final    Comment:    (NOTE) Pre diabetes:          5.7%-6.4%  Diabetes:              >6.4%  Glycemic control for   <7.0% adults with diabetes   09/15/2019 12:23 PM 7.8 (H) 4.6 - 6.5 % Final    Comment:    Glycemic Control Guidelines for People with Diabetes:Non Diabetic:  <6%Goal of Therapy: <7%Additional Action Suggested:  >8%     CBG: Recent Labs  Lab 12/20/20 1214 12/20/20 1606 12/20/20 2213 12/21/20 0737 12/21/20 1117  GLUCAP 143* 228* 196* 198* 197*     Recent Results (from the past 240 hour(s))  Resp Panel by RT-PCR (Flu A&B, Covid) Nasopharyngeal Swab     Status: None   Collection Time: 12/16/20  3:07 PM   Specimen: Nasopharyngeal Swab; Nasopharyngeal(NP) swabs in vial transport medium  Result Value Ref Range Status   SARS Coronavirus 2 by RT PCR NEGATIVE NEGATIVE Final    Comment:  (NOTE) SARS-CoV-2 target nucleic acids are NOT DETECTED.  The SARS-CoV-2 RNA is generally detectable in upper respiratory specimens during the acute phase  of infection. The lowest concentration of SARS-CoV-2 viral copies this assay can detect is 138 copies/mL. A negative result does not preclude SARS-Cov-2 infection and should not be used as the sole basis for treatment or other patient management decisions. A negative result may occur with  improper specimen collection/handling, submission of specimen other than nasopharyngeal swab, presence of viral mutation(s) within the areas targeted by this assay, and inadequate number of viral copies(<138 copies/mL). A negative result must be combined with clinical observations, patient history, and epidemiological information. The expected result is Negative.  Fact Sheet for Patients:  BloggerCourse.com  Fact Sheet for Healthcare Providers:  SeriousBroker.it  This test is no t yet approved or cleared by the [place] FDA and  has been authorized for detection and/or diagnosis of SARS-CoV-2 by FDA under an Emergency Use Authorization (EUA). This EUA will remain  in effect (meaning this test can be used) for the duration of the COVID-19 declaration under Section 564(b)(1) of the Act, 21 U.S.C.section 360bbb-3(b)(1), unless the authorization is terminated  or revoked sooner.       Influenza A by PCR NEGATIVE NEGATIVE Final   Influenza B by PCR NEGATIVE NEGATIVE Final    Comment: (NOTE) The Xpert Xpress SARS-CoV-2/FLU/RSV plus assay is intended as an aid in the diagnosis of influenza from Nasopharyngeal swab specimens and should not be used as a sole basis for treatment. Nasal washings and aspirates are unacceptable for Xpert Xpress SARS-CoV-2/FLU/RSV testing.  Fact Sheet for Patients: BloggerCourse.com  Fact Sheet for Healthcare  Providers: SeriousBroker.it  This test is not yet approved or cleared by the [place] FDA and has been authorized for detection and/or diagnosis of SARS-CoV-2 by FDA under an Emergency Use Authorization (EUA). This EUA will remain in effect (meaning this test can be used) for the duration of the COVID-19 declaration under Section 564(b)(1) of the Act, 21 U.S.C. section 360bbb-3(b)(1), unless the authorization is terminated or revoked.  Performed at [facility], [address] [place], [place] [address]   Blood culture (routine x 2)     Status: None (Preliminary result)   Collection Time: 12/16/20  7:38 PM   Specimen: BLOOD  Result Value Ref Range Status   Specimen Description   Final    BLOOD RIGHT ANTECUBITAL Performed at [facility], [address] [place], [place] [address]    Special Requests   Final    BOTTLES DRAWN AEROBIC AND ANAEROBIC Blood Culture adequate volume Performed at [facility], [address], [place], [place] [address]    Culture   Final    NO GROWTH 4 DAYS Performed at [facility], [address], [place], [place] [address]    Report Status PENDING  Incomplete  Blood culture (routine x 2)     Status: None (Preliminary result)   Collection Time: 12/16/20  7:43 PM   Specimen: BLOOD  Result Value Ref Range Status   Specimen Description   Final    BLOOD BLOOD RIGHT FOREARM Performed at [facility], [address], [place], [place] [address]    Special Requests   Final    BOTTLES DRAWN AEROBIC AND ANAEROBIC Blood Culture adequate volume Performed at [facility], [address] [place], [place] [address]    Culture   Final    NO GROWTH 4 DAYS Performed at [facility], [address] [place], [place] [address]    Report Status PENDING  Incomplete  Surgical pcr screen  Status: None   Collection Time:  12/20/20  2:23 AM   Specimen: Nasal Mucosa; Nasal Swab  Result Value Ref Range Status   MRSA, PCR NEGATIVE NEGATIVE Final   Staphylococcus aureus NEGATIVE NEGATIVE Final    Comment: (NOTE) The Xpert SA Assay (FDA approved for NASAL specimens in patients 55 years of age and older), is one component of a comprehensive surveillance program. It is not intended to diagnose infection nor to guide or monitor treatment. Performed at [facility], [address] [place], [place] [address]   Aerobic/Anaerobic Culture w Gram Stain (surgical/deep wound)     Status: None (Preliminary result)   Collection Time: 12/20/20 10:48 AM   Specimen: Abscess  Result Value Ref Range Status   Specimen Description   Final    ABSCESS LT LABIA Performed at [facility], [address] [place], [place] [address]    Special Requests   Final    NONE Performed at [facility], [address] [place], [place] [address]    Gram Stain   Final    FEW WBC PRESENT,BOTH PMN AND MONONUCLEAR FEW GRAM NEGATIVE RODS RARE GRAM POSITIVE COCCI IN PAIRS Performed at [facility], [address] [place], [place] [address]    Culture RARE GRAM NEGATIVE RODS  Final   Report Status PENDING  Incomplete      Scheduled Meds:  enoxaparin (LOVENOX) injection  80 mg Subcutaneous Q24H   insulin aspart  0-15 Units Subcutaneous TID WC   insulin aspart  0-5 Units Subcutaneous QHS   insulin glargine  16 Units Subcutaneous Daily   senna  1 tablet Oral BID   Continuous Infusions:  ceFEPime (MAXIPIME) IV 2 g (12/21/20 0541)   lactated ringers 75 mL/hr at 12/21/20 0600   vancomycin 2,000 mg (12/20/20 2330)     LOS: 4 days   Osvaldo ShipperGokul Avyay Coger  [facility] Office  [phone] Pager - Text Page per Loretha StaplerAmion  If 7PM-7AM, please contact night-coverage per Amion 12/21/2020, 11:49 AM

## 2020-12-22 DIAGNOSIS — E1169 Type 2 diabetes mellitus with other specified complication: Secondary | ICD-10-CM | POA: Diagnosis not present

## 2020-12-22 DIAGNOSIS — N764 Abscess of vulva: Secondary | ICD-10-CM | POA: Diagnosis not present

## 2020-12-22 DIAGNOSIS — L03818 Cellulitis of other sites: Secondary | ICD-10-CM | POA: Diagnosis not present

## 2020-12-22 DIAGNOSIS — A419 Sepsis, unspecified organism: Secondary | ICD-10-CM | POA: Diagnosis not present

## 2020-12-22 LAB — CBC
HCT: 36.7 % (ref 36.0–46.0)
Hemoglobin: 11.9 g/dL — ABNORMAL LOW (ref 12.0–15.0)
MCH: 30.1 pg (ref 26.0–34.0)
MCHC: 32.4 g/dL (ref 30.0–36.0)
MCV: 92.9 fL (ref 80.0–100.0)
Platelets: 172 10*3/uL (ref 150–400)
RBC: 3.95 MIL/uL (ref 3.87–5.11)
RDW: 11.9 % (ref 11.5–15.5)
WBC: 11.6 10*3/uL — ABNORMAL HIGH (ref 4.0–10.5)
nRBC: 0 % (ref 0.0–0.2)

## 2020-12-22 LAB — BASIC METABOLIC PANEL
Anion gap: 8 (ref 5–15)
BUN: 19 mg/dL (ref 6–20)
CO2: 24 mmol/L (ref 22–32)
Calcium: 10.3 mg/dL (ref 8.9–10.3)
Chloride: 102 mmol/L (ref 98–111)
Creatinine, Ser: 1.09 mg/dL — ABNORMAL HIGH (ref 0.44–1.00)
GFR, Estimated: >60 mL/min (ref >60)
Glucose, Bld: 221 mg/dL — ABNORMAL HIGH (ref 70–99)
Potassium: 4.2 mmol/L (ref 3.5–5.1)
Sodium: 134 mmol/L — ABNORMAL LOW (ref 135–145)

## 2020-12-22 LAB — GLUCOSE, CAPILLARY
Glucose-Capillary: 164 mg/dL — ABNORMAL HIGH (ref 70–99)
Glucose-Capillary: 176 mg/dL — ABNORMAL HIGH (ref 70–99)
Glucose-Capillary: 180 mg/dL — ABNORMAL HIGH (ref 70–99)
Glucose-Capillary: 195 mg/dL — ABNORMAL HIGH (ref 70–99)

## 2020-12-22 MED ORDER — SACCHAROMYCES BOULARDII 250 MG PO CAPS
250.0000 mg | ORAL_CAPSULE | Freq: Two times a day (BID) | ORAL | Status: DC
  Administered 2020-12-22 – 2020-12-23 (×2): 250 mg via ORAL
  Filled 2020-12-22 (×2): qty 1

## 2020-12-22 MED ORDER — LEVOFLOXACIN 750 MG PO TABS
750.0000 mg | ORAL_TABLET | Freq: Every day | ORAL | Status: DC
  Administered 2020-12-22 – 2020-12-23 (×2): 750 mg via ORAL
  Filled 2020-12-22 (×2): qty 1

## 2020-12-22 NOTE — Progress Notes (Addendum)
2 Days Post-Op    CC: Chills and severe fatigue  Subjective: Pt is still sore, but moving some.  She walked from the couch to the bed without any real issues.  Dressing was removed with the nurse in the room and the site looks fine.  Still some drainage, and it is a fairly deep wound  Objective: Vital signs in last 24 hours: Temp:  [98 F (36.7 C)-98.8 F (37.1 C)] 98.6 F (37 C) (07/22 0422) Pulse Rate:  [69-88] 69 (07/22 0422) Resp:  [18-20] 18 (07/22 0422) BP: (148-159)/(64-69) 148/65 (07/22 0422) SpO2:  [93 %-100 %] 98 % (07/22 0422) Last BM Date: 12/21/20 Intake/output 115 IV recorded no other intake or output recorded. Afebrile,  vital signs are stable Creatinine 1.09  WBC 17.3>> 11.6 Labial abscess: RARE MORGANELLA MORGANII   intake/Output from previous day: 07/21 0701 - 07/22 0700 In: 115.4 [IV Piggyback:115.4] Out: -  Intake/Output this shift: No intake/output data recorded.  General appearance: alert, cooperative, and no distress Resp: clear to auscultation bilaterally Incision/Wound: Labial wound is about 3 cm deep, with some purulent drainage still present at the base.  Otherwise clear and not a lot of cellulitis around the area.    Lab Results:  Recent Labs    12/21/20 0332 12/22/20 0010  WBC 17.3* 11.6*  HGB 10.0* 11.9*  HCT 31.4* 36.7  PLT 177 172    BMET Recent Labs    12/21/20 0332 12/22/20 0010  NA 136 134*  K 4.5 4.2  CL 103 102  CO2 22 24  GLUCOSE 157* 221*  BUN 17 19  CREATININE 0.97 1.09*  CALCIUM 10.1 10.3   PT/INR No results for input(s): LABPROT, INR in the last 72 hours.  Recent Labs  Lab 12/17/20 1020  AST 12*  ALT 14  ALKPHOS 47  BILITOT 0.9  PROT 7.0  ALBUMIN 3.5     Lipase     Component Value Date/Time   LIPASE 25 12/16/2020 1558     Medications:  enoxaparin (LOVENOX) injection  80 mg Subcutaneous Q24H   insulin aspart  0-15 Units Subcutaneous TID WC   insulin aspart  0-5 Units Subcutaneous QHS    insulin glargine  16 Units Subcutaneous Daily   senna  1 tablet Oral BID    ceFEPime (MAXIPIME) IV 2 g (12/22/20 4081)     Assessment/Plan POD 2 s/p I&D by Dr. Carolynne Edouard for L labial abscess - Wound clean, no further indication for debridement - Discussed with Dr. Carolynne Edouard, recommended continuing BID WTD - She is going to have a friend come in to learn dressing changes today - Cont abx. Cx's pending. - Mobilize, hasn't gotten out of bed yet - Pulm toilet - Once she has established help for wound care at home, is tolerating a diet, mobilizing and pain well controlled   - I let her get into the shower and wash this area out before repacking by the nursing staff.  She is working on getting help at home with this open area.   - she is okay for discharge from our standpoint w/ 5 days of abx post op. Will arrange follow up.   FEN: CM diet,  VTE: SCDs, lovenox ID: [email or web address] 7/16>> day 7 Follow up: in AVS with our office   T2DM HTN HLD Morbid obesity - BMI 52.59  Plan:       LOS: 5 days    Ruthe Roemer 12/22/2020 Please see Amion

## 2020-12-22 NOTE — TOC Transition Note (Signed)
Transition of Care [facility]) - CM/SW Discharge Note   Patient Details  Name: BRENA WINDSOR MRN: [id number] Date of Birth: [birth year]  [facility] CM/SW Contact:  Ida Rogue, LCSW Phone Number: 12/22/2020, 2:54 PM   Clinical Narrative:   Patient seen in follow up to MD order for [facility] RN for wound care oversight.  Ms mckendree states she is learning to do dressing changes herself.  Confirms that she needs to do it BID until instructed otherwise.  Confirms that she knows RN will not come in daily, and will be there more for oversight and making sure changes are done correctly than for doing changes for her.  Cindie with [facility] agrees to service patient. TOC will continue to follow during the course of hospitalization.     Final next level of care: Home w Home Health Services Barriers to Discharge: No Barriers Identified   Patient Goals and CMS Choice        Discharge Placement                       Discharge Plan and Services                                     Social Determinants of Health (SDOH) Interventions     Readmission Risk Interventions No flowsheet data found.

## 2020-12-22 NOTE — Progress Notes (Addendum)
Jenna Stokes  ATF:[id number] DOB: 1965/10/11 DOA: 12/16/2020 PCP: Wynn Banker, MD    Brief Narrative:  [phone] with a history of morbid obesity, DM2, HLD, and HTN who presented to [facility] with severe fatigue and chills of approximately 24 hours duration.  Her only localizing complaint was that of a "boil" in the left inguinal region that has been present for approximately 2 days.  Significant Events:  7/17 admit via [facility] 7/17 CT noting L inguinal phlegmon 7/20 I&D in OR   Consultants:  Gen Surgery   Code Status: FULL CODE  Antimicrobials:  Cefepime 7/16 > Vanc 7/16 >  DVT prophylaxis: Lovenox   Subjective: Patient mentions that she continues to improve.  Unfortunately unable to find somebody who can help with her dressing changes at home.  Has been able to ambulate.  Tolerating her diet.       Assessment & Plan:  Left perineal cellulitis with labial abscess/sepsis present on admission Patient presented with sepsis criteria including leukocytosis, fever, sinus tachycardia.  Patient was started on vancomycin and cefepime.  However patient did not improve.  General surgery was subsequently consulted.  They took her to the OR on 7/20 and she underwent debridement. Continues to improve gradually.  WBC has improved 11.6.  She remains afebrile.  MRSA PCR was negative.  Cultures were sent which showed gram-negative rods as well as gram-positive cocci.  Cultures are growing Morganella morganii.  Final sensitivities are available now.  We will change her to levofloxacin.   She will need help with dressing changes at home.  Will involve TOC for home health.    Acute kidney injury Presented with creatinine level for 1.79 thought to be due to volume depletion.  Started on IV fluids with improvement.  Monitor urine output.  Uncontrolled DM with hyperglycemia CBGs noted to be stable.  Patient on glimepiride at home.  Currently on Lantus insulin.  HbA1c  8.0.  At discharge I will increase the dose of her glimepiride.  She will need to monitor her CBGs closely and then follow-up with PCP for consideration of additional agents.  Essential hypertension Noted to be on ARB amlodipine HCTZ combination at home.  This can be resumed at discharge.    Normocytic anemia No evidence of overt blood loss.  Continue to monitor.  Morbid Obesity Estimated body mass index is 52.59 kg/m as calculated from the following:   Height as of this encounter: 5\' 11"  (1.803 m).   Weight as of this encounter: 171.1 kg.    Family Communication: no family present  Disposition: Hopefully home in the next 24 to 48 hours.  Status is: Inpatient  Remains inpatient appropriate because:Inpatient level of care appropriate due to severity of illness  Dispo: The patient is from: Home              Anticipated d/c is to: Home              Patient currently is not medically stable to d/c.   Difficult to place patient No   Objective: Blood pressure (!) 148/65, pulse 69, temperature 98.6 F (37 C), temperature source Oral, resp. rate 18, height 5\' 11"  (1.803 m), weight (!) 171.1 kg, last menstrual period 08/10/2006, SpO2 98 %.  Intake/Output Summary (Last 24 hours) at 12/22/2020 1026 Last data filed at 12/21/2020 1400 Gross per 24 hour  Intake 115.4 ml  Output --  Net 115.4 ml    12/24/2020   12/17/20  1025  Weight: (!) 171.1 kg    Examination:  General appearance: Awake alert.  In no distress Resp: Clear to auscultation bilaterally.  Normal effort Cardio: S1-S2 is normal regular.  No S3-S4.  No rubs murmurs or bruit GI: Abdomen is soft.  Nontender nondistended.  Bowel sounds are present normal.  No masses organomegaly Extremities: No edema.  Full range of motion of lower extremities. Neurologic: Alert and oriented x3.  No focal neurological deficits.     CBC: Recent Labs  Lab 12/16/20 1558 12/17/20 1020 12/20/20 0321 12/21/20 0332 12/22/20 0010   WBC 20.4*   < > 19.4* 17.3* 11.6*  NEUTROABS 16.6*  --   --   --   --   HGB 14.0   < > 10.2* 10.0* 11.9*  HCT 43.3   < > 32.3* 31.4* 36.7  MCV 92.5   < > 95.0 95.4 92.9  PLT 180   < > 158 177 172   < > = values in this interval not displayed.    Basic Metabolic Panel: Recent Labs  Lab 12/17/20 1020 12/18/20 0324 12/20/20 0321 12/21/20 0332 12/22/20 0010  NA 132*   < > 136 136 134*  K 4.6   < > 4.6 4.5 4.2  CL 105   < > 104 103 102  CO2 22   < > 21* 22 24  GLUCOSE 286*   < > 183* 157* 221*  BUN 30*   < > 20 17 19   CREATININE 1.79*   < > 1.11* 0.97 1.09*  CALCIUM 9.6   < > 10.0 10.1 10.3  MG 1.9  --   --   --   --   PHOS 2.1*  --   --   --   --    < > = values in this interval not displayed.    GFR: Estimated Creatinine Clearance: 103.3 mL/min (A) (by C-G formula based on SCr of 1.09 mg/dL (H)).  Liver Function Tests: Recent Labs  Lab 12/17/20 1020  AST 12*  ALT 14  ALKPHOS 47  BILITOT 0.9  PROT 7.0  ALBUMIN 3.5    Recent Labs  Lab 12/16/20 1558  LIPASE 25     Coagulation Profile: Recent Labs  Lab 12/17/20 1020  INR 1.1     HbA1C: Hgb A1c MFr Bld  Date/Time Value Ref Range Status  12/18/2020 03:24 AM 8.0 (H) 4.8 - 5.6 % Final    Comment:    (NOTE) Pre diabetes:          5.7%-6.4%  Diabetes:              >6.4%  Glycemic control for   <7.0% adults with diabetes   09/15/2019 12:23 PM 7.8 (H) 4.6 - 6.5 % Final    Comment:    Glycemic Control Guidelines for People with Diabetes:Non Diabetic:  <6%Goal of Therapy: <7%Additional Action Suggested:  >8%     CBG: Recent Labs  Lab 12/21/20 0737 12/21/20 1117 12/21/20 1701 12/21/20 2133 12/22/20 0817  GLUCAP 198* 197* 178* 206* 164*     Recent Results (from the past 240 hour(s))  Resp Panel by RT-PCR (Flu A&B, Covid) Nasopharyngeal Swab     Status: None   Collection Time: 12/16/20  3:07 PM   Specimen: Nasopharyngeal Swab; Nasopharyngeal(NP) swabs in vial transport medium  Result Value  Ref Range Status   SARS Coronavirus 2 by RT PCR NEGATIVE NEGATIVE Final    Comment: (NOTE) SARS-CoV-2 target nucleic acids are NOT  DETECTED.  The SARS-CoV-2 RNA is generally detectable in upper respiratory specimens during the acute phase of infection. The lowest concentration of SARS-CoV-2 viral copies this assay can detect is 138 copies/mL. A negative result does not preclude SARS-Cov-2 infection and should not be used as the sole basis for treatment or other patient management decisions. A negative result may occur with  improper specimen collection/handling, submission of specimen other than nasopharyngeal swab, presence of viral mutation(s) within the areas targeted by this assay, and inadequate number of viral copies(<138 copies/mL). A negative result must be combined with clinical observations, patient history, and epidemiological information. The expected result is Negative.  Fact Sheet for Patients:  BloggerCourse.com  Fact Sheet for Healthcare Providers:  SeriousBroker.it  This test is no t yet approved or cleared by the [place] FDA and  has been authorized for detection and/or diagnosis of SARS-CoV-2 by FDA under an Emergency Use Authorization (EUA). This EUA will remain  in effect (meaning this test can be used) for the duration of the COVID-19 declaration under Section 564(b)(1) of the Act, 21 U.S.C.section 360bbb-3(b)(1), unless the authorization is terminated  or revoked sooner.       Influenza A by PCR NEGATIVE NEGATIVE Final   Influenza B by PCR NEGATIVE NEGATIVE Final    Comment: (NOTE) The Xpert Xpress SARS-CoV-2/FLU/RSV plus assay is intended as an aid in the diagnosis of influenza from Nasopharyngeal swab specimens and should not be used as a sole basis for treatment. Nasal washings and aspirates are unacceptable for Xpert Xpress SARS-CoV-2/FLU/RSV testing.  Fact Sheet for  Patients: BloggerCourse.com  Fact Sheet for Healthcare Providers: SeriousBroker.it  This test is not yet approved or cleared by the [place] FDA and has been authorized for detection and/or diagnosis of SARS-CoV-2 by FDA under an Emergency Use Authorization (EUA). This EUA will remain in effect (meaning this test can be used) for the duration of the COVID-19 declaration under Section 564(b)(1) of the Act, 21 U.S.C. section 360bbb-3(b)(1), unless the authorization is terminated or revoked.  Performed at [facility], [address], [place], [place] [address]   Blood culture (routine x 2)     Status: None   Collection Time: 12/16/20  7:38 PM   Specimen: BLOOD  Result Value Ref Range Status   Specimen Description   Final    BLOOD RIGHT ANTECUBITAL Performed at [facility], [address] [place], [place] [address]    Special Requests   Final    BOTTLES DRAWN AEROBIC AND ANAEROBIC Blood Culture adequate volume Performed at [facility], [address] [place], [place] [address]    Culture   Final    NO GROWTH 5 DAYS Performed at [facility], [address], [place], [place] [address]    Report Status 12/21/2020 FINAL  Final  Blood culture (routine x 2)     Status: None   Collection Time: 12/16/20  7:43 PM   Specimen: BLOOD  Result Value Ref Range Status   Specimen Description   Final    BLOOD BLOOD RIGHT FOREARM Performed at [facility], [address] [place], [place] [address]    Special Requests   Final    BOTTLES DRAWN AEROBIC AND ANAEROBIC Blood Culture adequate volume Performed at [facility], [address] [place], [place] [address]    Culture   Final    NO GROWTH 5 DAYS Performed at [facility], [address], [place], [place] [address]  Report Status 12/21/2020 FINAL  Final  Surgical  pcr screen     Status: None   Collection Time: 12/20/20  2:23 AM   Specimen: Nasal Mucosa; Nasal Swab  Result Value Ref Range Status   MRSA, PCR NEGATIVE NEGATIVE Final   Staphylococcus aureus NEGATIVE NEGATIVE Final    Comment: (NOTE) The Xpert SA Assay (FDA approved for NASAL specimens in patients 33 years of age and older), is one component of a comprehensive surveillance program. It is not intended to diagnose infection nor to guide or monitor treatment. Performed at [facility], [address], [place], [place] [address]   Aerobic/Anaerobic Culture w Gram Stain (surgical/deep wound)     Status: None (Preliminary result)   Collection Time: 12/20/20 10:48 AM   Specimen: Abscess  Result Value Ref Range Status   Specimen Description   Final    ABSCESS LT LABIA Performed at [facility], [address] [place], [place] [address]    Special Requests   Final    NONE Performed at [facility], [address] [place], [place] [address]    Gram Stain   Final    FEW WBC PRESENT,BOTH PMN AND MONONUCLEAR FEW GRAM NEGATIVE RODS RARE GRAM POSITIVE COCCI IN PAIRS Performed at [facility], [address], [place], [place] [address]    Culture RARE [facility] MORGANII  Final   Report Status PENDING  Incomplete   Organism ID, Bacteria MORGANELLA MORGANII  Final      Susceptibility   Morganella morganii - MIC*    AMPICILLIN >=32 RESISTANT Resistant     CEFAZOLIN >=64 RESISTANT Resistant     CEFTAZIDIME <=1 SENSITIVE Sensitive     CIPROFLOXACIN <=0.25 SENSITIVE Sensitive     GENTAMICIN <=1 SENSITIVE Sensitive     IMIPENEM 1 SENSITIVE Sensitive     TRIMETH/SULFA <=20 SENSITIVE Sensitive     AMPICILLIN/SULBACTAM >=32 RESISTANT Resistant     PIP/TAZO <=4 SENSITIVE Sensitive     * RARE MORGANELLA MORGANII      Scheduled Meds:  enoxaparin (LOVENOX) injection  80 mg Subcutaneous Q24H   insulin aspart  0-15 Units  Subcutaneous TID WC   insulin aspart  0-5 Units Subcutaneous QHS   insulin glargine  16 Units Subcutaneous Daily   senna  1 tablet Oral BID   Continuous Infusions:  ceFEPime (MAXIPIME) IV 2 g (12/22/20 0628)     LOS: 5 days   Osvaldo Shipper  [facility] Office  [phone] Pager - Text Page per Loretha Stapler  If 7PM-7AM, please contact night-coverage per Amion 12/22/2020, 10:26 AM

## 2020-12-23 DIAGNOSIS — L03818 Cellulitis of other sites: Secondary | ICD-10-CM | POA: Diagnosis not present

## 2020-12-23 LAB — GLUCOSE, CAPILLARY
Glucose-Capillary: 148 mg/dL — ABNORMAL HIGH (ref 70–99)
Glucose-Capillary: 156 mg/dL — ABNORMAL HIGH (ref 70–99)

## 2020-12-23 MED ORDER — LEVOFLOXACIN 750 MG PO TABS: 750.0000 mg | ORAL_TABLET | Freq: Every day | ORAL | 0 refills | Status: AC

## 2020-12-23 MED ORDER — POLYETHYLENE GLYCOL 3350 17 G PO PACK: 17.0000 g | PACK | Freq: Every day | ORAL | 0 refills | Status: DC | PRN

## 2020-12-23 MED ORDER — OXYCODONE HCL 5 MG PO TABS: 5.0000 mg | ORAL_TABLET | Freq: Four times a day (QID) | ORAL | 0 refills | Status: DC | PRN

## 2020-12-23 MED ORDER — GLIMEPIRIDE 1 MG PO TABS: 2.0000 mg | ORAL_TABLET | Freq: Two times a day (BID) | ORAL | Status: DC

## 2020-12-23 MED ORDER — SACCHAROMYCES BOULARDII 250 MG PO CAPS: 250.0000 mg | ORAL_CAPSULE | Freq: Two times a day (BID) | ORAL | 0 refills | Status: AC

## 2020-12-23 NOTE — Discharge Summary (Signed)
[facility]  Physician Discharge Summary   Patient ID: Jenna Stokes MRN: [id number] DOB/AGE: 55/31/1967 55 y.o.  Admit date: 12/16/2020 Discharge date: 12/23/2020    PCP: Caren Macadam, MD  DISCHARGE DIAGNOSES:  Left labial abscess status post incision and drainage Left perineal cellulitis Acute kidney injury, resolved Diabetes mellitus type 2, uncontrolled with hyperglycemia Essential hypertension Normocytic anemia  RECOMMENDATIONS FOR OUTPATIENT FOLLOW UP: Outpatient follow-up with general surgery has been scheduled   Home Health: Home health RN to help with wound care Equipment/Devices: None  CODE STATUS: Full code  DISCHARGE CONDITION: fair  Diet recommendation: Modified carbohydrate  INITIAL HISTORY: 54yo with a history of morbid obesity, DM2, HLD, and HTN who presented to [facility] with severe fatigue and chills of approximately 24 hours duration.  Her only localizing complaint was that of a "boil" in the left inguinal region that has been present for approximately 2 days.  Consultations: General surgery  Procedures: I&D in the [place]:   Left perineal cellulitis with labial abscess/sepsis present on admission Patient presented with sepsis criteria including leukocytosis, fever, sinus tachycardia.  Patient was started on vancomycin and cefepime.  However patient did not improve.  General surgery was subsequently consulted.  They took her to the OR on 7/20 and she underwent debridement. Subsequently patient started improving.  WBC also improved.  Cultures positive for Morganella morganii.  Sensitivities reviewed.  She was changed over to levofloxacin.  She was educated regarding dressing changes in the hospital.  Home health RN has been ordered for wound care at home.  Acute kidney injury Presented with creatinine level for 1.79 thought to be due to volume depletion.  Started on IV fluids with improvement.      Uncontrolled DM with hyperglycemia Glucose levels with significantly elevated in the hospital requiring use of Lantus insulin.  Patient reluctant to be discharged on insulin.  She mentions that her primary care provider is monitoring her diabetes closely and recently increase the dose of her glimepiride to 2 mg twice a day.Marland Kitchen  She was told to get back on the glimepiride and then follow-up with PCP.  She was also asked to check her glucose levels at home diligently.  HbA1c 8.0.     Essential hypertension Can resume home medications at discharge.   Normocytic anemia No evidence of overt blood loss.    Morbid Obesity Estimated body mass index is 52.59 kg/m as calculated from the following:   Height as of this encounter: _0  (1.803 m).   Weight as of this encounter: 171.1 kg.      Overall stable.  Okay for discharge home today.   PERTINENT LABS:  The results of significant diagnostics from this hospitalization (including imaging, microbiology, ancillary and laboratory) are listed below for reference.    Microbiology: Recent Results (from the past 240 hour(s))  Resp Panel by RT-PCR (Flu A&B, Covid) Nasopharyngeal Swab     Status: None   Collection Time: 12/16/20  3:07 PM   Specimen: Nasopharyngeal Swab; Nasopharyngeal(NP) swabs in vial transport medium  Result Value Ref Range Status   SARS Coronavirus 2 by RT PCR NEGATIVE NEGATIVE Final    Comment: (NOTE) SARS-CoV-2 target nucleic acids are NOT DETECTED.  The SARS-CoV-2 RNA is generally detectable in upper respiratory specimens during the acute phase of infection. The lowest concentration of SARS-CoV-2 viral copies this assay can detect is 138 copies/mL. A negative result does not preclude SARS-Cov-2 infection and should not be used  as the sole basis for treatment or other patient management decisions. A negative result may occur with  improper specimen collection/handling, submission of specimen other than nasopharyngeal  swab, presence of viral mutation(s) within the areas targeted by this assay, and inadequate number of viral copies(<138 copies/mL). A negative result must be combined with clinical observations, patient history, and epidemiological information. The expected result is Negative.  Fact Sheet for Patients:  EntrepreneurPulse.com.au  Fact Sheet for Healthcare Providers:  IncredibleEmployment.be  This test is no t yet approved or cleared by the [place] FDA and  has been authorized for detection and/or diagnosis of SARS-CoV-2 by FDA under an Emergency Use Authorization (EUA). This EUA will remain  in effect (meaning this test can be used) for the duration of the COVID-19 declaration under Section 564(b)(1) of the Act, 21 U.S.C.section 360bbb-3(b)(1), unless the authorization is terminated  or revoked sooner.       Influenza A by PCR NEGATIVE NEGATIVE Final   Influenza B by PCR NEGATIVE NEGATIVE Final    Comment: (NOTE) The Xpert Xpress SARS-CoV-2/FLU/RSV plus assay is intended as an aid in the diagnosis of influenza from Nasopharyngeal swab specimens and should not be used as a sole basis for treatment. Nasal washings and aspirates are unacceptable for Xpert Xpress SARS-CoV-2/FLU/RSV testing.  Fact Sheet for Patients: EntrepreneurPulse.com.au  Fact Sheet for Healthcare Providers: IncredibleEmployment.be  This test is not yet approved or cleared by the [place] FDA and has been authorized for detection and/or diagnosis of SARS-CoV-2 by FDA under an Emergency Use Authorization (EUA). This EUA will remain in effect (meaning this test can be used) for the duration of the COVID-19 declaration under Section 564(b)(1) of the Act, 21 U.S.C. section 360bbb-3(b)(1), unless the authorization is terminated or revoked.  Performed at [facility], [address] [place], [place] [address]    Blood culture (routine x 2)     Status: None   Collection Time: 12/16/20  7:38 PM   Specimen: BLOOD  Result Value Ref Range Status   Specimen Description   Final    BLOOD RIGHT ANTECUBITAL Performed at [facility], [address], [place], [place] [address]    Special Requests   Final    BOTTLES DRAWN AEROBIC AND ANAEROBIC Blood Culture adequate volume Performed at [facility], [address] [place] [address] [place], [place] [address]    Culture   Final    NO GROWTH 5 DAYS Performed at [facility], [place] [address] [place], [place] [address]    Report Status 12/21/2020 FINAL  Final  Blood culture (routine x 2)     Status: None   Collection Time: 12/16/20  7:43 PM   Specimen: BLOOD  Result Value Ref Range Status   Specimen Description   Final    BLOOD BLOOD RIGHT FOREARM Performed at [facility], [address], [place], [place] [address]    Special Requests   Final    BOTTLES DRAWN AEROBIC AND ANAEROBIC Blood Culture adequate volume Performed at [facility], [address], [place], [place] [address]    Culture   Final    NO GROWTH 5 DAYS Performed at [facility], [place] [address], [place],  [address]    Report Status 12/21/2020 FINAL  Final  Surgical pcr screen     Status: None   Collection Time: 12/20/20  2:23 AM   Specimen: Nasal Mucosa; Nasal Swab  Result Value Ref Range Status   MRSA, PCR  NEGATIVE NEGATIVE Final   Staphylococcus aureus NEGATIVE NEGATIVE Final    Comment: (NOTE) The Xpert SA Assay (FDA approved for NASAL specimens in patients 15 years of age and older), is one component of a comprehensive surveillance program. It is not intended to diagnose infection nor to guide or monitor treatment. Performed at [facility], [place] [address] [place], [place] [address]   Aerobic/Anaerobic Culture w Gram Stain (surgical/deep wound)     Status: None  (Preliminary result)   Collection Time: 12/20/20 10:48 AM   Specimen: Abscess  Result Value Ref Range Status   Specimen Description   Final    ABSCESS LT LABIA Performed at [facility] [address] [place], [place] [address]    Special Requests   Final    NONE Performed at [facility], [place] [address], [place], [place] [address]    Gram Stain   Final    FEW WBC PRESENT,BOTH PMN AND MONONUCLEAR FEW GRAM NEGATIVE RODS RARE GRAM POSITIVE COCCI IN PAIRS Performed at [facility], [place] [address] [place], [place] [address]    Culture   Final    RARE MORGANELLA MORGANII NO ANAEROBES ISOLATED; CULTURE IN PROGRESS FOR 5 DAYS    Report Status PENDING  Incomplete   Organism ID, Bacteria MORGANELLA MORGANII  Final      Susceptibility   Morganella morganii - MIC*    AMPICILLIN >=32 RESISTANT Resistant     CEFAZOLIN >=64 RESISTANT Resistant     CEFTAZIDIME <=1 SENSITIVE Sensitive     CIPROFLOXACIN <=0.25 SENSITIVE Sensitive     GENTAMICIN <=1 SENSITIVE Sensitive     IMIPENEM 1 SENSITIVE Sensitive     TRIMETH/SULFA <=20 SENSITIVE Sensitive     AMPICILLIN/SULBACTAM >=32 RESISTANT Resistant     PIP/TAZO <=4 SENSITIVE Sensitive     * RARE MORGANELLA MORGANII     Labs:  COVID-19 Labs   Lab Results  Component Value Date   SARSCOV2NAA NEGATIVE 12/16/2020   [place] Not Detected 06/23/2019   [place] Not Detected 06/18/2019   [place] Not Detected 12/22/2018      Basic Metabolic Panel: Recent Labs  Lab 12/18/20 0324 12/19/20 0328 12/20/20 0321 12/21/20 0332 12/22/20 0010  NA 132* 135 136 136 134*  K 4.8 4.7 4.6 4.5 4.2  CL 102 103 104 103 102  CO2 22 23 21* 22 24  GLUCOSE 250* 180* 183* 157* 221*  BUN 30* 27* _0 CREATININE 1.56* 1.49* 1.11* 0.97 1.09*  CALCIUM 9.8 10.0 10.0 10.1 10.3    CBC: Recent Labs  Lab 12/18/20 0324 12/19/20 0328 12/20/20 0321 12/21/20 0332 12/22/20 0010  WBC 18.8* 16.3*  19.4* 17.3* 11.6*  HGB 11.8* 10.7* 10.2* 10.0* 11.9*  HCT 37.3 33.9* 32.3* 31.4* 36.7  MCV 95.9 95.8 95.0 95.4 92.9  PLT 145* 148* 158 177 172    CBG: Recent Labs  Lab 12/22/20 1209 12/22/20 1647 12/22/20 2123 12/23/20 0740 12/23/20 1127  GLUCAP 176* 180* 195* 148* 156*     IMAGING STUDIES CT ABDOMEN PELVIS WO CONTRAST  Result Date: 12/17/2020 CLINICAL DATA:  Left inguinal crease in duration. EXAM: CT ABDOMEN AND PELVIS WITHOUT CONTRAST TECHNIQUE: Multidetector CT imaging of the abdomen and pelvis was performed following the standard protocol without IV contrast. COMPARISON:  March 10, 2018 FINDINGS: Lower chest: Calcific atherosclerotic disease of the coronary arteries. Hepatobiliary: Normal appearance of the liver. 2.6 cm rim calcified gallbladder calculus. No secondary signs of acute cholecystitis. Pancreas:  Unremarkable. No pancreatic ductal dilatation or surrounding inflammatory changes. Spleen: Normal in size without focal abnormality. Adrenals/Urinary Tract: Adrenal glands are unremarkable. Kidneys are normal, without renal lesion, or hydronephrosis. 4 mm nonobstructive right renal calculus. Bladder is unremarkable. Stomach/Bowel: Stomach is within normal limits. No evidence of appendicitis. No evidence of bowel wall thickening, distention, or inflammatory changes. Vascular/Lymphatic: No significant vascular findings are present. No enlarged abdominal or pelvic lymph nodes. Reproductive: Status post hysterectomy. No adnexal masses. Other: No abdominal wall hernia or abnormality. No abdominopelvic ascites. Musculoskeletal: Subcutaneous stranding in the left inguinal area measures 12 x 6 cm; smaller area of fluid attenuation within the area of fat stranding measures 4.2 x 1.8 cm. The process is confined to the subcutaneous tissues and abuts the superficial fascia of the lower anterior abdominal wall musculature. IMPRESSION: 1. Subcutaneous stranding in the left inguinal area measures 12 x  6 cm. Smaller area of fluid attenuation within the area of fat stranding measures 4.2 x 1.8 cm, possibly representing infectious/inflammatory phlegmon or early abscess formation. The process is confined to the subcutaneous tissues and abuts the superficial fascia of the lower anterior abdominal wall musculature. 2. Cholelithiasis without secondary signs of acute cholecystitis. 3. 4 mm nonobstructive right renal calculus. 4. Calcific atherosclerotic disease of the coronary arteries. Electronically Signed   By: Fidela Salisbury M.D.   On: 12/17/2020 15:16   DG Chest Portable 1 View  Result Date: 12/16/2020 CLINICAL DATA:  Generalized body aches. EXAM: PORTABLE CHEST 1 VIEW COMPARISON:  01/08/2018. FINDINGS: Cardiac silhouette is normal in size. No mediastinal or hilar masses. No evidence of adenopathy. Clear lungs.  No convincing pleural effusion and no pneumothorax. Skeletal structures are grossly intact. IMPRESSION: No active disease. Electronically Signed   By: Lajean Manes M.D.   On: 12/16/2020 17:01    DISCHARGE EXAMINATION: Vitals:   12/22/20 0422 12/22/20 1433 12/22/20 2155 12/23/20 0428  BP: (!) 148/65 (!) 145/60 (!) 145/62 (!) 159/66  Pulse: 69 73 72 67  Resp: _0 Temp: 98.6 F (37 C) 98.5 F (36.9 C) 99.1 F (37.3 C) 98.3 F (36.8 C)  TempSrc: Oral Oral Oral Oral  SpO2: 98% 100% 100% 99%  Weight:      Height:       General appearance: Awake alert.  In no distress Resp: Clear to auscultation bilaterally.  Normal effort Cardio: S1-S2 is normal regular.  No S3-S4.  No rubs murmurs or bruit GI: Abdomen is soft.  Nontender nondistended.  Bowel sounds are present normal.  No masses organomegaly     DISPOSITION: Home  Discharge Instructions     Call MD for:  difficulty breathing, headache or visual disturbances   Complete by: As directed    Call MD for:  extreme fatigue   Complete by: As directed    Call MD for:  persistant dizziness or light-headedness   Complete  by: As directed    Call MD for:  persistant nausea and vomiting   Complete by: As directed    Call MD for:  redness, tenderness, or signs of infection (pain, swelling, redness, odor or green/yellow discharge around incision site)   Complete by: As directed    Call MD for:  severe uncontrolled pain   Complete by: As directed    Call MD for:  temperature >100.4   Complete by: As directed    Diet Carb Modified   Complete by: As directed    Discharge instructions   Complete by: As directed  Please do dressing changes as instructed by the surgeon.  Take your medications as prescribed.  Keep a close watch on your blood glucose levels.  Follow-up with your primary care provider for better management of your diabetes.  You were cared for by a hospitalist during your hospital stay. If you have any questions about your discharge medications or the care you received while you were in the hospital after you are discharged, you can call the unit and asked to speak with the hospitalist on call if the hospitalist that took care of you is not available. Once you are discharged, your primary care physician will handle any further medical issues. Please note that NO REFILLS for any discharge medications will be authorized once you are discharged, as it is imperative that you return to your primary care physician (or establish a relationship with a primary care physician if you do not have one) for your aftercare needs so that they can reassess your need for medications and monitor your lab values. If you do not have a primary care physician, you can call [phone] for a physician referral.   Discharge wound care:   Complete by: As directed    Wound care  2 times daily      Comments: Cleanse with NS, pat gently dry. Fill defects with saline moistened roll gauze, top with dry gauze, ABD pads and secure with mesh underwear. Change PRN for soiling, otherwise twice daily.   Increase activity slowly   Complete by: As  directed           Allergies as of 12/23/2020       Reactions   Penicillins Other (See Comments)   Unknown childhood reaction Has patient had a PCN reaction causing immediate rash, facial/tongue/throat swelling, SOB or lightheadedness with hypotension: Unknown Has patient had a PCN reaction causing severe rash involving mucus membranes or skin necrosis: Unknown Has patient had a PCN reaction that required hospitalization: Unknown Has patient had a PCN reaction occurring within the last 10 years: No If all of the above answers are "NO", then may proceed with Cephalosporin use.   Dapagliflozin-metformin Hcl Er Diarrhea, Other (See Comments)   Metformin Hcl Diarrhea, Other (See Comments)        Medication List     TAKE these medications    blood glucose meter kit and supplies Kit Dispense based on patient and insurance preference. Use up to four times daily as directed. (FOR ICD-9 250.00, 250.01).   glimepiride 1 MG tablet Commonly known as: AMARYL Take 2 tablets (2 mg total) by mouth 2 (two) times daily. What changed:  how much to take when to take this   levofloxacin 750 MG tablet Commonly known as: LEVAQUIN Take 1 tablet (750 mg total) by mouth daily for 4 days.   Olmesartan-amLODIPine-HCTZ 40-10-25 MG Tabs TAKE ONE TABLET BY MOUTH DAILY   oxyCODONE 5 MG immediate release tablet Commonly known as: Oxy IR/ROXICODONE Take 1-2 tablets (5-10 mg total) by mouth every 6 (six) hours as needed for severe pain.   polyethylene glycol 17 g packet Commonly known as: MIRALAX / GLYCOLAX Take 17 g by mouth daily as needed for mild constipation.   saccharomyces boulardii 250 MG capsule Commonly known as: FLORASTOR Take 1 capsule (250 mg total) by mouth 2 (two) times daily for 7 days.   spironolactone 100 MG tablet Commonly known as: ALDACTONE TAKE ONE TABLET BY MOUTH DAILY  Discharge Care Instructions  (From admission, onward)           Start      Ordered   12/23/20 0000  Discharge wound care:       Comments: Wound care  2 times daily      Comments: Cleanse with NS, pat gently dry. Fill defects with saline moistened roll gauze, top with dry gauze, ABD pads and secure with mesh underwear. Change PRN for soiling, otherwise twice daily.   12/23/20 [place] Follow up on 01/09/2021.   Specialty: General Surgery Why: 01/09/2021 at 9:45AM. Please bring a copy of your photo ID and insurance card to the appointment. Please arrive 30 minutes prior to your appointment for paperwork. Contact information: [place] [address] [phone]         Care, [facility] Follow up.   Specialty: Home Health Services Why: RN will be out to work with you on Monday or Tuesday.  They will call ahead Contact information: [address] [place]  [phone]                 TOTAL DISCHARGE TIME: 35 minutes  [place]  [facility] Pager on [email or web address]  12/24/2020, 1:10 PM

## 2020-12-23 NOTE — Progress Notes (Signed)
3 Days Post-Op   Subjective/Chief Complaint: She reports no pain today Tolerated showering   Objective: Vital signs in last 24 hours: Temp:  [98.3 F (36.8 C)-99.1 F (37.3 C)] 98.3 F (36.8 C) (07/23 0428) Pulse Rate:  [67-73] 67 (07/23 0428) Resp:  [18-20] 18 (07/23 0428) BP: (145-159)/(60-66) 159/66 (07/23 0428) SpO2:  [99 %-100 %] 99 % (07/23 0428) Last BM Date: 12/21/20  Intake/Output from previous day: No intake/output data recorded. Intake/Output this shift: No intake/output data recorded.  Exam: Awake and alert, in NAD Dressings left in place  Lab Results:  Recent Labs    12/21/20 0332 12/22/20 0010  WBC 17.3* 11.6*  HGB 10.0* 11.9*  HCT 31.4* 36.7  PLT 177 172   BMET Recent Labs    12/21/20 0332 12/22/20 0010  NA 136 134*  K 4.5 4.2  CL 103 102  CO2 22 24  GLUCOSE 157* 221*  BUN 17 19  CREATININE 0.97 1.09*  CALCIUM 10.1 10.3   PT/INR No results for input(s): LABPROT, INR in the last 72 hours. ABG No results for input(s): PHART, HCO3 in the last 72 hours.  Invalid input(s): PCO2, PO2  Studies/Results: No results found.  Anti-infectives: Anti-infectives (From admission, onward)    Start     Dose/Rate Route Frequency Ordered Stop   12/22/20 1400  levofloxacin (LEVAQUIN) tablet 750 mg        750 mg Oral Daily 12/22/20 1034 12/27/20 0959   12/19/20 2200  vancomycin (VANCOREADY) IVPB 2000 mg/400 mL  Status:  Discontinued        2,000 mg 200 mL/hr over 120 Minutes Intravenous Every 24 hours 12/19/20 1045 12/22/20 0644   12/18/20 1700  ceFEPIme (MAXIPIME) 2 g in sodium chloride 0.9 % 100 mL IVPB  Status:  Discontinued        2 g 200 mL/hr over 30 Minutes Intravenous Every 8 hours 12/18/20 1032 12/22/20 1034   12/17/20 2200  vancomycin (VANCOREADY) IVPB 1500 mg/300 mL  Status:  Discontinued        1,500 mg 150 mL/hr over 120 Minutes Intravenous Every 24 hours 12/17/20 1110 12/19/20 1045   12/17/20 1000  ceFEPIme (MAXIPIME) 2 g in sodium  chloride 0.9 % 100 mL IVPB  Status:  Discontinued        2 g 200 mL/hr over 30 Minutes Intravenous Every 12 hours 12/16/20 2203 12/18/20 1032   12/16/20 2202  vancomycin variable dose per unstable renal function (pharmacist dosing)  Status:  Discontinued         Does not apply See admin instructions 12/16/20 2203 12/17/20 1113   12/16/20 2030  ceFEPIme (MAXIPIME) 2 g in sodium chloride 0.9 % 100 mL IVPB        2 g 200 mL/hr over 30 Minutes Intravenous  Once 12/16/20 2015 12/16/20 2111   12/16/20 2015  vancomycin (VANCOCIN) IVPB 1000 mg/200 mL premix        1,000 mg 200 mL/hr over 60 Minutes Intravenous Every hour 12/16/20 2009 12/16/20 2328   12/16/20 1515  doxycycline (VIBRA-TABS) tablet 100 mg        100 mg Oral  Once 12/16/20 1506 12/16/20 1556       Assessment/Plan:  POD #3 s/p I&D by Dr. Carolynne Edouard for L labial abscess  See recommendations from yesterday regarding antibiotics and follow up at discharge   Abigail Miyamoto MD 12/23/2020

## 2020-12-23 NOTE — Progress Notes (Signed)
Educated patient on the steps of managing her wound, including changing the dressing as well as prepping her supplies using clean technique. She verbalized understanding of the procedure and states that she feels comfortable enough to do it at home moving forward. Supplies gathered for the dressing change for a few days until home health is able to see her.

## 2020-12-25 LAB — AEROBIC/ANAEROBIC CULTURE W GRAM STAIN (SURGICAL/DEEP WOUND)

## 2021-01-06 ENCOUNTER — Other Ambulatory Visit: Payer: Self-pay | Admitting: Family Medicine

## 2021-01-06 NOTE — Telephone Encounter (Signed)
Patient had previously stated she needed to find a diff provider due to schedule availability. She hasn't been in for visit in over a year. Needs visit for further refills or if she has started seeing a new provider then they should get request.

## 2021-01-22 ENCOUNTER — Telehealth: Payer: Self-pay | Admitting: Family Medicine

## 2021-01-22 DIAGNOSIS — E871 Hypo-osmolality and hyponatremia: Secondary | ICD-10-CM

## 2021-01-22 DIAGNOSIS — E1169 Type 2 diabetes mellitus with other specified complication: Secondary | ICD-10-CM

## 2021-01-22 DIAGNOSIS — D72829 Elevated white blood cell count, unspecified: Secondary | ICD-10-CM

## 2021-01-22 DIAGNOSIS — I10 Essential (primary) hypertension: Secondary | ICD-10-CM

## 2021-01-22 NOTE — Telephone Encounter (Signed)
I actually thought she had dismissed herself from my care - see phone note on 5/2 when she said she would be finding a new doctor.   *she has had a lot of bloodwork done in the last months due to ER visits.  *not sure if there is something specific that she is wanting done, but I frequently will do labs prior to visit so we can review together. Only thing she is really due for is CMP (dx hyponatremia, hypertension), lipid panel (dx hyperlipidemia), and repeat cbc with diff (dx leukocytosis).

## 2021-01-22 NOTE — Telephone Encounter (Signed)
Lab orders entered as below.

## 2021-01-22 NOTE — Telephone Encounter (Signed)
Patient called in asking for an appointment. She was given the information from Dr. Dorita Fray response about refills and that she needs an appointment. Patient call was transferred to me to handle. 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 She stated that she was having BP issues and needed a regular appointment with labs. She requested that the labs be completed before she comes in. I explained to her that she would need to be seen first to determine what labs she needs. She did not agree with my response and asked that Dr. Hassan Rowan to call her. I explained that the Dr. Hassan Rowan is currently in with patients and that she will need to discuss this during her appointment on Wednesday.

## 2021-01-23 ENCOUNTER — Other Ambulatory Visit (INDEPENDENT_AMBULATORY_CARE_PROVIDER_SITE_OTHER): Payer: BC Managed Care – PPO

## 2021-01-23 ENCOUNTER — Other Ambulatory Visit: Payer: Self-pay

## 2021-01-23 DIAGNOSIS — D72829 Elevated white blood cell count, unspecified: Secondary | ICD-10-CM | POA: Diagnosis not present

## 2021-01-23 DIAGNOSIS — E1169 Type 2 diabetes mellitus with other specified complication: Secondary | ICD-10-CM

## 2021-01-23 DIAGNOSIS — I10 Essential (primary) hypertension: Secondary | ICD-10-CM

## 2021-01-23 DIAGNOSIS — E871 Hypo-osmolality and hyponatremia: Secondary | ICD-10-CM

## 2021-01-23 DIAGNOSIS — E785 Hyperlipidemia, unspecified: Secondary | ICD-10-CM

## 2021-01-23 LAB — COMPREHENSIVE METABOLIC PANEL
ALT: 14 U/L (ref 0–35)
AST: 16 U/L (ref 0–37)
Albumin: 4.5 g/dL (ref 3.5–5.2)
Alkaline Phosphatase: 54 U/L (ref 39–117)
BUN: 40 mg/dL — ABNORMAL HIGH (ref 6–23)
CO2: 23 mEq/L (ref 19–32)
Calcium: 11.3 mg/dL — ABNORMAL HIGH (ref 8.4–10.5)
Chloride: 105 mEq/L (ref 96–112)
Creatinine, Ser: 1.77 mg/dL — ABNORMAL HIGH (ref 0.40–1.20)
GFR: 32.1 mL/min — ABNORMAL LOW (ref >60.00)
Glucose, Bld: 135 mg/dL — ABNORMAL HIGH (ref 70–99)
Potassium: 5 mEq/L (ref 3.5–5.1)
Sodium: 136 mEq/L (ref 135–145)
Total Bilirubin: 0.4 mg/dL (ref 0.2–1.2)
Total Protein: 7.4 g/dL (ref 6.0–8.3)

## 2021-01-23 LAB — CBC WITH DIFFERENTIAL/PLATELET
Basophils Absolute: 0 10*3/uL (ref 0.0–0.1)
Basophils Relative: 0.7 % (ref 0.0–3.0)
Eosinophils Absolute: 0.3 10*3/uL (ref 0.0–0.7)
Eosinophils Relative: 3.7 % (ref 0.0–5.0)
HCT: 36.8 % (ref 36.0–46.0)
Hemoglobin: 12.2 g/dL (ref 12.0–15.0)
Lymphocytes Relative: 21.5 % (ref 12.0–46.0)
Lymphs Abs: 1.6 10*3/uL (ref 0.7–4.0)
MCHC: 33.1 g/dL (ref 30.0–36.0)
MCV: 91.2 fl (ref 78.0–100.0)
Monocytes Absolute: 0.6 10*3/uL (ref 0.1–1.0)
Monocytes Relative: 8.2 % (ref 3.0–12.0)
Neutro Abs: 4.8 10*3/uL (ref 1.4–7.7)
Neutrophils Relative %: 65.9 % (ref 43.0–77.0)
Platelets: 200 10*3/uL (ref 150.0–400.0)
RBC: 4.03 Mil/uL (ref 3.87–5.11)
RDW: 13.2 % (ref 11.5–15.5)
WBC: 7.2 10*3/uL (ref 4.0–10.5)

## 2021-01-23 LAB — LIPID PANEL
Cholesterol: 205 mg/dL — ABNORMAL HIGH (ref 0–200)
HDL: 46.9 mg/dL (ref >39.00)
LDL Cholesterol: 131 mg/dL — ABNORMAL HIGH (ref 0–99)
NonHDL: 158.31
Total CHOL/HDL Ratio: 4
Triglycerides: 136 mg/dL (ref 0.0–149.0)
VLDL: 27.2 mg/dL (ref 0.0–40.0)

## 2021-01-24 ENCOUNTER — Encounter: Payer: Self-pay | Admitting: Family Medicine

## 2021-01-24 ENCOUNTER — Ambulatory Visit (INDEPENDENT_AMBULATORY_CARE_PROVIDER_SITE_OTHER): Payer: BC Managed Care – PPO | Admitting: Family Medicine

## 2021-01-24 VITALS — BP 130/70 | HR 91 | Temp 98.0°F | Ht 71.0 in | Wt 375.1 lb

## 2021-01-24 DIAGNOSIS — IMO0002 Reserved for concepts with insufficient information to code with codable children: Secondary | ICD-10-CM

## 2021-01-24 DIAGNOSIS — E118 Type 2 diabetes mellitus with unspecified complications: Secondary | ICD-10-CM | POA: Diagnosis not present

## 2021-01-24 DIAGNOSIS — G4733 Obstructive sleep apnea (adult) (pediatric): Secondary | ICD-10-CM | POA: Diagnosis not present

## 2021-01-24 DIAGNOSIS — E1165 Type 2 diabetes mellitus with hyperglycemia: Secondary | ICD-10-CM

## 2021-01-24 DIAGNOSIS — I152 Hypertension secondary to endocrine disorders: Secondary | ICD-10-CM

## 2021-01-24 DIAGNOSIS — E1169 Type 2 diabetes mellitus with other specified complication: Secondary | ICD-10-CM | POA: Diagnosis not present

## 2021-01-24 DIAGNOSIS — E1159 Type 2 diabetes mellitus with other circulatory complications: Secondary | ICD-10-CM

## 2021-01-24 DIAGNOSIS — E785 Hyperlipidemia, unspecified: Secondary | ICD-10-CM

## 2021-01-24 DIAGNOSIS — R7989 Other specified abnormal findings of blood chemistry: Secondary | ICD-10-CM

## 2021-01-24 NOTE — Patient Instructions (Addendum)
Let me know if your blood pressure is regularly running less than 120/70 OR if you are feeling symptomatic with low pressures less than 125/75.   Keep working on your healthy eating; remember to keep portions in mind so that you are limiting calories to 1500 or less. Keep working on staying active and regular exercise.
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
## 2021-02-16 ENCOUNTER — Ambulatory Visit
Admission: EM | Admit: 2021-02-16 | Discharge: 2021-02-16 | Disposition: A | Discharge status: Home / Self Care | Payer: BC Managed Care – PPO | Attending: Urgent Care | Admitting: Urgent Care

## 2021-02-16 ENCOUNTER — Ambulatory Visit (INDEPENDENT_AMBULATORY_CARE_PROVIDER_SITE_OTHER): Payer: BC Managed Care – PPO

## 2021-02-16 ENCOUNTER — Other Ambulatory Visit: Payer: Self-pay

## 2021-02-16 DIAGNOSIS — M546 Pain in thoracic spine: Secondary | ICD-10-CM

## 2021-02-16 DIAGNOSIS — S39012A Strain of muscle, fascia and tendon of lower back, initial encounter: Secondary | ICD-10-CM

## 2021-02-16 DIAGNOSIS — M545 Low back pain, unspecified: Secondary | ICD-10-CM

## 2021-02-16 DIAGNOSIS — M549 Dorsalgia, unspecified: Secondary | ICD-10-CM

## 2021-02-16 MED ORDER — TRAMADOL HCL 50 MG PO TABS: 50.0000 mg | ORAL_TABLET | Freq: Four times a day (QID) | ORAL | 0 refills | Status: DC | PRN

## 2021-02-16 MED ORDER — CYCLOBENZAPRINE HCL 10 MG PO TABS: 10.0000 mg | ORAL_TABLET | Freq: Two times a day (BID) | ORAL | 0 refills | Status: DC | PRN

## 2021-02-16 NOTE — Discharge Instructions (Addendum)
Please schedule Tylenol at 500 mg - 650 mg once every 6 hours as needed for aches and pains. You can use 1000mg  for severe pain once every 8 hours as needed. If you still have pain despite taking Tylenol regularly, this is breakthrough pain.  You can use tramadol once every 6 hours for this.  Once your pain is better controlled, switch back to just Tylenol. You can use Flexeril as a muscle relaxant for your back as well.
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
## 2021-02-16 NOTE — Progress Notes (Signed)
Discussed result with patient and his mother.  Decided not to refer to the emergency room for rule out of a chest PE.  Will manage with supportive care as discussed in clinic, start prednisone course for asthma in the setting of COVID-19.

## 2021-02-18 ENCOUNTER — Other Ambulatory Visit: Payer: Self-pay | Admitting: Family Medicine

## 2021-02-19 ENCOUNTER — Ambulatory Visit (INDEPENDENT_AMBULATORY_CARE_PROVIDER_SITE_OTHER): Payer: BC Managed Care – PPO | Admitting: Family Medicine

## 2021-02-19 ENCOUNTER — Other Ambulatory Visit: Payer: Self-pay

## 2021-02-19 ENCOUNTER — Telehealth: Payer: Self-pay | Admitting: Family Medicine

## 2021-02-19 VITALS — BP 160/80 | HR 75 | Temp 98.2°F | Wt 371.5 lb

## 2021-02-19 DIAGNOSIS — I152 Hypertension secondary to endocrine disorders: Secondary | ICD-10-CM

## 2021-02-19 DIAGNOSIS — M545 Low back pain, unspecified: Secondary | ICD-10-CM

## 2021-02-19 DIAGNOSIS — E1159 Type 2 diabetes mellitus with other circulatory complications: Secondary | ICD-10-CM
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
## 2021-02-19 NOTE — Patient Instructions (Signed)
I will set up physical therapy referral  Consider reducing the flexeril to one half tablet.

## 2021-02-19 NOTE — Telephone Encounter (Signed)
Patient called today asking for an appointment with Dr. Hassan Rowan. Informed patient that the only appointments left for Dr. Hassan Rowan for today are virtual. Patient wanted in office visit.  Patient scheduled appointment with another provider for in office for today (9/19). Patient asked if she could speak to Dr. Hassan Rowan while she is in office today. Informed patient that a message could be sent back and someone would give her a call.  Patient says that she just has an important question to ask Dr. Hassan Rowan. She says another provider mentioned "something" in her chart and Dr. Hassan Rowan never spoke about it, although it is in her chart.  Patient would like a call back to ask Dr. Hassan Rowan her question.  Please advise.

## 2021-02-20 LAB — BASIC METABOLIC PANEL
BUN: 36 mg/dL — ABNORMAL HIGH (ref 6–23)
CO2: 24 mEq/L (ref 19–32)
Calcium: 10.5 mg/dL (ref 8.4–10.5)
Chloride: 109 mEq/L (ref 96–112)
Creatinine, Ser: 1.82 mg/dL — ABNORMAL HIGH (ref 0.40–1.20)
GFR: 31.03 mL/min — ABNORMAL LOW (ref >60.00)
Glucose, Bld: 124 mg/dL — ABNORMAL HIGH (ref 70–99)
Potassium: 4.9 mEq/L (ref 3.5–5.1)
Sodium: 139 mEq/L (ref 135–145)

## 2021-02-21 LAB — PTH, INTACT AND CALCIUM
Calcium: 10.6 mg/dL — ABNORMAL HIGH (ref 8.6–10.4)
PTH: 135 pg/mL — ABNORMAL HIGH (ref 16–77)

## 2021-02-21 NOTE — Telephone Encounter (Signed)
I spoke with this patient while she was in the office and we reviewed her bloodwork together. Urgent care doc told her that her kidneys were failing, but as we reviewed she just looked dehydrated on some labs and was due for a recheck which she completed on that day.   Jenna Stokes - wanted to send this as patient states she was told she couldn't see me in office on Monday because I had only "virtual" spots available. I want to make sure that all my "virtual" slots are also available to see people in person if they prefer. I know this was an understanding in the past, but we have new staff on board now. I ended up seeing a patient from a provider at a different office instead of being able to provide care for one of my own patients, so just wanted to make sure this was clarified.

## 2021-02-23 ENCOUNTER — Encounter: Payer: Self-pay | Admitting: Family Medicine

## 2021-02-23 DIAGNOSIS — IMO0002 Reserved for concepts with insufficient information to code with codable children: Secondary | ICD-10-CM

## 2021-02-23 DIAGNOSIS — R7989 Other specified abnormal findings of blood chemistry: Secondary | ICD-10-CM

## 2021-02-23 DIAGNOSIS — E1165 Type 2 diabetes mellitus with hyperglycemia: Secondary | ICD-10-CM

## 2021-02-23 NOTE — Telephone Encounter (Signed)
Thank you I will inform the staff

## 2021-02-26 NOTE — Progress Notes (Signed)
Patient informed of the message below and stated she will check her Mychart acct.  Patient requested a refill on Cyclobenzaprine, that was given by a provider at an urgent care and requested this be sent to Karin Golden at [facility].  Message sent to PCP.

## 2021-02-28 ENCOUNTER — Ambulatory Visit: Payer: BC Managed Care – PPO | Attending: Family Medicine | Admitting: Physical Therapy

## 2021-02-28 ENCOUNTER — Other Ambulatory Visit: Payer: Self-pay

## 2021-02-28 ENCOUNTER — Encounter: Payer: Self-pay | Admitting: Physical Therapy

## 2021-02-28 DIAGNOSIS — M62830 Muscle spasm of back: Secondary | ICD-10-CM | POA: Diagnosis present

## 2021-02-28 DIAGNOSIS — M545 Low back pain, unspecified: Secondary | ICD-10-CM | POA: Insufficient documentation

## 2021-02-28 NOTE — Patient Instructions (Signed)
Access Code: [email or web address] URL: [email or web address] Date: 02/28/2021 Prepared by: [facility] [place]  Exercises Supine Lower Trunk Rotation - 2 x daily - 7 x weekly - 1 sets - 10 reps Seated Quadratus Lumborum Stretch in Chair - 2 x daily - 7 x weekly - 1 sets - 5 reps - 10 sec hold   [facility] [address] [place], [place] [address] Phone # [phone] Fax [phone]
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
## 2021-03-02 ENCOUNTER — Telehealth: Payer: Self-pay | Admitting: Family Medicine

## 2021-03-02 ENCOUNTER — Other Ambulatory Visit: Payer: BC Managed Care – PPO

## 2021-03-02 DIAGNOSIS — E119 Type 2 diabetes mellitus without complications: Secondary | ICD-10-CM

## 2021-03-02 NOTE — Telephone Encounter (Signed)
Patient called to reschedule lab and ask if A1C can be added to orders. Appointment is on 10/5 at 11:00am    Good callback number is [phone]     Please Advise

## 2021-03-05 NOTE — Telephone Encounter (Signed)
Left a detailed message at the patients cell number with the information below.  Lab order entered.

## 2021-03-07 ENCOUNTER — Telehealth: Payer: Self-pay | Admitting: Family Medicine

## 2021-03-07 ENCOUNTER — Other Ambulatory Visit: Payer: Self-pay

## 2021-03-07 ENCOUNTER — Ambulatory Visit: Payer: BC Managed Care – PPO | Attending: Family Medicine | Admitting: Physical Therapy

## 2021-03-07 ENCOUNTER — Other Ambulatory Visit (INDEPENDENT_AMBULATORY_CARE_PROVIDER_SITE_OTHER): Payer: BC Managed Care – PPO

## 2021-03-07 DIAGNOSIS — M545 Low back pain, unspecified: Secondary | ICD-10-CM | POA: Diagnosis present

## 2021-03-07 DIAGNOSIS — R293 Abnormal posture: Secondary | ICD-10-CM | POA: Insufficient documentation

## 2021-03-07 DIAGNOSIS — R252 Cramp and spasm: Secondary | ICD-10-CM | POA: Insufficient documentation

## 2021-03-07 DIAGNOSIS — R7989 Other specified abnormal findings of blood chemistry: Secondary | ICD-10-CM | POA: Diagnosis not present

## 2021-03-07 DIAGNOSIS — M62830 Muscle spasm of back: Secondary | ICD-10-CM | POA: Insufficient documentation

## 2021-03-07 DIAGNOSIS — IMO0002 Reserved for concepts with insufficient information to code with codable children: Secondary | ICD-10-CM

## 2021-03-07 DIAGNOSIS — N289 Disorder of kidney and ureter, unspecified: Secondary | ICD-10-CM

## 2021-03-07 LAB — MICROALBUMIN / CREATININE URINE RATIO
Creatinine,U: 150.9 mg/dL
Microalb Creat Ratio: 1.9 mg/g (ref 0.0–30.0)
Microalb, Ur: 2.8 mg/dL — ABNORMAL HIGH (ref 0.0–1.9)

## 2021-03-07 LAB — BASIC METABOLIC PANEL
BUN: 38 mg/dL — ABNORMAL HIGH (ref 6–23)
CO2: 25 mEq/L (ref 19–32)
Calcium: 10.9 mg/dL — ABNORMAL HIGH (ref 8.4–10.5)
Chloride: 104 mEq/L (ref 96–112)
Creatinine, Ser: 1.84 mg/dL — ABNORMAL HIGH (ref 0.40–1.20)
GFR: 30.61 mL/min — ABNORMAL LOW (ref >60.00)
Glucose, Bld: 177 mg/dL — ABNORMAL HIGH (ref 70–99)
Potassium: 4.6 mEq/L (ref 3.5–5.1)
Sodium: 136 mEq/L (ref 135–145)

## 2021-03-07 NOTE — Telephone Encounter (Signed)
Patient is requesting a phone call back regarding her last appointment.  Patient could be contacted at [phone].  Please advise.

## 2021-03-07 NOTE — Telephone Encounter (Signed)
Forms completed. Spoke with patient. She is working with PT now. Feels good right after massage/work today, but yesterday was in pain and is in more pain after PT typically. Then takes muscle relaxer which makes her drowsy. Would like to be feeling better and off med before driving, which is reasonable. Lower back pain can be intense and small things flare this (household activity). I have completed fmla paperwork which she will pick up Friday. She did bloodwork today. We will keep her out of work through 03/17/21 which gives her a few more session of pt and time to work on exercises and stretches at home.   She has follow up visit w dr. Horald Pollen next month.

## 2021-03-07 NOTE — Telephone Encounter (Signed)
FMLA forms to be filled out--placed in dr's folder.  Patient made comment on her charge form that she needed to touch base with the dr.  Call [phone] upon completion.
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
## 2021-03-09 ENCOUNTER — Encounter: Payer: Self-pay | Admitting: Physical Therapy

## 2021-03-09 ENCOUNTER — Other Ambulatory Visit: Payer: Self-pay

## 2021-03-09 ENCOUNTER — Ambulatory Visit: Payer: BC Managed Care – PPO | Admitting: Physical Therapy

## 2021-03-09 DIAGNOSIS — R252 Cramp and spasm: Secondary | ICD-10-CM | POA: Diagnosis not present

## 2021-03-09 DIAGNOSIS — M62830 Muscle spasm of back: Secondary | ICD-10-CM

## 2021-03-09 DIAGNOSIS — R293 Abnormal posture: Secondary | ICD-10-CM

## 2021-03-09 DIAGNOSIS — M545 Low back pain, unspecified: Secondary | ICD-10-CM
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
## 2021-03-09 NOTE — Patient Instructions (Signed)
Access Code: [email or web address] URL: [email or web address] Date: 03/09/2021 Prepared by: Loistine Simas Daeshawn Redmann  Exercises Supine Lower Trunk Rotation - 2 x daily - 7 x weekly - 1 sets - 10 reps Seated Quadratus Lumborum Stretch in Chair - 2 x daily - 7 x weekly - 1 sets - 5 reps - 10 sec hold Standing Shoulder Row with Anchored Resistance - 1 x daily - 7 x weekly - 1 sets - 10 reps Standing Shoulder Extension with Resistance - 1 x daily - 7 x weekly - 1 sets - 10 reps

## 2021-03-13 ENCOUNTER — Other Ambulatory Visit: Payer: Self-pay

## 2021-03-13 ENCOUNTER — Ambulatory Visit: Payer: BC Managed Care – PPO | Admitting: Physical Therapy

## 2021-03-13 ENCOUNTER — Encounter: Payer: Self-pay | Admitting: Physical Therapy

## 2021-03-13 DIAGNOSIS — R252 Cramp and spasm: Secondary | ICD-10-CM | POA: Diagnosis not present

## 2021-03-13 DIAGNOSIS — M62830 Muscle spasm of back: Secondary | ICD-10-CM

## 2021-03-13 DIAGNOSIS — R293 Abnormal posture: Secondary | ICD-10-CM

## 2021-03-13 DIAGNOSIS — M545 Low back pain, unspecified: Secondary | ICD-10-CM
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
## 2021-03-14 ENCOUNTER — Encounter: Payer: Self-pay | Admitting: Physical Therapy

## 2021-03-14 ENCOUNTER — Ambulatory Visit: Payer: BC Managed Care – PPO | Admitting: Physical Therapy

## 2021-03-14 DIAGNOSIS — M62830 Muscle spasm of back: Secondary | ICD-10-CM

## 2021-03-14 DIAGNOSIS — R252 Cramp and spasm: Secondary | ICD-10-CM | POA: Diagnosis not present

## 2021-03-14 DIAGNOSIS — M545 Low back pain, unspecified: Secondary | ICD-10-CM

## 2021-03-14 DIAGNOSIS — R293 Abnormal posture: Secondary | ICD-10-CM
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
## 2021-03-16 ENCOUNTER — Other Ambulatory Visit: Payer: Self-pay

## 2021-03-16 ENCOUNTER — Ambulatory Visit: Payer: BC Managed Care – PPO | Admitting: Family Medicine

## 2021-03-16 ENCOUNTER — Telehealth: Payer: BC Managed Care – PPO | Admitting: Family Medicine

## 2021-03-16 DIAGNOSIS — R42 Dizziness and giddiness: Secondary | ICD-10-CM

## 2021-03-16 NOTE — Progress Notes (Signed)
Virtual Visit via Video Note   Patient was set up for virtual visit. Attempted to call x 3, attempted to send video visit link and did not get response.

## 2021-03-19 ENCOUNTER — Encounter: Payer: BC Managed Care – PPO | Admitting: Physical Therapy

## 2021-03-20 ENCOUNTER — Encounter: Payer: Self-pay | Admitting: Family Medicine

## 2021-03-21 ENCOUNTER — Telehealth: Payer: Self-pay

## 2021-03-21 ENCOUNTER — Ambulatory Visit: Payer: BC Managed Care – PPO

## 2021-03-21 NOTE — Telephone Encounter (Signed)
Pt reports that she has been exposed to COVID and is awaiting test results.  She thought she had canceled this appt.  She will let us know about her testing.

## 2021-03-23 ENCOUNTER — Telehealth: Payer: Self-pay | Admitting: Family Medicine

## 2021-03-23 ENCOUNTER — Ambulatory Visit: Payer: BC Managed Care – PPO | Admitting: Physical Therapy

## 2021-03-23 NOTE — Telephone Encounter (Signed)
Patient called to see if she could be released back to work for Monday 10/24. Patient states she is able to manage pain with tylenol and aleve and feels she is in a stable state to return. Patient will make an appointment at a later date to discuss pain management. Patient states letter can be uploaded to mychart.        Good callback number is [phone]       Please Advise

## 2021-03-23 NOTE — Telephone Encounter (Signed)
Letter completed and message sent via Mychart.

## 2021-03-23 NOTE — Telephone Encounter (Signed)
Pt call again and stated she want Randa Evens to give her a call back.

## 2021-03-26 ENCOUNTER — Ambulatory Visit: Payer: BC Managed Care – PPO | Admitting: Physical Therapy

## 2021-03-26 ENCOUNTER — Encounter: Payer: Self-pay | Admitting: Physical Therapy

## 2021-03-26 ENCOUNTER — Encounter: Payer: Self-pay | Admitting: Family Medicine

## 2021-03-26 ENCOUNTER — Other Ambulatory Visit: Payer: Self-pay

## 2021-03-26 DIAGNOSIS — R252 Cramp and spasm: Secondary | ICD-10-CM | POA: Diagnosis not present

## 2021-03-26 DIAGNOSIS — M545 Low back pain, unspecified: Secondary | ICD-10-CM

## 2021-03-26 DIAGNOSIS — M62830 Muscle spasm of back: Secondary | ICD-10-CM

## 2021-03-26 DIAGNOSIS — R293 Abnormal posture: Secondary | ICD-10-CM
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
## 2021-03-28 ENCOUNTER — Encounter: Payer: BC Managed Care – PPO | Admitting: Physical Therapy

## 2021-03-29 NOTE — Telephone Encounter (Signed)
Pt is calling an needs a revise letter that she was out of work from oct 17 through oct 24. Pt returned to work on oct 25. Pt needs the letter to states she does not have any work restrictions

## 2021-03-29 NOTE — Telephone Encounter (Signed)
Ok for letter

## 2021-03-30 ENCOUNTER — Ambulatory Visit: Payer: BC Managed Care – PPO | Admitting: Physical Therapy

## 2021-04-02 ENCOUNTER — Telehealth: Payer: Self-pay

## 2021-04-02 ENCOUNTER — Telehealth: Payer: Self-pay | Admitting: Physical Therapy

## 2021-04-02 ENCOUNTER — Ambulatory Visit: Payer: BC Managed Care – PPO | Admitting: Physical Therapy

## 2021-04-02 NOTE — Telephone Encounter (Signed)
Patient would like call back to discuss referral for MRI

## 2021-04-02 NOTE — Telephone Encounter (Signed)
Left a message at the patient's cell number to call back with detailed information as to what she is requesting in regards to a referral as in does she want the PCP to place an order or how we can help.  Message left stating PCP is out of the office and the message can be sent to her for review on 11/7.

## 2021-04-02 NOTE — Telephone Encounter (Signed)
PTA called pt for missed appt. Left message to call our office. Ane Payment, PTA @TODAY @ 11:59 AM

## 2021-04-04 ENCOUNTER — Telehealth: Payer: Self-pay | Admitting: Physical Therapy

## 2021-04-04 ENCOUNTER — Ambulatory Visit: Payer: BC Managed Care – PPO | Attending: Family Medicine | Admitting: Physical Therapy

## 2021-04-04 DIAGNOSIS — M62830 Muscle spasm of back: Secondary | ICD-10-CM | POA: Insufficient documentation

## 2021-04-04 DIAGNOSIS — M545 Low back pain, unspecified: Secondary | ICD-10-CM | POA: Insufficient documentation

## 2021-04-04 DIAGNOSIS — R293 Abnormal posture: Secondary | ICD-10-CM | POA: Insufficient documentation

## 2021-04-04 DIAGNOSIS — R252 Cramp and spasm: Secondary | ICD-10-CM | POA: Insufficient documentation

## 2021-04-04 NOTE — Telephone Encounter (Signed)
PT left VM for Pt to return a call to our office.  Pt missed 2nd consecutive PT visit and today was re-evaluation and last scheduled visit.  Karlye Ihrig, PT 04/04/21 12:08 PM

## 2021-04-16 ENCOUNTER — Encounter: Payer: Self-pay | Admitting: Family Medicine

## 2021-04-25 ENCOUNTER — Other Ambulatory Visit: Payer: Self-pay

## 2021-04-25 ENCOUNTER — Ambulatory Visit (INDEPENDENT_AMBULATORY_CARE_PROVIDER_SITE_OTHER): Payer: BC Managed Care – PPO

## 2021-04-25 ENCOUNTER — Encounter: Payer: Self-pay | Admitting: Family Medicine

## 2021-04-25 ENCOUNTER — Ambulatory Visit (INDEPENDENT_AMBULATORY_CARE_PROVIDER_SITE_OTHER): Payer: BC Managed Care – PPO | Admitting: Family Medicine

## 2021-04-25 VITALS — HR 62 | Temp 98.5°F | Ht 71.0 in | Wt 366.7 lb

## 2021-04-25 DIAGNOSIS — R0781 Pleurodynia: Secondary | ICD-10-CM

## 2021-04-25 DIAGNOSIS — M545 Low back pain, unspecified: Secondary | ICD-10-CM

## 2021-04-25 DIAGNOSIS — R7989 Other specified abnormal findings of blood chemistry: Secondary | ICD-10-CM | POA: Diagnosis not present

## 2021-04-25 DIAGNOSIS — E119 Type 2 diabetes mellitus without complications: Secondary | ICD-10-CM | POA: Diagnosis not present

## 2021-04-25 LAB — COMPREHENSIVE METABOLIC PANEL
ALT: 18 U/L (ref 0–35)
AST: 14 U/L (ref 0–37)
Albumin: 4.7 g/dL (ref 3.5–5.2)
Alkaline Phosphatase: 55 U/L (ref 39–117)
BUN: 24 mg/dL — ABNORMAL HIGH (ref 6–23)
CO2: 28 mEq/L (ref 19–32)
Calcium: 10.7 mg/dL — ABNORMAL HIGH (ref 8.4–10.5)
Chloride: 105 mEq/L (ref 96–112)
Creatinine, Ser: 1.66 mg/dL — ABNORMAL HIGH (ref 0.40–1.20)
GFR: 34.61 mL/min — ABNORMAL LOW (ref >60.00)
Glucose, Bld: 99 mg/dL (ref 70–99)
Potassium: 4.4 mEq/L (ref 3.5–5.1)
Sodium: 140 mEq/L (ref 135–145)
Total Bilirubin: 0.4 mg/dL (ref 0.2–1.2)
Total Protein: 7.7 g/dL (ref 6.0–8.3)

## 2021-04-25 LAB — HEMOGLOBIN A1C: Hgb A1c MFr Bld: 6.9 % — ABNORMAL HIGH (ref 4.6–6.5)

## 2021-04-25 MED ORDER — SPIRONOLACTONE 100 MG PO TABS: 100.0000 mg | ORAL_TABLET | Freq: Every day | ORAL | 1 refills | Status: DC
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
## 2021-05-02 MED ORDER — TIRZEPATIDE 2.5 MG/0.5ML ~~LOC~~ SOAJ: 2.5000 mg | SUBCUTANEOUS | 0 refills | Status: DC

## 2021-05-02 NOTE — Addendum Note (Signed)
Addended by: Johnella Moloney on: 05/02/2021 04:56 PM   Modules accepted: Orders

## 2021-06-01 ENCOUNTER — Telehealth: Payer: Self-pay | Admitting: Family Medicine

## 2021-06-01 NOTE — Telephone Encounter (Signed)
Patient informed of the message below, she stated she read the info and will be fine to administer.  Follow up appt scheduled for 12/30.

## 2021-06-01 NOTE — Telephone Encounter (Signed)
Patient came by to pick up sample mounjaro that was reserved a month ago. She had to leave before anyone was able to explain how to use this. I do feel that the sample box is pretty self explanatory, but let us know if questions.   *advise to stop the glimepiride once starting the mounjaro. Would like a one month in office follow up. Advise again that medication will make her feel more full. She will tolerate this better if eating healthy (ie more indigestion if eating fattier, fried, highcarb foods).

## 2021-07-02 ENCOUNTER — Ambulatory Visit: Payer: BC Managed Care – PPO | Admitting: Family Medicine

## 2021-07-23 ENCOUNTER — Ambulatory Visit: Payer: BC Managed Care – PPO | Admitting: Family Medicine

## 2021-07-23 NOTE — Progress Notes (Deleted)
NO SHOW

## 2021-07-27 ENCOUNTER — Other Ambulatory Visit: Payer: Self-pay | Admitting: Family Medicine

## 2021-08-31 ENCOUNTER — Ambulatory Visit (INDEPENDENT_AMBULATORY_CARE_PROVIDER_SITE_OTHER): Payer: BC Managed Care – PPO | Admitting: Family Medicine

## 2021-08-31 ENCOUNTER — Encounter: Payer: Self-pay | Admitting: Family Medicine

## 2021-08-31 VITALS — BP 132/80 | HR 80 | Temp 98.5°F | Ht 71.0 in | Wt 364.4 lb

## 2021-08-31 DIAGNOSIS — I152 Hypertension secondary to endocrine disorders: Secondary | ICD-10-CM

## 2021-08-31 DIAGNOSIS — E1159 Type 2 diabetes mellitus with other circulatory complications: Secondary | ICD-10-CM | POA: Diagnosis not present

## 2021-08-31 DIAGNOSIS — E1165 Type 2 diabetes mellitus with hyperglycemia: Secondary | ICD-10-CM

## 2021-08-31 DIAGNOSIS — K5900 Constipation, unspecified: Secondary | ICD-10-CM

## 2021-08-31 DIAGNOSIS — Z1211 Encounter for screening for malignant neoplasm of colon: Secondary | ICD-10-CM

## 2021-08-31 DIAGNOSIS — E1100 Type 2 diabetes mellitus with hyperosmolarity without nonketotic hyperglycemic-hyperosmolar coma (NKHHC): Secondary | ICD-10-CM

## 2021-08-31 DIAGNOSIS — E785 Hyperlipidemia, unspecified: Secondary | ICD-10-CM

## 2021-08-31 DIAGNOSIS — E119 Type 2 diabetes mellitus without complications: Secondary | ICD-10-CM

## 2021-08-31 DIAGNOSIS — E1169 Type 2 diabetes mellitus with other specified complication: Secondary | ICD-10-CM

## 2021-08-31 LAB — CBC WITH DIFFERENTIAL/PLATELET
Basophils Absolute: 0 10*3/uL (ref 0.0–0.1)
Basophils Relative: 0.4 % (ref 0.0–3.0)
Eosinophils Absolute: 0.4 10*3/uL (ref 0.0–0.7)
Eosinophils Relative: 3.9 % (ref 0.0–5.0)
HCT: 37 % (ref 36.0–46.0)
Hemoglobin: 12.2 g/dL (ref 12.0–15.0)
Lymphocytes Relative: 15.9 % (ref 12.0–46.0)
Lymphs Abs: 1.5 10*3/uL (ref 0.7–4.0)
MCHC: 33 g/dL (ref 30.0–36.0)
MCV: 90.9 fl (ref 78.0–100.0)
Monocytes Absolute: 0.6 10*3/uL (ref 0.1–1.0)
Monocytes Relative: 7.1 % (ref 3.0–12.0)
Neutro Abs: 6.7 10*3/uL (ref 1.4–7.7)
Neutrophils Relative %: 72.7 % (ref 43.0–77.0)
Platelets: 192 10*3/uL (ref 150.0–400.0)
RBC: 4.07 Mil/uL (ref 3.87–5.11)
RDW: 13.3 % (ref 11.5–15.5)
WBC: 9.2 10*3/uL (ref 4.0–10.5)

## 2021-08-31 LAB — COMPREHENSIVE METABOLIC PANEL
ALT: 16 U/L (ref 0–35)
AST: 18 U/L (ref 0–37)
Albumin: 4.5 g/dL (ref 3.5–5.2)
Alkaline Phosphatase: 57 U/L (ref 39–117)
BUN: 30 mg/dL — ABNORMAL HIGH (ref 6–23)
CO2: 26 mEq/L (ref 19–32)
Calcium: 10.8 mg/dL — ABNORMAL HIGH (ref 8.4–10.5)
Chloride: 105 mEq/L (ref 96–112)
Creatinine, Ser: 1.63 mg/dL — ABNORMAL HIGH (ref 0.40–1.20)
GFR: 35.28 mL/min — ABNORMAL LOW (ref >60.00)
Glucose, Bld: 103 mg/dL — ABNORMAL HIGH (ref 70–99)
Potassium: 4.6 mEq/L (ref 3.5–5.1)
Sodium: 138 mEq/L (ref 135–145)
Total Bilirubin: 0.4 mg/dL (ref 0.2–1.2)
Total Protein: 7.4 g/dL (ref 6.0–8.3)

## 2021-08-31 LAB — LIPID PANEL
Cholesterol: 192 mg/dL (ref 0–200)
HDL: 37 mg/dL — ABNORMAL LOW (ref >39.00)
NonHDL: 155.03
Total CHOL/HDL Ratio: 5
Triglycerides: 248 mg/dL — ABNORMAL HIGH (ref 0.0–149.0)
VLDL: 49.6 mg/dL — ABNORMAL HIGH (ref 0.0–40.0)

## 2021-08-31 LAB — HEMOGLOBIN A1C: Hgb A1c MFr Bld: 7.4 % — ABNORMAL HIGH (ref 4.6–6.5)

## 2021-08-31 LAB — LDL CHOLESTEROL, DIRECT: Direct LDL: 107 mg/dL
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
## 2021-08-31 NOTE — Addendum Note (Signed)
Addended by: Marian Sorrow D on: 08/31/2021 10:36 AM ? ? Modules accepted: Orders ? ?

## 2021-08-31 NOTE — Patient Instructions (Addendum)
I will contact you once we get a sample of the mounjaro. If you haven't heard from me in 2 weeks, message me through mychart to check in.  ? ? ?*try miralax 1/2 dose three times/week to see if this helps to make stools more regular. If too loose, then back off. If no improvement after a week then can increase to full dose 3 times/week.  ?

## 2021-09-04 MED ORDER — ROSUVASTATIN CALCIUM 5 MG PO TABS: 5.0000 mg | ORAL_TABLET | Freq: Every day | ORAL | 1 refills | Status: DC

## 2021-09-04 NOTE — Addendum Note (Signed)
Addended by: Johnella Moloney on: 09/04/2021 11:34 AM ? ? Modules accepted: Orders ? ?

## 2021-09-07 ENCOUNTER — Other Ambulatory Visit: Payer: Self-pay | Admitting: Family Medicine

## 2021-09-07 MED ORDER — OLMESARTAN-AMLODIPINE-HCTZ 40-10-25 MG PO TABS: 1.0000 | ORAL_TABLET | Freq: Every day | ORAL | 1 refills | Status: DC

## 2021-09-07 MED ORDER — GLIMEPIRIDE 1 MG PO TABS: 1.0000 mg | ORAL_TABLET | Freq: Two times a day (BID) | ORAL | 1 refills | Status: DC

## 2021-09-07 MED ORDER — SPIRONOLACTONE 100 MG PO TABS: 100.0000 mg | ORAL_TABLET | Freq: Every day | ORAL | 1 refills | Status: DC

## 2022-01-09 ENCOUNTER — Encounter (INDEPENDENT_AMBULATORY_CARE_PROVIDER_SITE_OTHER): Payer: Self-pay

## 2022-03-22 ENCOUNTER — Other Ambulatory Visit: Payer: Self-pay | Admitting: Endocrinology

## 2022-03-22 DIAGNOSIS — E010 Iodine-deficiency related diffuse (endemic) goiter: Secondary | ICD-10-CM

## 2022-04-04 ENCOUNTER — Ambulatory Visit
Admission: RE | Admit: 2022-04-04 | Discharge: 2022-04-04 | Disposition: A | Discharge status: Home / Self Care | Payer: BC Managed Care – PPO | Source: Ambulatory Visit | Attending: Endocrinology | Admitting: Endocrinology

## 2022-04-04 DIAGNOSIS — E010 Iodine-deficiency related diffuse (endemic) goiter: Secondary | ICD-10-CM

## 2022-07-02 ENCOUNTER — Other Ambulatory Visit (HOSPITAL_COMMUNITY): Payer: Self-pay | Admitting: Endocrinology

## 2022-07-02 DIAGNOSIS — E210 Primary hyperparathyroidism: Secondary | ICD-10-CM

## 2022-07-26 ENCOUNTER — Encounter (HOSPITAL_COMMUNITY)
Admission: RE | Admit: 2022-07-26 | Discharge: 2022-07-26 | Disposition: A | Discharge status: Home / Self Care | Payer: BC Managed Care – PPO | Source: Ambulatory Visit | Attending: Endocrinology | Admitting: Endocrinology

## 2022-07-26 ENCOUNTER — Other Ambulatory Visit (HOSPITAL_COMMUNITY): Payer: Self-pay | Admitting: Endocrinology

## 2022-07-26 DIAGNOSIS — E210 Primary hyperparathyroidism: Secondary | ICD-10-CM

## 2022-07-26 MED ORDER — TECHNETIUM TC 99M SESTAMIBI GENERIC - CARDIOLITE: 22.5000 | Freq: Once | INTRAVENOUS | Status: DC | PRN

## 2022-07-26 MED ORDER — TECHNETIUM TC 99M SESTAMIBI GENERIC - CARDIOLITE: 21.0000 | Freq: Once | INTRAVENOUS | Status: DC | PRN

## 2022-07-26 MED ORDER — TECHNETIUM TC 99M SESTAMIBI GENERIC - CARDIOLITE
21.0000 | Freq: Once | INTRAVENOUS | Status: AC | PRN
  Administered 2022-07-26: 22.5 via INTRAVENOUS

## 2022-08-09 ENCOUNTER — Other Ambulatory Visit: Payer: Self-pay | Admitting: *Deleted

## 2022-08-09 NOTE — Telephone Encounter (Signed)
Pt hasn't been seen since March 2023, she needs an appt to establish with me -- if she schedules the appt then ok to give her enough until her appointment.

## 2022-08-19 ENCOUNTER — Other Ambulatory Visit: Payer: Self-pay | Admitting: *Deleted

## 2022-08-19 NOTE — Telephone Encounter (Signed)
Rx denial sent as patient needs an appt.

## 2022-09-26 ENCOUNTER — Other Ambulatory Visit (HOSPITAL_COMMUNITY): Payer: Self-pay

## 2022-09-26 MED ORDER — MOUNJARO 7.5 MG/0.5ML ~~LOC~~ SOAJ
7.5000 mg | SUBCUTANEOUS | 0 refills | Status: DC
  Filled 2022-09-26: qty 2, 28d supply, fill #0

## 2023-06-30 ENCOUNTER — Other Ambulatory Visit: Payer: Self-pay | Admitting: Family

## 2023-06-30 ENCOUNTER — Ambulatory Visit
Admission: RE | Admit: 2023-06-30 | Discharge: 2023-06-30 | Disposition: A | Discharge status: Home / Self Care | Payer: 59 | Source: Ambulatory Visit | Attending: Family | Admitting: Family

## 2023-06-30 DIAGNOSIS — R051 Acute cough: Secondary | ICD-10-CM

## 2023-07-08 ENCOUNTER — Other Ambulatory Visit: Payer: Self-pay

## 2023-07-08 ENCOUNTER — Emergency Department (HOSPITAL_BASED_OUTPATIENT_CLINIC_OR_DEPARTMENT_OTHER): Payer: 59

## 2023-07-08 ENCOUNTER — Emergency Department (HOSPITAL_BASED_OUTPATIENT_CLINIC_OR_DEPARTMENT_OTHER): Payer: 59 | Admitting: Radiology

## 2023-07-08 ENCOUNTER — Inpatient Hospital Stay (HOSPITAL_BASED_OUTPATIENT_CLINIC_OR_DEPARTMENT_OTHER)
Admission: EM | Admit: 2023-07-08 | Discharge: 2023-07-13 | DRG: 305 | Disposition: A | Discharge status: Home / Self Care | Payer: 59 | Attending: Internal Medicine | Admitting: Internal Medicine

## 2023-07-08 ENCOUNTER — Encounter (HOSPITAL_BASED_OUTPATIENT_CLINIC_OR_DEPARTMENT_OTHER): Payer: Self-pay | Admitting: Emergency Medicine

## 2023-07-08 DIAGNOSIS — E785 Hyperlipidemia, unspecified: Secondary | ICD-10-CM | POA: Diagnosis present

## 2023-07-08 DIAGNOSIS — Z6841 Body Mass Index (BMI) 40.0 and over, adult: Secondary | ICD-10-CM

## 2023-07-08 DIAGNOSIS — E119 Type 2 diabetes mellitus without complications: Secondary | ICD-10-CM

## 2023-07-08 DIAGNOSIS — I160 Hypertensive urgency: Principal | ICD-10-CM | POA: Diagnosis present

## 2023-07-08 DIAGNOSIS — I129 Hypertensive chronic kidney disease with stage 1 through stage 4 chronic kidney disease, or unspecified chronic kidney disease: Secondary | ICD-10-CM | POA: Diagnosis present

## 2023-07-08 DIAGNOSIS — N179 Acute kidney failure, unspecified: Secondary | ICD-10-CM | POA: Diagnosis present

## 2023-07-08 DIAGNOSIS — R062 Wheezing: Secondary | ICD-10-CM

## 2023-07-08 DIAGNOSIS — Z888 Allergy status to other drugs, medicaments and biological substances status: Secondary | ICD-10-CM

## 2023-07-08 DIAGNOSIS — Z7985 Long-term (current) use of injectable non-insulin antidiabetic drugs: Secondary | ICD-10-CM

## 2023-07-08 DIAGNOSIS — Z833 Family history of diabetes mellitus: Secondary | ICD-10-CM

## 2023-07-08 DIAGNOSIS — R600 Localized edema: Secondary | ICD-10-CM | POA: Diagnosis present

## 2023-07-08 DIAGNOSIS — Z8249 Family history of ischemic heart disease and other diseases of the circulatory system: Secondary | ICD-10-CM

## 2023-07-08 DIAGNOSIS — J449 Chronic obstructive pulmonary disease, unspecified: Secondary | ICD-10-CM | POA: Diagnosis present

## 2023-07-08 DIAGNOSIS — Z79899 Other long term (current) drug therapy: Secondary | ICD-10-CM

## 2023-07-08 DIAGNOSIS — I169 Hypertensive crisis, unspecified: Secondary | ICD-10-CM | POA: Diagnosis present

## 2023-07-08 DIAGNOSIS — N1832 Chronic kidney disease, stage 3b: Secondary | ICD-10-CM | POA: Diagnosis present

## 2023-07-08 DIAGNOSIS — E876 Hypokalemia: Secondary | ICD-10-CM | POA: Diagnosis present

## 2023-07-08 DIAGNOSIS — E1122 Type 2 diabetes mellitus with diabetic chronic kidney disease: Secondary | ICD-10-CM | POA: Diagnosis present

## 2023-07-08 DIAGNOSIS — Z7984 Long term (current) use of oral hypoglycemic drugs: Secondary | ICD-10-CM

## 2023-07-08 DIAGNOSIS — Z1152 Encounter for screening for COVID-19: Secondary | ICD-10-CM

## 2023-07-08 DIAGNOSIS — R0602 Shortness of breath: Secondary | ICD-10-CM | POA: Diagnosis present

## 2023-07-08 DIAGNOSIS — Z880 Allergy status to penicillin: Secondary | ICD-10-CM

## 2023-07-08 LAB — BASIC METABOLIC PANEL
Anion gap: 8 (ref 5–15)
BUN: 22 mg/dL — ABNORMAL HIGH (ref 6–20)
CO2: 28 mmol/L (ref 22–32)
Calcium: 9.5 mg/dL (ref 8.9–10.3)
Chloride: 105 mmol/L (ref 98–111)
Creatinine, Ser: 1.47 mg/dL — ABNORMAL HIGH (ref 0.44–1.00)
GFR, Estimated: 41 mL/min — ABNORMAL LOW (ref >60)
Glucose, Bld: 178 mg/dL — ABNORMAL HIGH (ref 70–99)
Potassium: 3.4 mmol/L — ABNORMAL LOW (ref 3.5–5.1)
Sodium: 141 mmol/L (ref 135–145)

## 2023-07-08 LAB — BRAIN NATRIURETIC PEPTIDE: B Natriuretic Peptide: 60.9 pg/mL (ref 0.0–100.0)

## 2023-07-08 LAB — CBC WITH DIFFERENTIAL/PLATELET
Abs Immature Granulocytes: 0.01 10*3/uL (ref 0.00–0.07)
Basophils Absolute: 0 10*3/uL (ref 0.0–0.1)
Basophils Relative: 0 %
Eosinophils Absolute: 0.2 10*3/uL (ref 0.0–0.5)
Eosinophils Relative: 3 %
HCT: 39.1 % (ref 36.0–46.0)
Hemoglobin: 12.7 g/dL (ref 12.0–15.0)
Immature Granulocytes: 0 %
Lymphocytes Relative: 28 %
Lymphs Abs: 1.9 10*3/uL (ref 0.7–4.0)
MCH: 29.1 pg (ref 26.0–34.0)
MCHC: 32.5 g/dL (ref 30.0–36.0)
MCV: 89.7 fL (ref 80.0–100.0)
Monocytes Absolute: 0.4 10*3/uL (ref 0.1–1.0)
Monocytes Relative: 6 %
Neutro Abs: 4.3 10*3/uL (ref 1.7–7.7)
Neutrophils Relative %: 63 %
Platelets: 167 10*3/uL (ref 150–400)
RBC: 4.36 MIL/uL (ref 3.87–5.11)
RDW: 12.2 % (ref 11.5–15.5)
WBC Morphology: ABNORMAL
WBC: 6.8 10*3/uL (ref 4.0–10.5)
nRBC: 0 % (ref 0.0–0.2)

## 2023-07-08 LAB — URINALYSIS, ROUTINE W REFLEX MICROSCOPIC
Bacteria, UA: NONE SEEN
Bilirubin Urine: NEGATIVE
Glucose, UA: NEGATIVE mg/dL
Ketones, ur: NEGATIVE mg/dL
Leukocytes,Ua: NEGATIVE
Nitrite: NEGATIVE
Protein, ur: >300 mg/dL — AB
Specific Gravity, Urine: 1.024 (ref 1.005–1.030)
pH: 6 (ref 5.0–8.0)

## 2023-07-08 LAB — RESP PANEL BY RT-PCR (RSV, FLU A&B, COVID)  RVPGX2
Influenza A by PCR: NEGATIVE
Influenza B by PCR: NEGATIVE
Resp Syncytial Virus by PCR: NEGATIVE
SARS Coronavirus 2 by RT PCR: NEGATIVE

## 2023-07-08 LAB — CBG MONITORING, ED: Glucose-Capillary: 129 mg/dL — ABNORMAL HIGH (ref 70–99)

## 2023-07-08 LAB — TROPONIN I (HIGH SENSITIVITY)
Troponin I (High Sensitivity): 36 ng/L — ABNORMAL HIGH (ref <18)
Troponin I (High Sensitivity): 37 ng/L — ABNORMAL HIGH (ref <18)

## 2023-07-08 MED ORDER — HYDROCHLOROTHIAZIDE 25 MG PO TABS
25.0000 mg | ORAL_TABLET | Freq: Once | ORAL | Status: AC
  Administered 2023-07-08: 25 mg via ORAL
  Filled 2023-07-08: qty 1

## 2023-07-08 MED ORDER — HYDRALAZINE HCL 20 MG/ML IJ SOLN
10.0000 mg | Freq: Once | INTRAMUSCULAR | Status: AC
  Administered 2023-07-08: 10 mg via INTRAVENOUS
  Filled 2023-07-08: qty 1

## 2023-07-08 MED ORDER — HYDRALAZINE HCL 20 MG/ML IJ SOLN
20.0000 mg | Freq: Once | INTRAMUSCULAR | Status: AC
Start: 2023-07-09 — End: 2023-07-08
  Administered 2023-07-08: 20 mg via INTRAVENOUS
  Filled 2023-07-08: qty 1

## 2023-07-08 MED ORDER — ALBUTEROL SULFATE (2.5 MG/3ML) 0.083% IN NEBU
5.0000 mg | INHALATION_SOLUTION | Freq: Once | RESPIRATORY_TRACT | Status: AC
  Administered 2023-07-08: 5 mg via RESPIRATORY_TRACT
  Filled 2023-07-08: qty 6

## 2023-07-08 MED ORDER — DEXAMETHASONE SODIUM PHOSPHATE 10 MG/ML IJ SOLN
10.0000 mg | Freq: Once | INTRAMUSCULAR | Status: AC
  Administered 2023-07-08: 10 mg via INTRAVENOUS
  Filled 2023-07-08: qty 1

## 2023-07-08 MED ORDER — IRBESARTAN 300 MG PO TABS
300.0000 mg | ORAL_TABLET | Freq: Every day | ORAL | Status: DC
  Administered 2023-07-08 – 2023-07-13 (×6): 300 mg via ORAL
  Filled 2023-07-08: qty 1
  Filled 2023-07-08: qty 2
  Filled 2023-07-08 (×3): qty 1
  Filled 2023-07-08: qty 2

## 2023-07-08 MED ORDER — AMLODIPINE BESYLATE 5 MG PO TABS
10.0000 mg | ORAL_TABLET | Freq: Once | ORAL | Status: AC
  Administered 2023-07-08: 10 mg via ORAL
  Filled 2023-07-08: qty 2

## 2023-07-08 MED ORDER — LABETALOL HCL 5 MG/ML IV SOLN
10.0000 mg | Freq: Once | INTRAVENOUS | Status: AC
  Administered 2023-07-08: 10 mg via INTRAVENOUS
  Filled 2023-07-08: qty 4

## 2023-07-08 MED ORDER — IPRATROPIUM-ALBUTEROL 0.5-2.5 (3) MG/3ML IN SOLN
3.0000 mL | Freq: Once | RESPIRATORY_TRACT | Status: AC
  Administered 2023-07-08: 3 mL via RESPIRATORY_TRACT
  Filled 2023-07-08: qty 3

## 2023-07-08 MED ORDER — MAGNESIUM SULFATE 2 GM/50ML IV SOLN
2.0000 g | Freq: Once | INTRAVENOUS | Status: AC
  Administered 2023-07-08: 2 g via INTRAVENOUS
  Filled 2023-07-08: qty 50

## 2023-07-08 NOTE — ED Notes (Signed)
PA putting in diet order. Pt given frozen dinner and drink.

## 2023-07-08 NOTE — ED Notes (Signed)
 Report given to the next RN.Marland KitchenMarland Kitchen

## 2023-07-08 NOTE — ED Provider Notes (Signed)
 [place] EMERGENCY DEPARTMENT AT [facility] Provider Note   CSN: [id number] Arrival date & time: 07/08/23  1248     History  No chief complaint on file.   Jenna Stokes is a 58 y.o. female past medical history significant for pedal edema, diabetes presents today for flulike symptoms x 3 months.  Patient was seen at her primary care office and had negative respiratory swab and chest x-ray and started on azithromycin.  Patient states she felt worse after treatment.  Patient was seen in urgent care today and sent here due to elevated blood pressure and concern for CHF.  Patient endorses congestion, intermittently productive cough, orthopnea, wheezing and fatigue.  Patient previously had fever, chills, and nausea but that resolved approximately 2 weeks ago.  Patient denies chest pain, shortness of breath, edema, abdominal pain, or diarrhea.  HPI     Home Medications Prior to Admission medications   Medication Sig Start Date End Date Taking? Authorizing Provider  AIRSUPRA 90-80 MCG/ACT AERO Inhale 2 puffs into the lungs 4 (four) times daily. 04/11/23  Yes [provider]  azithromycin (ZITHROMAX) 250 MG tablet Take 250 mg by mouth as directed. 06/30/23  Yes [provider]  montelukast (SINGULAIR) 10 MG tablet Take 10 mg by mouth daily. 06/30/23  Yes [provider]  Olmesartan -amLODIPine -HCTZ 40-10-25 MG TABS Take 1 tablet by mouth daily. 09/07/21  Yes Koberlein, Junell C, MD  spironolactone  (ALDACTONE ) 100 MG tablet Take 1 tablet (100 mg total) by mouth daily. 09/07/21  Yes Koberlein, Junell C, MD  VITAMIN D PO Take 1 tablet by mouth daily. *pt doesn't know units*   Yes [provider]  blood glucose meter kit and supplies KIT Dispense based on patient and insurance preference. Use up to four times daily as directed. (FOR ICD-9 250.00, 250.01). 06/16/19   Koberlein, Junell C, MD  glimepiride  (AMARYL ) 1 MG tablet Take 1 tablet (1 mg total) by mouth 2  (two) times daily. Patient not taking: Reported on 07/08/2023 09/07/21   Koberlein, Junell C, MD  linaclotide  (LINZESS ) 72 MCG capsule Take 72 mcg by mouth daily before breakfast.    [provider]  rosuvastatin  (CRESTOR ) 5 MG tablet Take 1 tablet (5 mg total) by mouth daily. Patient not taking: Reported on 07/08/2023 09/04/21   Koberlein, Junell C, MD  tirzepatide  (MOUNJARO ) 2.5 MG/0.5ML Pen Inject 2.5 mg into the skin once a week. Patient not taking: Reported on 07/08/2023 05/02/21   Koberlein, Junell C, MD  tirzepatide  (MOUNJARO ) 7.5 MG/0.5ML Pen Inject 7.5 mg into the skin once a week. 09/26/22         Allergies    Penicillins, Dapagliflozin  pro-metformin  er, and Metformin  hcl    Review of Systems   Review of Systems  Constitutional:  Positive for fatigue.  HENT:  Positive for congestion.   Respiratory:  Positive for cough and wheezing.     Physical Exam Updated Vital Signs BP (!) 212/109   Pulse 74   Temp 98.3 F (36.8 C) (Oral)   Resp 16   LMP 08/10/2006   SpO2 97%  Physical Exam Vitals and nursing note reviewed.  Constitutional:      General: She is not in acute distress.    Appearance: Normal appearance. She is well-developed. She is obese. She is not toxic-appearing.  HENT:     Head: Normocephalic and atraumatic.     Right Ear: External ear normal.     Left Ear: External ear normal.  Nose: Congestion present.     Mouth/Throat:     Mouth: Mucous membranes are moist.     Pharynx: Oropharynx is clear.  Eyes:     Conjunctiva/sclera: Conjunctivae normal.  Cardiovascular:     Rate and Rhythm: Normal rate and regular rhythm.     Pulses: Normal pulses.     Heart sounds: Normal heart sounds. No murmur heard. Pulmonary:     Effort: Pulmonary effort is normal. No respiratory distress.     Breath sounds: Wheezing present.  Abdominal:     Palpations: Abdomen is soft.     Tenderness: There is no abdominal tenderness.  Musculoskeletal:        General: No swelling.      Cervical back: Normal range of motion and neck supple.     Comments: Patient has bilateral lower extremity edema without pitting.  Skin:    General: Skin is warm and dry.     Capillary Refill: Capillary refill takes less than 2 seconds.  Neurological:     General: No focal deficit present.     Mental Status: She is alert.     Motor: No weakness.  Psychiatric:        Mood and Affect: Mood normal.     ED Results / Procedures / Treatments   Labs (all labs ordered are listed, but only abnormal results are displayed) Labs Reviewed  BASIC METABOLIC PANEL - Abnormal; Notable for the following components:      Result Value   Potassium 3.4 (*)    Glucose, Bld 178 (*)    BUN 22 (*)    Creatinine, Ser 1.47 (*)    GFR, Estimated 41 (*)    All other components within normal limits  URINALYSIS, ROUTINE W REFLEX MICROSCOPIC - Abnormal; Notable for the following components:   APPearance HAZY (*)    Hgb urine dipstick SMALL (*)    Protein, ur >300 (*)    All other components within normal limits  CBG MONITORING, ED - Abnormal; Notable for the following components:   Glucose-Capillary 129 (*)    All other components within normal limits  TROPONIN I (HIGH SENSITIVITY) - Abnormal; Notable for the following components:   Troponin I (High Sensitivity) 36 (*)    All other components within normal limits  TROPONIN I (HIGH SENSITIVITY) - Abnormal; Notable for the following components:   Troponin I (High Sensitivity) 37 (*)    All other components within normal limits  RESP PANEL BY RT-PCR (RSV, FLU A&B, COVID)  RVPGX2  CBC WITH DIFFERENTIAL/PLATELET  BRAIN NATRIURETIC PEPTIDE  PATHOLOGIST SMEAR REVIEW    EKG EKG Interpretation Date/Time:  Tuesday July 08 2023 13:54:14 EST Ventricular Rate:  61 PR Interval:  158 QRS Duration:  99 QT Interval:  448 QTC Calculation: 452 R Axis:   3  Text Interpretation: Sinus rhythm Left ventricular hypertrophy Anterior infarct, old Abnormal T,  consider ischemia, lateral leads No significant change since last tracing Confirmed by Jerrol Agent ([id number]) on 07/08/2023 5:29:52 PM  Radiology DG Chest Portable 1 View Result Date: 07/08/2023 CLINICAL DATA:  Shortness of breath. Flu-like symptoms for the past 3 months. EXAM: PORTABLE CHEST 1 VIEW COMPARISON:  Chest x-ray dated November 28, 2023. FINDINGS: Unchanged mild cardiomegaly. Normal pulmonary vascularity. No focal consolidation, pleural effusion, or pneumothorax. No acute osseous abnormality. IMPRESSION: 1. No active disease. Electronically Signed   By: Elsie ONEIDA Shoulder M.D.   On: 07/08/2023 15:19    Procedures Procedures    Medications Ordered in  ED Medications  irbesartan  (AVAPRO ) tablet 300 mg (has no administration in time range)  amLODipine  (NORVASC ) tablet 10 mg (has no administration in time range)  hydrochlorothiazide  (HYDRODIURIL ) tablet 25 mg (has no administration in time range)  albuterol  (PROVENTIL ) (2.5 MG/3ML) 0.083% nebulizer solution 5 mg (5 mg Nebulization Given 07/08/23 1346)  ipratropium-albuterol  (DUONEB) 0.5-2.5 (3) MG/3ML nebulizer solution 3 mL (3 mLs Nebulization Given 07/08/23 1446)  labetalol  (NORMODYNE ) injection 10 mg (10 mg Intravenous Given 07/08/23 1705)  hydrALAZINE  (APRESOLINE ) injection 10 mg (10 mg Intravenous Given 07/08/23 1735)  magnesium  sulfate IVPB 2 g 50 mL (2 g Intravenous New Bag/Given 07/08/23 1739)  dexamethasone  (DECADRON ) injection 10 mg (10 mg Intravenous Given 07/08/23 1737)    ED Course/ Medical Decision Making/ A&P                                 Medical Decision Making Amount and/or Complexity of Data Reviewed Labs: ordered. Radiology: ordered.  Risk Prescription drug management.   This patient presents to the ED with chief complaint(s) of elevated blood pressure with pertinent past medical history of hypertension, diabetes, AKI which further complicates the presenting complaint. The complaint involves an extensive differential diagnosis  and also carries with it a high risk of complications and morbidity.    The differential diagnosis includes hypertensive emergency, hypertension, CHF, ACS  Additional history obtained: Records reviewed Care Everywhere/External Records  ED Course and Reassessment: Patient started on hydralazine  Patient given magnesium  and Decadron  for wheezing  Independent labs interpretation:  The following labs were independently interpreted:  CBC: No notable findings BMP: Mild hypokalemia, mildly elevated bun, elevated creatinine at 1.47 which is her historical baseline BNP: 60.9 Troponin: 36, 37 patient has history of elevated troponins but not usually this elevated Respiratory panel: Negative EKG: Sinus rhythm, LVH UA: Small hemoglobin, greater than 300 protein  Independent visualization of imaging: - I independently visualized the following imaging with scope of interpretation limited to determining acute life threatening conditions related to emergency care: Chest x-ray, which revealed no active disease.  Consultation: - Consulted or discussed management/test interpretation w/ external professional: Hospitalist, Dr. Noralee to admit the patient for hypertensive urgency  Consideration for admission or further workup: Admission for hypertensive urgency        Final Clinical Impression(s) / ED Diagnoses Final diagnoses:  Hypertensive urgency  Wheezing    Rx / DC Orders ED Discharge Orders     None         Francis Ileana SAILOR, PA-C 07/08/23 1808    Pamella Ozell LABOR, DO 07/12/23 [id number]

## 2023-07-08 NOTE — ED Triage Notes (Signed)
Reports flu like symptoms x 3 months.  Seen at pcp neg swab, cxr, started on steroid. Worse after treatment. Today seen at [facility] sent for hypertension urgency and chf work up

## 2023-07-08 NOTE — ED Notes (Signed)
Pt back on monitor, up in chair

## 2023-07-09 ENCOUNTER — Observation Stay (HOSPITAL_COMMUNITY): Payer: 59

## 2023-07-09 ENCOUNTER — Observation Stay (HOSPITAL_BASED_OUTPATIENT_CLINIC_OR_DEPARTMENT_OTHER): Payer: 59

## 2023-07-09 ENCOUNTER — Encounter (HOSPITAL_COMMUNITY): Payer: Self-pay | Admitting: Internal Medicine

## 2023-07-09 DIAGNOSIS — I129 Hypertensive chronic kidney disease with stage 1 through stage 4 chronic kidney disease, or unspecified chronic kidney disease: Secondary | ICD-10-CM | POA: Diagnosis not present

## 2023-07-09 DIAGNOSIS — I5021 Acute systolic (congestive) heart failure: Secondary | ICD-10-CM | POA: Diagnosis not present

## 2023-07-09 DIAGNOSIS — Z6841 Body Mass Index (BMI) 40.0 and over, adult: Secondary | ICD-10-CM | POA: Diagnosis not present

## 2023-07-09 DIAGNOSIS — E876 Hypokalemia: Secondary | ICD-10-CM | POA: Diagnosis not present

## 2023-07-09 DIAGNOSIS — Z7984 Long term (current) use of oral hypoglycemic drugs: Secondary | ICD-10-CM | POA: Diagnosis not present

## 2023-07-09 DIAGNOSIS — E785 Hyperlipidemia, unspecified: Secondary | ICD-10-CM | POA: Diagnosis not present

## 2023-07-09 DIAGNOSIS — R609 Edema, unspecified: Secondary | ICD-10-CM

## 2023-07-09 DIAGNOSIS — E1122 Type 2 diabetes mellitus with diabetic chronic kidney disease: Secondary | ICD-10-CM | POA: Diagnosis not present

## 2023-07-09 DIAGNOSIS — Z7985 Long-term (current) use of injectable non-insulin antidiabetic drugs: Secondary | ICD-10-CM | POA: Diagnosis not present

## 2023-07-09 DIAGNOSIS — Z1152 Encounter for screening for COVID-19: Secondary | ICD-10-CM | POA: Diagnosis not present

## 2023-07-09 DIAGNOSIS — R062 Wheezing: Secondary | ICD-10-CM | POA: Diagnosis present

## 2023-07-09 DIAGNOSIS — Z79899 Other long term (current) drug therapy: Secondary | ICD-10-CM | POA: Diagnosis not present

## 2023-07-09 DIAGNOSIS — R0602 Shortness of breath: Secondary | ICD-10-CM | POA: Diagnosis present

## 2023-07-09 DIAGNOSIS — N1832 Chronic kidney disease, stage 3b: Secondary | ICD-10-CM | POA: Diagnosis not present

## 2023-07-09 DIAGNOSIS — I160 Hypertensive urgency: Secondary | ICD-10-CM | POA: Diagnosis not present

## 2023-07-09 DIAGNOSIS — Z833 Family history of diabetes mellitus: Secondary | ICD-10-CM | POA: Diagnosis not present

## 2023-07-09 DIAGNOSIS — Z888 Allergy status to other drugs, medicaments and biological substances status: Secondary | ICD-10-CM | POA: Diagnosis not present

## 2023-07-09 DIAGNOSIS — N179 Acute kidney failure, unspecified: Secondary | ICD-10-CM | POA: Diagnosis not present

## 2023-07-09 DIAGNOSIS — Z8249 Family history of ischemic heart disease and other diseases of the circulatory system: Secondary | ICD-10-CM | POA: Diagnosis not present

## 2023-07-09 DIAGNOSIS — Z880 Allergy status to penicillin: Secondary | ICD-10-CM | POA: Diagnosis not present

## 2023-07-09 DIAGNOSIS — J449 Chronic obstructive pulmonary disease, unspecified: Secondary | ICD-10-CM | POA: Diagnosis not present

## 2023-07-09 LAB — ECHOCARDIOGRAM COMPLETE
AR max vel: 2.86 cm2
AV Area VTI: 2.98 cm2
AV Area mean vel: 2.75 cm2
AV Mean grad: 8 mm[Hg]
AV Peak grad: 11.7 mm[Hg]
Ao pk vel: 1.71 m/s
Area-P 1/2: 2.64 cm2
Height: 69 in
S' Lateral: 3.2 cm
Weight: 5414.5 [oz_av]

## 2023-07-09 LAB — LIPID PANEL
Cholesterol: 222 mg/dL — ABNORMAL HIGH (ref 0–200)
HDL: 44 mg/dL (ref >40)
LDL Cholesterol: 143 mg/dL — ABNORMAL HIGH (ref 0–99)
Total CHOL/HDL Ratio: 5 {ratio}
Triglycerides: 175 mg/dL — ABNORMAL HIGH (ref <150)
VLDL: 35 mg/dL (ref 0–40)

## 2023-07-09 LAB — GLUCOSE, CAPILLARY
Glucose-Capillary: 206 mg/dL — ABNORMAL HIGH (ref 70–99)
Glucose-Capillary: 168 mg/dL — ABNORMAL HIGH (ref 70–99)

## 2023-07-09 LAB — MAGNESIUM: Magnesium: 2.3 mg/dL (ref 1.7–2.4)

## 2023-07-09 LAB — HEPATIC FUNCTION PANEL
ALT: 17 U/L (ref 0–44)
AST: 22 U/L (ref 15–41)
Albumin: 3.2 g/dL — ABNORMAL LOW (ref 3.5–5.0)
Alkaline Phosphatase: 65 U/L (ref 38–126)
Bilirubin, Direct: <0.1 mg/dL (ref 0.0–0.2)
Total Bilirubin: 0.5 mg/dL (ref 0.0–1.2)
Total Protein: 6.8 g/dL (ref 6.5–8.1)

## 2023-07-09 LAB — T4, FREE: Free T4: 0.94 ng/dL (ref 0.61–1.12)

## 2023-07-09 LAB — PATHOLOGIST SMEAR REVIEW

## 2023-07-09 LAB — HEMOGLOBIN A1C
Hgb A1c MFr Bld: 7 % — ABNORMAL HIGH (ref 4.8–5.6)
Mean Plasma Glucose: 154.2 mg/dL

## 2023-07-09 LAB — TSH: TSH: 0.527 u[IU]/mL (ref 0.350–4.500)

## 2023-07-09 MED ORDER — LABETALOL HCL 5 MG/ML IV SOLN
20.0000 mg | Freq: Once | INTRAVENOUS | Status: AC
  Administered 2023-07-09: 20 mg via INTRAVENOUS
  Filled 2023-07-09: qty 4

## 2023-07-09 MED ORDER — FUROSEMIDE 10 MG/ML IJ SOLN
20.0000 mg | Freq: Two times a day (BID) | INTRAMUSCULAR | Status: AC
Start: 2023-07-09 — End: 2023-07-10
  Administered 2023-07-09 – 2023-07-10 (×2): 20 mg via INTRAVENOUS
  Filled 2023-07-09 (×2): qty 2

## 2023-07-09 MED ORDER — ALBUTEROL SULFATE (2.5 MG/3ML) 0.083% IN NEBU
2.5000 mg | INHALATION_SOLUTION | Freq: Four times a day (QID) | RESPIRATORY_TRACT | Status: DC | PRN
  Administered 2023-07-09 – 2023-07-11 (×2): 2.5 mg via RESPIRATORY_TRACT
  Filled 2023-07-09 (×3): qty 3

## 2023-07-09 MED ORDER — HYDRALAZINE HCL 20 MG/ML IJ SOLN
20.0000 mg | Freq: Four times a day (QID) | INTRAMUSCULAR | Status: DC | PRN
  Administered 2023-07-09 – 2023-07-12 (×5): 20 mg via INTRAVENOUS
  Filled 2023-07-09 (×5): qty 1

## 2023-07-09 MED ORDER — INSULIN ASPART 100 UNIT/ML IJ SOLN
0.0000 [IU] | Freq: Three times a day (TID) | INTRAMUSCULAR | Status: DC
  Administered 2023-07-11: 2 [IU] via SUBCUTANEOUS
  Administered 2023-07-12 (×2): 1 [IU] via SUBCUTANEOUS
  Administered 2023-07-12: 2 [IU] via SUBCUTANEOUS
  Administered 2023-07-13: 1 [IU] via SUBCUTANEOUS

## 2023-07-09 MED ORDER — HYDRALAZINE HCL 20 MG/ML IJ SOLN
10.0000 mg | INTRAMUSCULAR | Status: DC | PRN
  Administered 2023-07-09: 10 mg via INTRAVENOUS
  Filled 2023-07-09: qty 1

## 2023-07-09 MED ORDER — HYDRALAZINE HCL 20 MG/ML IJ SOLN
10.0000 mg | Freq: Once | INTRAMUSCULAR | Status: AC
  Administered 2023-07-09: 10 mg via INTRAVENOUS
  Filled 2023-07-09: qty 1

## 2023-07-09 MED ORDER — BUDESONIDE 0.25 MG/2ML IN SUSP
0.2500 mg | Freq: Two times a day (BID) | RESPIRATORY_TRACT | Status: DC
  Administered 2023-07-09 – 2023-07-13 (×7): 0.25 mg via RESPIRATORY_TRACT
  Filled 2023-07-09 (×7): qty 2

## 2023-07-09 NOTE — Assessment & Plan Note (Signed)
Suspect CHF or PE or IPF.  We will start with CT chest noncontrast.  Venous dopplers. PFT as needed. OSA eval.  Cardiology called and consulted along with  2d echo.

## 2023-07-09 NOTE — ED Notes (Signed)
 Charleston Conrad at [facility] for transport: 09:57

## 2023-07-09 NOTE — Progress Notes (Signed)
 Patient to [facility]. Alert and oriented. No pain. Vital signs obtained. On monitor CCMD notified. CHG completed. MD notified of arrival.   07/09/23 1114  Vitals  Temp (!) 97.5 F (36.4 C)  Temp Source Oral  BP (!) 209/87  MAP (mmHg) 121  BP Location Left Wrist  BP Method Automatic  Patient Position (if appropriate) Lying  Pulse Rate 63  Pulse Rate Source Monitor  ECG Heart Rate 62  Resp 16  Level of Consciousness  Level of Consciousness Alert  Oxygen Therapy  SpO2 96 %  O2 Device Room Air

## 2023-07-09 NOTE — H&P (Signed)
 History and Physical    Patient: Jenna Stokes FMW:[id number] DOB: 10/06/1965 DOA: 07/08/2023 DOS: the patient was seen and examined on 07/09/2023 PCP: Patient, No Pcp Per  Patient coming from:  [facility] Chief complaint: Chief Complaint  Patient presents with   Hypertension   HPI:  Jenna Stokes is a 58 y.o. female with past medical history  of  morbid obesity with BMI of 49.97, Essential HTN, DM II, and CKD coming with SOB since November 2024 and now SOBOE for past few days. Pt denies any  le edema or orthopnea. Pt states she is looking for PCP and has never seen cardiology. No other complaints and is stable otherwise on RA. BP has remained high.   >>ED Course:direct admit.  At bedside nontoxic.  Vitals:   07/09/23 1237 07/09/23 1334 07/09/23 1648 07/09/23 1724  BP: (!) 188/80 (!) 154/56 (!) 194/72 (!) 174/65  Pulse:  71    Temp:      Resp:  18    Height:      Weight:      SpO2:  100%    TempSrc:      BMI (Calculated):      ED evaluation  so far shows: BMP showing potassium of 3.4 / AKI of 1.47 egfr of 41/ glucose of 178.  Cbc shows normal counts. Resp panel is negative.  Urine shows protein/ no glucose.  BNP of  60.9/ TNI 36 and repeat at 37.   In the emergency room  pt has received the following treatment thus far: Medications  irbesartan  (AVAPRO ) tablet 300 mg (300 mg Oral Given 07/09/23 1031)  albuterol  (PROVENTIL ) (2.5 MG/3ML) 0.083% nebulizer solution 2.5 mg (2.5 mg Nebulization Given 07/09/23 0111)  insulin  aspart (novoLOG ) injection 0-9 Units ( Subcutaneous Not Given 07/09/23 1645)  hydrALAZINE  (APRESOLINE ) injection 20 mg (20 mg Intravenous Given 07/09/23 1717)  budesonide  (PULMICORT ) nebulizer solution 0.25 mg (has no administration in time range)  albuterol  (PROVENTIL ) (2.5 MG/3ML) 0.083% nebulizer solution 5 mg (5 mg Nebulization Given 07/08/23 1346)  ipratropium-albuterol  (DUONEB) 0.5-2.5 (3) MG/3ML nebulizer solution 3 mL (3 mLs Nebulization Given 07/08/23 1446)  labetalol   (NORMODYNE ) injection 10 mg (10 mg Intravenous Given 07/08/23 1705)  hydrALAZINE  (APRESOLINE ) injection 10 mg (10 mg Intravenous Given 07/08/23 1735)  magnesium  sulfate IVPB 2 g 50 mL (0 g Intravenous Stopped 07/08/23 1829)  dexamethasone  (DECADRON ) injection 10 mg (10 mg Intravenous Given 07/08/23 1737)  amLODipine  (NORVASC ) tablet 10 mg (10 mg Oral Given 07/08/23 1844)  hydrochlorothiazide  (HYDRODIURIL ) tablet 25 mg (25 mg Oral Given 07/08/23 1844)  hydrALAZINE  (APRESOLINE ) injection 20 mg (20 mg Intravenous Given 07/08/23 2352)  labetalol  (NORMODYNE ) injection 20 mg (20 mg Intravenous Given 07/09/23 0420)  labetalol  (NORMODYNE ) injection 20 mg (20 mg Intravenous Given 07/09/23 0906)  hydrALAZINE  (APRESOLINE ) injection 10 mg (10 mg Intravenous Given 07/09/23 1252)  Review of Systems  Respiratory:  Positive for shortness of breath.   Cardiovascular:  Positive for leg swelling.  All other systems reviewed and are negative.  Past Medical History:  Diagnosis Date   AKI (acute kidney injury) (HCC) 12/16/2020   DM type 2, uncontrolled, with renal complications 09/20/2013   Hyperlipidemia    Hypertension    Morbid obesity (HCC)    Past Surgical History:  Procedure Laterality Date   ABDOMINAL HYSTERECTOMY     CESAREAN SECTION     INCISION AND DRAINAGE ABSCESS Left 12/20/2020   Procedure: INCISION AND DRAINAGE ABSCESS;  Surgeon: Curvin Deward MOULD, MD;  Location: [facility];  Service: General;  Laterality: Left;   TONSILLECTOMY      reports that she has never smoked. She has never used smokeless tobacco. She reports that she does not drink alcohol and does not use drugs.  Allergies  Allergen Reactions   Penicillins Other (See Comments)    Unknown childhood reaction Has patient had a PCN reaction causing immediate rash, facial/tongue/throat swelling, SOB or lightheadedness with hypotension: Unknown Has patient had a PCN reaction causing severe rash involving mucus membranes or skin necrosis: Unknown Has patient  had a PCN reaction that required hospitalization: Unknown Has patient had a PCN reaction occurring within the last 10 years: No If all of the above answers are NO, then may proceed with Cephalosporin use.    Dapagliflozin  Pro-Metformin  Er Diarrhea and Other (See Comments)   Metformin  Hcl Diarrhea and Other (See Comments)    Family History  Problem Relation Age of Onset   Heart disease Mother    Diabetes Mother    Hypertension Sister    Diabetes Sister    Adrenal disorder Sister     Prior to Admission medications   Medication Sig Start Date End Date Taking? Authorizing Provider  AIRSUPRA 90-80 MCG/ACT AERO Inhale 2 puffs into the lungs 4 (four) times daily. 04/11/23  Yes [provider]  azithromycin (ZITHROMAX) 250 MG tablet Take 250 mg by mouth as directed. 06/30/23  Yes [provider]  montelukast (SINGULAIR) 10 MG tablet Take 10 mg by mouth daily. 06/30/23  Yes [provider]  Olmesartan -amLODIPine -HCTZ 40-10-25 MG TABS Take 1 tablet by mouth daily. 09/07/21  Yes Koberlein, Junell C, MD  spironolactone  (ALDACTONE ) 100 MG tablet Take 1 tablet (100 mg total) by mouth daily. 09/07/21  Yes Koberlein, Junell C, MD  VITAMIN D PO Take 1 tablet by mouth daily. *pt doesn't know units*   Yes [provider]  blood glucose meter kit and supplies KIT Dispense based on patient and insurance preference. Use up to four times daily as directed. (FOR ICD-9 250.00, 250.01). 06/16/19   Koberlein, Junell C, MD  linaclotide  (LINZESS ) 72 MCG capsule Take 72 mcg by mouth daily before breakfast.    [provider]  rosuvastatin  (CRESTOR ) 5 MG tablet Take 1 tablet (5 mg total) by mouth daily. Patient not taking: Reported on 07/08/2023 09/04/21   Koberlein, Junell C, MD  tirzepatide  (MOUNJARO ) 7.5 MG/0.5ML Pen Inject 7.5 mg into the skin once a week. 09/26/22      Vitals:   07/09/23 1237 07/09/23 1334 07/09/23 1648 07/09/23 1724  BP: (!) 188/80 (!) 154/56 (!) 194/72 (!)  174/65  Pulse:  71    Resp:  18    Temp:      TempSrc:      SpO2:  100%    Weight:      Height:       Physical Exam Vitals and nursing note reviewed.  Constitutional:      General: She is not in acute distress. HENT:     Head: Normocephalic and atraumatic.     Right Ear: Hearing normal.     Left Ear: Hearing normal.     Nose: Nose normal. No nasal deformity.     Mouth/Throat:     Lips: Pink.     Tongue: No lesions.     Pharynx: Oropharynx is clear.  Eyes:     General: Lids are normal.     Extraocular Movements: Extraocular movements intact.  Cardiovascular:     Rate and  Rhythm: Normal rate and regular rhythm.     Pulses:          Dorsalis pedis pulses are 2+ on the right side and 2+ on the left side.       Posterior tibial pulses are 2+ on the right side and 2+ on the left side.     Heart sounds: Normal heart sounds.  Pulmonary:     Effort: Pulmonary effort is normal.     Breath sounds: Rales present.  Abdominal:     General: Bowel sounds are normal. There is no distension.     Palpations: Abdomen is soft. There is no mass.     Tenderness: There is no abdominal tenderness.  Musculoskeletal:     Right lower leg: 1+ Pitting Edema present.     Left lower leg: 1+ Pitting Edema present.  Skin:    General: Skin is warm.  Neurological:     General: No focal deficit present.     Mental Status: She is alert and oriented to person, place, and time.     Cranial Nerves: Cranial nerves 2-12 are intact.  Psychiatric:        Attention and Perception: Attention normal.        Mood and Affect: Mood normal.        Speech: Speech normal.        Behavior: Behavior normal. Behavior is cooperative.    Labs on Admission: I have personally reviewed following labs and imaging studies Results for orders placed or performed during the hospital encounter of 07/08/23 (from the past 24 hours)  Hepatic function panel     Status: Abnormal   Collection Time: 07/09/23  2:02 PM  Result Value  Ref Range   Total Protein 6.8 6.5 - 8.1 g/dL   Albumin 3.2 (L) 3.5 - 5.0 g/dL   AST 22 15 - 41 U/L   ALT 17 0 - 44 U/L   Alkaline Phosphatase 65 38 - 126 U/L   Total Bilirubin 0.5 0.0 - 1.2 mg/dL   Bilirubin, Direct <9.8 0.0 - 0.2 mg/dL   Indirect Bilirubin NOT CALCULATED 0.3 - 0.9 mg/dL  Hemoglobin J8r     Status: Abnormal   Collection Time: 07/09/23  2:02 PM  Result Value Ref Range   Hgb A1c MFr Bld 7.0 (H) 4.8 - 5.6 %   Mean Plasma Glucose 154.2 mg/dL  T4, free     Status: None   Collection Time: 07/09/23  2:02 PM  Result Value Ref Range   Free T4 0.94 0.61 - 1.12 ng/dL  TSH     Status: None   Collection Time: 07/09/23  2:02 PM  Result Value Ref Range   TSH 0.527 0.350 - 4.500 uIU/mL  Lipid panel     Status: Abnormal   Collection Time: 07/09/23  2:02 PM  Result Value Ref Range   Cholesterol 222 (H) 0 - 200 mg/dL   Triglycerides 824 (H) <150 mg/dL   HDL 44 >59 mg/dL   Total CHOL/HDL Ratio 5.0 RATIO   VLDL 35 0 - 40 mg/dL   LDL Cholesterol 856 (H) 0 - 99 mg/dL  Glucose, capillary     Status: Abnormal   Collection Time: 07/09/23  3:53 PM  Result Value Ref Range   Glucose-Capillary 206 (H) 70 - 99 mg/dL   Comment 1 Notify RN    Comment 2 Document in Chart    Recent Results (from the past 720 hours)  Resp panel by RT-PCR (RSV, Flu  A&B, Covid) Anterior Nasal Swab     Status: None   Collection Time: 07/08/23  1:48 PM   Specimen: Anterior Nasal Swab  Result Value Ref Range Status   SARS Coronavirus 2 by RT PCR NEGATIVE NEGATIVE Final    Comment: (NOTE) SARS-CoV-2 target nucleic acids are NOT DETECTED.  The SARS-CoV-2 RNA is generally detectable in upper respiratory specimens during the acute phase of infection. The lowest concentration of SARS-CoV-2 viral copies this assay can detect is 138 copies/mL. A negative result does not preclude SARS-Cov-2 infection and should not be used as the sole basis for treatment or other patient management decisions. A negative result  may occur with  improper specimen collection/handling, submission of specimen other than nasopharyngeal swab, presence of viral mutation(s) within the areas targeted by this assay, and inadequate number of viral copies(<138 copies/mL). A negative result must be combined with clinical observations, patient history, and epidemiological information. The expected result is Negative.  Fact Sheet for Patients:  bloggercourse.com  Fact Sheet for Healthcare Providers:  seriousbroker.it  This test is no t yet approved or cleared by the [place]  FDA and  has been authorized for detection and/or diagnosis of SARS-CoV-2 by FDA under an Emergency Use Authorization (EUA). This EUA will remain  in effect (meaning this test can be used) for the duration of the COVID-19 declaration under Section 564(b)(1) of the Act, 21 U.S.C.section 360bbb-3(b)(1), unless the authorization is terminated  or revoked sooner.       Influenza A by PCR NEGATIVE NEGATIVE Final   Influenza B by PCR NEGATIVE NEGATIVE Final    Comment: (NOTE) The Xpert Xpress SARS-CoV-2/FLU/RSV plus assay is intended as an aid in the diagnosis of influenza from Nasopharyngeal swab specimens and should not be used as a sole basis for treatment. Nasal washings and aspirates are unacceptable for Xpert Xpress SARS-CoV-2/FLU/RSV testing.  Fact Sheet for Patients: bloggercourse.com  Fact Sheet for Healthcare Providers: seriousbroker.it  This test is not yet approved or cleared by the [place]  FDA and has been authorized for detection and/or diagnosis of SARS-CoV-2 by FDA under an Emergency Use Authorization (EUA). This EUA will remain in effect (meaning this test can be used) for the duration of the COVID-19 declaration under Section 564(b)(1) of the Act, 21 U.S.C. section 360bbb-3(b)(1), unless the authorization is terminated  or revoked.     Resp Syncytial Virus by PCR NEGATIVE NEGATIVE Final    Comment: (NOTE) Fact Sheet for Patients: bloggercourse.com  Fact Sheet for Healthcare Providers: seriousbroker.it  This test is not yet approved or cleared by the [place]  FDA and has been authorized for detection and/or diagnosis of SARS-CoV-2 by FDA under an Emergency Use Authorization (EUA). This EUA will remain in effect (meaning this test can be used) for the duration of the COVID-19 declaration under Section 564(b)(1) of the Act, 21 U.S.C. section 360bbb-3(b)(1), unless the authorization is terminated or revoked.  Performed at [facility], [address] [place], [place] [address]    CBC:    Latest Ref Rng & Units 07/08/2023    1:30 PM 08/31/2021   10:36 AM 01/23/2021   11:28 AM  CBC  WBC 4.0 - 10.5 K/uL 6.8  9.2  7.2   Hemoglobin 12.0 - 15.0 g/dL 87.2  87.7  87.7   Hematocrit 36.0 - 46.0 % 39.1  37.0  36.8   Platelets 150 - 400 K/uL 167  192.0  200.0    Basic Metabolic Panel: Recent Labs  Lab 07/08/23  1330  NA 141  K 3.4*  CL 105  CO2 28  GLUCOSE 178*  BUN 22*  CREATININE 1.47*  CALCIUM  9.5   Creatinine: Lab Results  Component Value Date   CREATININE 1.47 (H) 07/08/2023   CREATININE 1.63 (H) 08/31/2021   CREATININE 1.66 (H) 04/25/2021   Liver Function Tests:    Latest Ref Rng & Units 07/09/2023    2:02 PM 08/31/2021   10:36 AM 04/25/2021    2:58 PM  Hepatic Function  Total Protein 6.5 - 8.1 g/dL 6.8  7.4  7.7   Albumin 3.5 - 5.0 g/dL 3.2  4.5  4.7   AST 15 - 41 U/L 22  18  14    ALT 0 - 44 U/L 17  16  18    Alk Phosphatase 38 - 126 U/L 65  57  55   Total Bilirubin 0.0 - 1.2 mg/dL 0.5  0.4  0.4   Bilirubin, Direct 0.0 - 0.2 mg/dL <9.8      YaJ8R: Recent Labs    07/09/23 1402  HGBA1C 7.0*   Lipid Profile: Recent Labs    07/09/23 1402  CHOL 222*  HDL 44  LDLCALC 143*  TRIG 175*  CHOLHDL 5.0     Radiological Exams on Admission: ECHOCARDIOGRAM COMPLETE Result Date: 07/09/2023    ECHOCARDIOGRAM REPORT   Patient Name:   Jenna Stokes Date of Exam: 07/09/2023 Medical Rec #:  [id number]       Height:       69.0 in Accession #:    [id number]      Weight:       338.4 lb Date of Birth:  [birth year]       BSA:          2.583 m Patient Age:    57 years        BP:           188/80 mmHg Patient Gender: F               HR:           67 bpm. Exam Location:  Inpatient Procedure: 2D Echo, Cardiac Doppler and Color Doppler Indications:    CHF-Acute Systolic I50.21  History:        Patient has prior history of Echocardiogram examinations, most                 recent 01/19/2018. Risk Factors:Diabetes and Hypertension.  Sonographer:    Jayson Gaskins Referring Phys: [phone] Anatalia Kronk V Kylar Speelman IMPRESSIONS  1. Left ventricular ejection fraction, by estimation, is 70 to 75%. The left ventricle has hyperdynamic function. The left ventricle has no regional wall motion abnormalities. There is moderate concentric left ventricular hypertrophy. Left ventricular diastolic parameters are indeterminate.  2. Right ventricular systolic function is normal. The right ventricular size is normal.  3. Mild mitral valve regurgitation.  4. The aortic valve is tricuspid. Aortic valve regurgitation is not visualized. FINDINGS  Left Ventricle: Left ventricular ejection fraction, by estimation, is 70 to 75%. The left ventricle has hyperdynamic function. The left ventricle has no regional wall motion abnormalities. The left ventricular internal cavity size was normal in size. There is moderate concentric left ventricular hypertrophy. Left ventricular diastolic parameters are indeterminate. Right Ventricle: The right ventricular size is normal. Right vetricular wall thickness was not assessed. Right ventricular systolic function is normal. Left Atrium: Left atrial size was normal in size. Right Atrium: Right atrial size was normal in size. Pericardium: There  is no evidence of pericardial effusion. Mitral Valve: There is mild thickening of the mitral valve leaflet(s). Mild mitral valve regurgitation. Tricuspid Valve: The tricuspid valve is normal in structure. Tricuspid valve regurgitation is mild. Aortic Valve: The aortic valve is tricuspid. Aortic valve regurgitation is not visualized. Aortic valve mean gradient measures 8.0 mmHg. Aortic valve peak gradient measures 11.7 mmHg. Aortic valve area, by VTI measures 2.98 cm. Pulmonic Valve: The pulmonic valve was not well visualized. Pulmonic valve regurgitation is not visualized. No evidence of pulmonic stenosis. Aorta: The aortic root is normal in size and structure. IAS/Shunts: No atrial level shunt detected by color flow Doppler.  LEFT VENTRICLE PLAX 2D LVIDd:         5.30 cm   Diastology LVIDs:         3.20 cm   LV e' medial:    5.44 cm/s LV PW:         1.60 cm   LV E/e' medial:  18.3 LV IVS:        1.60 cm   LV e' lateral:   3.70 cm/s LVOT diam:     2.10 cm   LV E/e' lateral: 27.0 LV SV:         111 LV SV Index:   43 LVOT Area:     3.46 cm  RIGHT VENTRICLE RV S prime:     18.80 cm/s TAPSE (M-mode): 2.6 cm LEFT ATRIUM             Index        RIGHT ATRIUM           Index LA Vol (A2C):   84.6 ml 32.75 ml/m  RA Area:     16.60 cm LA Vol (A4C):   41.6 ml 16.10 ml/m  RA Volume:   39.90 ml  15.44 ml/m LA Biplane Vol: 60.3 ml 23.34 ml/m  AORTIC VALVE AV Area (Vmax):    2.86 cm AV Area (Vmean):   2.75 cm AV Area (VTI):     2.98 cm AV Vmax:           171.00 cm/s AV Vmean:          136.000 cm/s AV VTI:            0.373 m AV Peak Grad:      11.7 mmHg AV Mean Grad:      8.0 mmHg LVOT Vmax:         141.00 cm/s LVOT Vmean:        108.000 cm/s LVOT VTI:          0.321 m LVOT/AV VTI ratio: 0.86  AORTA Ao Root diam: 3.10 cm MITRAL VALVE MV Area (PHT): 2.64 cm    SHUNTS MV Decel Time: 287 msec    Systemic VTI:  0.32 m MV E velocity: 99.80 cm/s  Systemic Diam: 2.10 cm MV A velocity: 84.30 cm/s MV E/A ratio:  1.18 Vina Gull  MD Electronically signed by Vina Gull MD Signature Date/Time: 07/09/2023/4:26:54 PM    Final    VAS US  LOWER EXTREMITY VENOUS (DVT) Result Date: 07/09/2023  Lower Venous DVT Study Patient Name:  ZAMORAH AILES  Date of Exam:   07/09/2023 Medical Rec #: [id number]        Accession #:    [id number] Date of Birth: 01/21/1966        Patient Gender: F Patient Age:   4 years Exam Location:  [facility] Procedure:  VAS US  LOWER EXTREMITY VENOUS (DVT) Referring Phys: Derward Marple --------------------------------------------------------------------------------  Indications: Swelling, and SOB.  Risk Factors: Obesity. Limitations: Body habitus and poor ultrasound/tissue interface. Comparison Study: No recent priors. Performing Technologist: Ricka Holland RDMS, RVT  Examination Guidelines: A complete evaluation includes B-mode imaging, spectral Doppler, color Doppler, and power Doppler as needed of all accessible portions of each vessel. Bilateral testing is considered an integral part of a complete examination. Limited examinations for reoccurring indications may be performed as noted. The reflux portion of the exam is performed with the patient in reverse Trendelenburg.  +---------+---------------+---------+-----------+----------+--------------+ RIGHT    CompressibilityPhasicitySpontaneityPropertiesThrombus Aging +---------+---------------+---------+-----------+----------+--------------+ CFV      Full           Yes      Yes                                 +---------+---------------+---------+-----------+----------+--------------+ SFJ      Full                                                        +---------+---------------+---------+-----------+----------+--------------+ FV Prox  Full                                                        +---------+---------------+---------+-----------+----------+--------------+ FV Mid   Full                                                         +---------+---------------+---------+-----------+----------+--------------+ FV DistalFull                                                        +---------+---------------+---------+-----------+----------+--------------+ PFV      Full                                                        +---------+---------------+---------+-----------+----------+--------------+ POP      Full           Yes      Yes                                 +---------+---------------+---------+-----------+----------+--------------+ PTV      Full                                                        +---------+---------------+---------+-----------+----------+--------------+ PERO     Full                                                        +---------+---------------+---------+-----------+----------+--------------+   +---------+---------------+---------+-----------+----------+--------------+  LEFT     CompressibilityPhasicitySpontaneityPropertiesThrombus Aging +---------+---------------+---------+-----------+----------+--------------+ CFV      Full           Yes      Yes                                 +---------+---------------+---------+-----------+----------+--------------+ SFJ      Full                                                        +---------+---------------+---------+-----------+----------+--------------+ FV Prox  Full                                                        +---------+---------------+---------+-----------+----------+--------------+ FV Mid   Full                                                        +---------+---------------+---------+-----------+----------+--------------+ FV DistalFull                                                        +---------+---------------+---------+-----------+----------+--------------+ PFV      Full                                                         +---------+---------------+---------+-----------+----------+--------------+ POP      Full           Yes      Yes                                 +---------+---------------+---------+-----------+----------+--------------+ PTV      Full                                                        +---------+---------------+---------+-----------+----------+--------------+ PERO     Full                                                        +---------+---------------+---------+-----------+----------+--------------+    Summary: BILATERAL: - No evidence of deep vein thrombosis seen in the lower extremities, bilaterally. -No evidence of popliteal cyst, bilaterally.   *See table(s) above for measurements and observations.    Preliminary    DG Chest Portable 1 View Result Date:  07/08/2023 CLINICAL DATA:  Shortness of breath. Flu-like symptoms for the past 3 months. EXAM: PORTABLE CHEST 1 VIEW COMPARISON:  Chest x-ray dated November 28, 2023. FINDINGS: Unchanged mild cardiomegaly. Normal pulmonary vascularity. No focal consolidation, pleural effusion, or pneumothorax. No acute osseous abnormality. IMPRESSION: 1. No active disease. Electronically Signed   By: Elsie ONEIDA Shoulder M.D.   On: 07/08/2023 15:19   Data Reviewed: Relevant notes from primary care and specialist visits, past discharge summaries as available in EHR, including Care Everywhere. Prior diagnostic testing as pertinent to current admission diagnoses, Updated medications and problem lists for reconciliation ED course, including vitals, labs, imaging, treatment and response to treatment,Triage notes, nursing and pharmacy notes and ED provider's notes Notable results as noted in HPI.Discussed case with EDMD/ ED APP/ or Specialty MD on call and as needed.  >>Assessment and Plan: * SOBOE (shortness of breath on exertion) Suspect CHF or PE or IPF.  We will start with CT chest noncontrast.  Venous dopplers. PFT as needed. OSA eval.   Cardiology called and consulted along with  2d echo.   Pedal edema Bilateral venous doppler and will treat if positive.   Hypertensive urgency Vitals:   07/09/23 0430 07/09/23 0500 07/09/23 0615 07/09/23 0810  BP: (!) 192/84 (!) 201/93 (!) 208/93 (!) 219/80   07/09/23 0900 07/09/23 1038 07/09/23 1122 07/09/23 1124  BP: (!) 225/82 (!) 175/82 (!) 209/87 (!) 180/67   07/09/23 1229 07/09/23 1237 07/09/23 1334 07/09/23 1648  BP: (!) 199/85 (!) 188/80 (!) 154/56 (!) 194/72  Pt has taken her home meds: triple therapy with olmesartan  amlodipine  and hydrochlorothiazide .  Kidney function is stable.  PRN hydralazine .  Will monitor and consider clonidine and monitor HR as HR is below 70's.    DM (diabetes mellitus) (HCC) Glycemic protocol A1c of 7.    Morbid obesity (HCC) Ft4/tsh. Cardiac diet.  Nutrition consult prior to discharge.   CKD stage 3b, GFR 30-44 ml/min (HCC) Lab Results  Component Value Date   CREATININE 1.47 (H) 07/08/2023   CREATININE 1.63 (H) 08/31/2021   CREATININE 1.66 (H) 04/25/2021  Stable avoid contrast and renally dose meds.    Chronic obstructive pulmonary disease (HCC) Stable no wheezing on exam. Will start pt on PRN albuterol . Pulmicort  neb q 12 h x 1 day.   DVT prophylaxis:  Heparin.  Consults:  Cardiology; Cardiology master contacted.   Advance Care Planning:    Code Status: Full Code   Family Communication:  None.  Disposition Plan:  Home.  Severity of Illness: The appropriate patient status for this patient is INPATIENT. Inpatient status is judged to be reasonable and necessary in order to provide the required intensity of service to ensure the patient's safety. The patient's presenting symptoms, physical exam findings, and initial radiographic and laboratory data in the context of their chronic comorbidities is felt to place them at high risk for further clinical deterioration. Furthermore, it is not anticipated that the patient will be  medically stable for discharge from the hospital within 2 midnights of admission.   * I certify that at the point of admission it is my clinical judgment that the patient will require inpatient hospital care spanning beyond 2 midnights from the point of admission due to high intensity of service, high risk for further deterioration and high frequency of surveillance required.*  Author: Mario LULLA Blanch, MD 07/09/2023 5:46 PM  For on call review [email or web address].   Unresulted Labs (From admission, onward)     Start  Ordered   07/09/23 1730  Hepatic function panel  Add-on,   AD        07/09/23 1729   07/09/23 1730  Magnesium   Add-on,   AD        07/09/23 1729            Orders Placed This Encounter  Procedures   Resp panel by RT-PCR (RSV, Flu A&B, Covid) Anterior Nasal Swab   DG Chest Portable 1 View   CT CHEST WO CONTRAST   CBC with Differential   Basic metabolic panel   Brain natriuretic peptide   Urinalysis, Routine w reflex microscopic -Urine, Clean Catch   Pathologist smear review   Hepatic function panel   Hemoglobin A1c   T4, free   TSH   Lipid panel   Glucose, capillary   Hepatic function panel   Magnesium    Diet Carb Modified Fluid consistency: Thin; Room service appropriate? Yes   Cardiac Monitoring Continuous x 48 hours Indications for use: Other; Other indications for use: Hypertensive urgency   Apply Diabetes Mellitus Care Plan   STAT CBG when hypoglycemia is suspected. If treated, recheck every 15 minutes after each treatment until CBG >/= 70 mg/dl   Refer to Hypoglycemia Protocol Sidebar Report for treatment of CBG < 70 mg/dl   No HS correction Insulin    Strict intake and output   Daily weights   Full code   Consult to hospitalist   Inpatient consult to Cardiology Consult Timeframe: ROUTINE - requires response within 24 hours; Reason for Consult? CHF.   CBG monitoring, ED   EKG 12-Lead   ED EKG   EKG 12-Lead   EKG 12-Lead   EKG   EKG   ECHOCARDIOGRAM  COMPLETE   Place in observation (patient's expected length of stay will be less than 2 midnights)   VAS US  LOWER EXTREMITY VENOUS (DVT)

## 2023-07-09 NOTE — Assessment & Plan Note (Signed)
Lab Results  Component Value Date   CREATININE 1.47 (H) 07/08/2023   CREATININE 1.63 (H) 08/31/2021   CREATININE 1.66 (H) 04/25/2021  Stable avoid contrast and renally dose meds.

## 2023-07-09 NOTE — ED Notes (Signed)
 Reports slight burning sensation across chest after BP lowered. EKG taken. Provided to EDP.

## 2023-07-09 NOTE — Assessment & Plan Note (Signed)
Stable no wheezing on exam. Will start pt on PRN albuterol. Pulmicort neb q 12 h x 1 day.

## 2023-07-09 NOTE — Assessment & Plan Note (Signed)
 Vitals:   07/09/23 0430 07/09/23 0500 07/09/23 0615 07/09/23 0810  BP: (!) 192/84 (!) 201/93 (!) 208/93 (!) 219/80   07/09/23 0900 07/09/23 1038 07/09/23 1122 07/09/23 1124  BP: (!) 225/82 (!) 175/82 (!) 209/87 (!) 180/67   07/09/23 1229 07/09/23 1237 07/09/23 1334 07/09/23 1648  BP: (!) 199/85 (!) 188/80 (!) 154/56 (!) 194/72  Pt has taken her home meds: triple therapy with olmesartan  amlodipine  and hydrochlorothiazide .  Kidney function is stable.  PRN hydralazine .  Will monitor and consider clonidine and monitor HR as HR is below 70's.

## 2023-07-09 NOTE — Progress Notes (Signed)
   07/09/23 1648  Vitals  BP (!) 194/72  MAP (mmHg) 105  ECG Heart Rate 71  MEWS COLOR  MEWS Score Color Green  MEWS Score  MEWS Temp 0  MEWS Systolic 0  MEWS Pulse 0  MEWS RR 0  MEWS LOC 0  MEWS Score 0   MD notified. Ordered to give hydralazine  PRN early.

## 2023-07-09 NOTE — Assessment & Plan Note (Signed)
 Ft4/tsh. Cardiac diet.  Nutrition consult prior to discharge.

## 2023-07-09 NOTE — Assessment & Plan Note (Addendum)
 Glycemic protocol A1c of 7.

## 2023-07-09 NOTE — TOC Initial Note (Addendum)
 Transition of Care [facility]) - Initial/Assessment Note    Patient Details  Name: Jenna Stokes MRN: [id number] Date of Birth: Jul 15, 1965  [facility] CM/SW Contact:    Waddell Barnie Rama, RN Phone Number: 07/09/2023, 12:17 PM  Clinical Narrative:                 From home alone, has  no PCP , will connect with [facility], but has insurance on file, states has no HH services in place at this time or DME at home.  States family member will transport her home at [employer] and family is support system, states gets medications from [facility] at [place].  Pta self ambulatory.   Expected Discharge Plan: Home/Self Care Barriers to Discharge: No Barriers Identified   Patient Goals and CMS Choice Patient states their goals for this hospitalization and ongoing recovery are:: return home   Choice offered to / list presented to : NA      Expected Discharge Plan and Services In-house Referral: NA Discharge Planning Services: CM Consult Post Acute Care Choice: NA Living arrangements for the past 2 months: Single Family Home                 DME Arranged: N/A DME Agency: NA       HH Arranged: NA          Prior Living Arrangements/Services Living arrangements for the past 2 months: Single Family Home Lives with:: Self Patient language and need for interpreter reviewed:: Yes Do you feel safe going back to the place where you live?: Yes      Need for Family Participation in Patient Care: No (Comment) Care giver support system in place?: Yes (comment)   Criminal Activity/Legal Involvement Pertinent to Current Situation/Hospitalization: No - Comment as needed  Activities of Daily Living   ADL Screening (condition at time of admission) Independently performs ADLs?: Yes (appropriate for developmental age) Is the patient deaf or have difficulty hearing?: No Does the patient have difficulty seeing, even when wearing glasses/contacts?: No Does the patient have difficulty  concentrating, remembering, or making decisions?: No  Permission Sought/Granted Permission sought to share information with : Case Manager Permission granted to share information with : Yes, Verbal Permission Granted              Emotional Assessment Appearance:: Appears stated age Attitude/Demeanor/Rapport: Engaged Affect (typically observed): Appropriate Orientation: : Oriented to Self, Oriented to Place, Oriented to  Time, Oriented to Situation Alcohol / Substance Use: Not Applicable Psych Involvement: No (comment)  Admission diagnosis:  Wheezing [R06.2] Hypertensive urgency [I16.0] Patient Active Problem List   Diagnosis Date Noted   Hypertensive urgency 07/08/2023   AKI (acute kidney injury) (HCC) 12/16/2020   Pedal edema 09/30/2020   Lung nodule 01/08/2018   Hyperlipidemia associated with type 2 diabetes mellitus (HCC) 02/18/2017   DM hyperosmolarity type II, uncontrolled (HCC) 04/12/2016   Hypertension associated with diabetes (HCC) 08/19/2013   Morbid obesity (HCC)    PCP:  Patient, No Pcp Per Pharmacy:   [facility] [id number] [place], [place] - [address] [place] [address] [place] [address] Phone: [phone] Fax: [phone]  [facility] - [place], [place] - [address] [place] [address] Phone: [phone] Fax: [phone]  [facility] [address] [place] [address] Phone: [phone] Fax: [phone]     Social Drivers of Health (SDOH) Social History: SDOH  Screenings   Food Insecurity: No Food Insecurity (07/09/2023)  Housing: Low Risk  (07/09/2023)  Transportation Needs: No Transportation Needs (07/09/2023)  Utilities: Not At Risk (07/09/2023)  Physical Activity: Inactive (01/12/2018)  Stress: No Stress Concern Present (01/12/2018)  Tobacco Use: Low Risk  (07/08/2023)   SDOH Interventions:     Readmission Risk  Interventions     No data to display

## 2023-07-09 NOTE — Assessment & Plan Note (Signed)
Bilateral venous doppler and will treat if positive.

## 2023-07-10 DIAGNOSIS — E1122 Type 2 diabetes mellitus with diabetic chronic kidney disease: Secondary | ICD-10-CM

## 2023-07-10 DIAGNOSIS — R0602 Shortness of breath: Secondary | ICD-10-CM | POA: Diagnosis not present

## 2023-07-10 DIAGNOSIS — I160 Hypertensive urgency: Secondary | ICD-10-CM | POA: Diagnosis not present

## 2023-07-10 DIAGNOSIS — R600 Localized edema: Secondary | ICD-10-CM

## 2023-07-10 DIAGNOSIS — N1832 Chronic kidney disease, stage 3b: Secondary | ICD-10-CM | POA: Diagnosis not present

## 2023-07-10 LAB — GLUCOSE, CAPILLARY
Glucose-Capillary: 155 mg/dL — ABNORMAL HIGH (ref 70–99)
Glucose-Capillary: 140 mg/dL — ABNORMAL HIGH (ref 70–99)
Glucose-Capillary: 150 mg/dL — ABNORMAL HIGH (ref 70–99)
Glucose-Capillary: 177 mg/dL — ABNORMAL HIGH (ref 70–99)

## 2023-07-10 LAB — BASIC METABOLIC PANEL
Anion gap: 11 (ref 5–15)
BUN: 35 mg/dL — ABNORMAL HIGH (ref 6–20)
CO2: 26 mmol/L (ref 22–32)
Calcium: 9.4 mg/dL (ref 8.9–10.3)
Chloride: 104 mmol/L (ref 98–111)
Creatinine, Ser: 1.7 mg/dL — ABNORMAL HIGH (ref 0.44–1.00)
GFR, Estimated: 35 mL/min — ABNORMAL LOW (ref >60)
Glucose, Bld: 216 mg/dL — ABNORMAL HIGH (ref 70–99)
Potassium: 3.3 mmol/L — ABNORMAL LOW (ref 3.5–5.1)
Sodium: 141 mmol/L (ref 135–145)

## 2023-07-10 MED ORDER — ENOXAPARIN SODIUM 80 MG/0.8ML IJ SOSY
80.0000 mg | PREFILLED_SYRINGE | Freq: Every day | INTRAMUSCULAR | Status: DC
  Administered 2023-07-10 – 2023-07-12 (×3): 80 mg via SUBCUTANEOUS
  Filled 2023-07-10 (×4): qty 0.8

## 2023-07-10 MED ORDER — POTASSIUM CHLORIDE CRYS ER 20 MEQ PO TBCR
40.0000 meq | EXTENDED_RELEASE_TABLET | Freq: Once | ORAL | Status: AC
  Administered 2023-07-10: 40 meq via ORAL
  Filled 2023-07-10: qty 2

## 2023-07-10 MED ORDER — AMLODIPINE BESYLATE 5 MG PO TABS
5.0000 mg | ORAL_TABLET | Freq: Every day | ORAL | Status: DC
  Administered 2023-07-10: 5 mg via ORAL
  Filled 2023-07-10: qty 1

## 2023-07-10 MED ORDER — FUROSEMIDE 20 MG PO TABS
20.0000 mg | ORAL_TABLET | Freq: Every day | ORAL | Status: DC
  Administered 2023-07-11 – 2023-07-13 (×3): 20 mg via ORAL
  Filled 2023-07-10 (×3): qty 1

## 2023-07-10 MED ORDER — ISOSORB DINITRATE-HYDRALAZINE 20-37.5 MG PO TABS
1.0000 | ORAL_TABLET | Freq: Two times a day (BID) | ORAL | Status: DC
  Administered 2023-07-10 – 2023-07-13 (×7): 1 via ORAL
  Filled 2023-07-10 (×7): qty 1

## 2023-07-10 MED ORDER — AMLODIPINE BESYLATE 10 MG PO TABS
10.0000 mg | ORAL_TABLET | Freq: Every day | ORAL | Status: DC
  Administered 2023-07-11 – 2023-07-13 (×3): 10 mg via ORAL
  Filled 2023-07-10 (×3): qty 1

## 2023-07-10 NOTE — Progress Notes (Signed)
 Progress Note   Patient: Jenna Stokes FMW:[id number] DOB: Jan 15, 1966 DOA: 07/08/2023     0 DOS: the patient was seen and examined on 07/10/2023   Brief hospital admission course: As per H&P written by Dr. Tobie on 07/09/2023 Jenna Stokes is a 58 y.o. female with past medical history  of  morbid obesity with BMI of 49.97, Essential HTN, DM II, and CKD coming with SOB since November 2024 and now SOBOE for past few days. Pt denies any  le edema or orthopnea. Pt states she is looking for PCP and has never seen cardiology. No other complaints and is stable otherwise on RA. BP has remained high.    >>ED Course:direct admit.  At bedside nontoxic.   Assessment and Plan: * SOBOE (shortness of breath on exertion) -CT scan of the chest done demonstrating acute abnormalities -Patient is afebrile with normal WBCs -Moderate ventricular hypertrophy with ejection fraction 70-75% most likely component of diastolic component in the setting of uncontrolled hypertension. -Continue adequate blood pressure control -Maintain adequate hydration; daily Lasix  and low-sodium diet discussed with patient. -Close outpatient follow-up with PCP.  Pedal edema -Bilateral Doppler negative for DVT -Low-sodium diet, daily Lasix  and discussion regarding stocking socks has been provided.  Hypertensive urgency Vitals:   07/09/23 0430 07/09/23 0500 07/09/23 0615 07/09/23 0810  BP: (!) 192/84 (!) 201/93 (!) 208/93 (!) 219/80   07/09/23 0900 07/09/23 1038 07/09/23 1122 07/09/23 1124  BP: (!) 225/82 (!) 175/82 (!) 209/87 (!) 180/67   07/09/23 1229 07/09/23 1237 07/09/23 1334 07/09/23 1648  BP: (!) 199/85 (!) 188/80 (!) 154/56 (!) 194/72  -Continue Cozaar -Started on, BiDil  and Lasix  -Follow-up renal function and blood pressure control. -Low-sodium diet discussed with patient.  DM (diabetes mellitus) (HCC) -Continue sliding scale insulin  while inpatient -A1c 7.0; patient was using Mounjaro  as an outpatient but insurance  has stopped paying for it. -At this moment oral hypoglycemic agents could be initiated while PCP advocate on patient's behalf for prior authorization -Modified Kawatu diet discussed with patient.  Morbid obesity (HCC) -Body mass index is 49.91 kg/m.  -Low-calorie diet, portion control and increase physical activity discussed with patient.  CKD stage 3b, GFR 30-44 ml/min (HCC) Lab Results  Component Value Date   CREATININE 1.47 (H) 07/08/2023   CREATININE 1.63 (H) 08/31/2021   CREATININE 1.66 (H) 04/25/2021  -Minimize the use of contrast and avoid hypotension -Follow-up renal function trend -Creatinine 1.7 on today's exam -maintain adequate Hydration and follow renal function trend..  Chronic obstructive pulmonary disease (HCC) Stable no wheezing on exam. Will start pt on PRN albuterol . Pulmicort  neb q 12 h x 1 day.   Subjective:  Morbidly obese; no chest pain, no fever, no nausea vomiting.  Reports feeling slightly better.  Still complaining of short winded sensation with activity.  Blood pressure still elevated.  Physical Exam: Vitals:   07/10/23 0755 07/10/23 0849 07/10/23 1128 07/10/23 1205  BP:  (!) 166/73 (!) 196/95 (!) 217/96  Pulse:  70 62 64  Resp: 18 16 18 16   Temp:   98.1 F (36.7 C)   TempSrc:   Oral   SpO2: 100%  96%   Weight:      Height:       General exam: Alert, awake, oriented x 3; no requiring oxygen supplementation and feeling slightly better. Patient denies nausea, vomiting, abdominal pain. Respiratory system: Decreased breath sounds at the bases; no wheezing, no using accessory muscle.  Normal respiratory effort. Cardiovascular system:RRR. No rubs or  gallops; unable to assess JVD with body habitus. Gastrointestinal system: Abdomen is obese, nondistended, soft and nontender. No organomegaly or masses felt. Normal bowel sounds heard. Central nervous system: No focal neurological deficits. Extremities: No cyanosis or clubbing; trace edema appreciated  bilaterally. Skin: No rashes, lesions or ulcers Psychiatry: Judgement and insight appear normal. Mood & affect appropriate.   Data Reviewed: 2D echo: Demonstrating ejection fraction 70-75%, moderate left ventricle hypertrophy and no significant valvular disorder. CBC: WBC 6.8, hemoglobin 12.7 and platelet count 1 67K Respiratory panel:Negative for COVID, RSV and influenza A1c: 7.0 lipid panel: Throat 22, LDL 143 Total and HDL 44 Basic metabolic panel: Sodium 141, potassium 3.3, chloride 104, bicarb 26, BUN 35, creatinine 1.70 and GFR 35.  Family Communication: No family at bedside.  Disposition: Status is: Observation The patient remains OBS appropriate and will d/c before 2 midnights.   Planned Discharge Destination: Home  Time spent: 50 minutes  Author: Eric Nunnery, MD 07/10/2023 1:08 PM  For on call review [email or web address].

## 2023-07-10 NOTE — Progress Notes (Addendum)
   07/10/23 1205  Assess: MEWS Score  BP (!) 217/96  MAP (mmHg) 124  Pulse Rate 64  ECG Heart Rate 66  Resp 16  Assess: MEWS Score  MEWS Temp 0  MEWS Systolic 2  MEWS Pulse 0  MEWS RR 0  MEWS LOC 0  MEWS Score 2  MEWS Score Color Yellow  Assess: SIRS CRITERIA  SIRS Temperature  0  SIRS Respirations  0  SIRS Pulse 0  SIRS WBC 0  SIRS Score Sum  0   Patient is lying in bed, asymptomatic. RN adminstered prn IV hydralazine  for SBP > 170. Patient educated to remain in bed for close BP monitoring. Patient verbalize understanding.

## 2023-07-10 NOTE — Plan of Care (Signed)
  Problem: Health Behavior/Discharge Planning: Goal: Ability to manage health-related needs will improve Outcome: Progressing   Problem: Clinical Measurements: Goal: Ability to maintain clinical measurements within normal limits will improve Outcome: Progressing   Problem: Activity: Goal: Risk for activity intolerance will decrease Outcome: Progressing   

## 2023-07-11 ENCOUNTER — Telehealth (HOSPITAL_COMMUNITY): Payer: Self-pay | Admitting: Pharmacy Technician

## 2023-07-11 ENCOUNTER — Other Ambulatory Visit (HOSPITAL_COMMUNITY): Payer: Self-pay

## 2023-07-11 DIAGNOSIS — I169 Hypertensive crisis, unspecified: Secondary | ICD-10-CM | POA: Diagnosis present

## 2023-07-11 DIAGNOSIS — N179 Acute kidney failure, unspecified: Secondary | ICD-10-CM | POA: Diagnosis present

## 2023-07-11 DIAGNOSIS — E1122 Type 2 diabetes mellitus with diabetic chronic kidney disease: Secondary | ICD-10-CM | POA: Diagnosis present

## 2023-07-11 DIAGNOSIS — I160 Hypertensive urgency: Secondary | ICD-10-CM | POA: Diagnosis present

## 2023-07-11 DIAGNOSIS — Z6841 Body Mass Index (BMI) 40.0 and over, adult: Secondary | ICD-10-CM | POA: Diagnosis not present

## 2023-07-11 DIAGNOSIS — Z7984 Long term (current) use of oral hypoglycemic drugs: Secondary | ICD-10-CM | POA: Diagnosis not present

## 2023-07-11 DIAGNOSIS — Z833 Family history of diabetes mellitus: Secondary | ICD-10-CM | POA: Diagnosis not present

## 2023-07-11 DIAGNOSIS — R062 Wheezing: Secondary | ICD-10-CM

## 2023-07-11 DIAGNOSIS — E876 Hypokalemia: Secondary | ICD-10-CM | POA: Diagnosis present

## 2023-07-11 DIAGNOSIS — E785 Hyperlipidemia, unspecified: Secondary | ICD-10-CM | POA: Diagnosis present

## 2023-07-11 DIAGNOSIS — Z1152 Encounter for screening for COVID-19: Secondary | ICD-10-CM | POA: Diagnosis not present

## 2023-07-11 DIAGNOSIS — Z7985 Long-term (current) use of injectable non-insulin antidiabetic drugs: Secondary | ICD-10-CM | POA: Diagnosis not present

## 2023-07-11 DIAGNOSIS — N1832 Chronic kidney disease, stage 3b: Secondary | ICD-10-CM | POA: Diagnosis present

## 2023-07-11 DIAGNOSIS — Z888 Allergy status to other drugs, medicaments and biological substances status: Secondary | ICD-10-CM | POA: Diagnosis not present

## 2023-07-11 DIAGNOSIS — I129 Hypertensive chronic kidney disease with stage 1 through stage 4 chronic kidney disease, or unspecified chronic kidney disease: Secondary | ICD-10-CM | POA: Diagnosis present

## 2023-07-11 DIAGNOSIS — R0602 Shortness of breath: Secondary | ICD-10-CM | POA: Diagnosis not present

## 2023-07-11 DIAGNOSIS — J449 Chronic obstructive pulmonary disease, unspecified: Secondary | ICD-10-CM | POA: Diagnosis present

## 2023-07-11 DIAGNOSIS — Z8249 Family history of ischemic heart disease and other diseases of the circulatory system: Secondary | ICD-10-CM | POA: Diagnosis not present

## 2023-07-11 DIAGNOSIS — Z79899 Other long term (current) drug therapy: Secondary | ICD-10-CM | POA: Diagnosis not present

## 2023-07-11 DIAGNOSIS — Z880 Allergy status to penicillin: Secondary | ICD-10-CM | POA: Diagnosis not present

## 2023-07-11 LAB — BASIC METABOLIC PANEL
Anion gap: 9 (ref 5–15)
BUN: 35 mg/dL — ABNORMAL HIGH (ref 6–20)
CO2: 27 mmol/L (ref 22–32)
Calcium: 9.2 mg/dL (ref 8.9–10.3)
Chloride: 102 mmol/L (ref 98–111)
Creatinine, Ser: 1.55 mg/dL — ABNORMAL HIGH (ref 0.44–1.00)
GFR, Estimated: 39 mL/min — ABNORMAL LOW (ref >60)
Glucose, Bld: 146 mg/dL — ABNORMAL HIGH (ref 70–99)
Potassium: 3.7 mmol/L (ref 3.5–5.1)
Sodium: 138 mmol/L (ref 135–145)

## 2023-07-11 LAB — GLUCOSE, CAPILLARY
Glucose-Capillary: 160 mg/dL — ABNORMAL HIGH (ref 70–99)
Glucose-Capillary: 196 mg/dL — ABNORMAL HIGH (ref 70–99)
Glucose-Capillary: 171 mg/dL — ABNORMAL HIGH (ref 70–99)
Glucose-Capillary: 199 mg/dL — ABNORMAL HIGH (ref 70–99)

## 2023-07-11 NOTE — Hospital Course (Signed)
 58 y.o. female with past medical history  of  morbid obesity with BMI of 49.97, Essential HTN, DM II, and CKD coming with SOB since November 2024 and now SOBOE for past few days. Pt denies any  le edema or orthopnea. Pt states she is looking for PCP and has never seen cardiology. No other complaints and is stable otherwise on RA. BP has remained high.

## 2023-07-11 NOTE — Telephone Encounter (Signed)
 Patient Product/process Development Scientist completed.    The patient is insured through [facility]. Patient has [employer], may use a copay card, and/or apply for patient assistance if available.    Ran test claim for isosorbide -hydralazine  (Bidil ) 20-37.5 mg and the current 30 day co-pay is $16.00.   This test claim was processed through [facility]- copay amounts may vary at other pharmacies due to pharmacy/plan contracts, or as the patient moves through the different stages of their insurance plan.     Reyes Sharps, CPHT Pharmacy Technician III Certified Patient Advocate [facility] Patient Advocate Team Direct Number: [phone]  Fax: [phone]

## 2023-07-11 NOTE — Evaluation (Signed)
 Physical Therapy Evaluation Patient Details Name: Jenna Stokes MRN: [id number] DOB: 12-07-1965 Today's Date: 07/11/2023  History of Present Illness  58 y.o. female presents to [facility] hospital on 07/08/2023 with DOE. PMH includes obesity, HTN, DMII, CKD.  Clinical Impression  Pt presents to PT without significant deficits in mobility or activity tolerance. Pt maintains oxygen saturation within mid 90s with activity and denies DOE at this time. Pt has no current acute PT needs at this time. PT signing off.        If plan is discharge home, recommend the following:     Can travel by private vehicle        Equipment Recommendations None recommended by PT  Recommendations for Other Services       Functional Status Assessment Patient has not had a recent decline in their functional status     Precautions / Restrictions Precautions Precautions: None Restrictions Weight Bearing Restrictions Per Provider Order: No      Mobility  Bed Mobility Overal bed mobility: Independent                  Transfers Overall transfer level: Independent                      Ambulation/Gait Ambulation/Gait assistance: Independent Gait Distance (Feet): 500 Feet Assistive device: None Gait Pattern/deviations: Step-through pattern Gait velocity: functional Gait velocity interpretation: >2.62 ft/sec, indicative of community ambulatory   General Gait Details: steady step-through gait  Stairs            Wheelchair Mobility     Tilt Bed    Modified Rankin (Stroke Patients Only)       Balance Overall balance assessment: Independent                                           Pertinent Vitals/Pain Pain Assessment Pain Assessment: No/denies pain    Home Living Family/patient expects to be discharged to:: Private residence Living Arrangements: Children Available Help at Discharge: Family;Available PRN/intermittently Type of Home: House Home  Access: Level entry       Home Layout: One level Home Equipment: None      Prior Function Prior Level of Function : Independent/Modified Independent;Working/employed;Driving                     Extremity/Trunk Assessment   Upper Extremity Assessment Upper Extremity Assessment: Overall WFL for tasks assessed    Lower Extremity Assessment Lower Extremity Assessment: Overall WFL for tasks assessed    Cervical / Trunk Assessment Cervical / Trunk Assessment: Other exceptions Cervical / Trunk Exceptions: body habitus  Communication   Communication Communication: No apparent difficulties Cueing Techniques: Verbal cues  Cognition Arousal: Alert Behavior During Therapy: WFL for tasks assessed/performed Overall Cognitive Status: Within Functional Limits for tasks assessed                                          General Comments General comments (skin integrity, edema, etc.): VSS on RA, sats in mid 90s with activity. Pt denies DOE    Exercises     Assessment/Plan    PT Assessment Patient does not need any further PT services  PT Problem List         PT  Treatment Interventions      PT Goals (Current goals can be found in the Care Plan section)       Frequency       Co-evaluation               AM-PAC PT 6 Clicks Mobility  Outcome Measure Help needed turning from your back to your side while in a flat bed without using bedrails?: None Help needed moving from lying on your back to sitting on the side of a flat bed without using bedrails?: None Help needed moving to and from a bed to a chair (including a wheelchair)?: None Help needed standing up from a chair using your arms (e.g., wheelchair or bedside chair)?: None Help needed to walk in hospital room?: None Help needed climbing 3-5 steps with a railing? : None 6 Click Score: 24    End of Session   Activity Tolerance: Patient tolerated treatment well Patient left: in  chair;with call bell/phone within reach Nurse Communication: Mobility status PT Visit Diagnosis: Other abnormalities of gait and mobility (R26.89)    Time: 9160-9150 PT Time Calculation (min) (ACUTE ONLY): 10 min   Charges:   PT Evaluation $PT Eval Low Complexity: 1 Low   PT General Charges $$ ACUTE PT VISIT: 1 Visit         Bernardino JINNY Ruth, PT, DPT Acute Rehabilitation Office [phone]   Bernardino JINNY Ruth 07/11/2023, 9:00 AM

## 2023-07-11 NOTE — Plan of Care (Signed)

## 2023-07-11 NOTE — Progress Notes (Signed)
  Progress Note   Patient: Jenna Stokes FMW:[id number] DOB: [birth year] DOA: 07/08/2023     0 DOS: the patient was seen and examined on 07/11/2023   Brief hospital course: 58 y.o. female with past medical history  of  morbid obesity with BMI of 49.97, Essential HTN, DM II, and CKD coming with SOB since November 2024 and now SOBOE for past few days. Pt denies any  le edema or orthopnea. Pt states she is looking for PCP and has never seen cardiology. No other complaints and is stable otherwise on RA. BP has remained high.   Assessment and Plan: SOBOE (shortness of breath on exertion) -CT scan of the chest done demonstrating acute abnormalities -Patient is afebrile with normal WBCs -Moderate ventricular hypertrophy with ejection fraction 70-75% most likely component of diastolic component in the setting of uncontrolled hypertension. -Continue adequate blood pressure control -Maintain adequate hydration; daily Lasix  and low-sodium diet discussed with patient. -Close outpatient follow-up with PCP.   Pedal edema -Bilateral Doppler negative for DVT -Low-sodium diet, daily Lasix  and discussion regarding stocking socks has been provided.   Hypertensive urgency -Continue Cozaar -Started on, BiDil  and Lasix  -BP somewhat improved, still requiring IV hydralazine    DM (diabetes mellitus) (HCC) -Continue sliding scale insulin  while inpatient -A1c 7.0; patient was using Mounjaro  as an outpatient but insurance has stopped paying for it. -At this moment oral hypoglycemic agents could be initiated while PCP advocate on patient's behalf for prior authorization -Modified Kawatu diet discussed with patient by Dr. Ricky   Morbid obesity (HCC) -Body mass index is 49.91 kg/m.  -Low-calorie diet, portion control and increase physical activity discussed with patient.   CKD stage 3b, GFR 30-44 ml/min (HCC) -Minimize the use of contrast and avoid hypotension -Follow-up renal function trend.  Improving -Creatinine 1.55 on today's exam -maintain adequate Hydration and follow renal function trend..   Chronic obstructive pulmonary disease (HCC) Stable no wheezing on exam. Continue PRN albuterol . Pulmicort  neb q 12 h x 1 day.   Subjective: Eager to go home soon  Physical Exam: Vitals:   07/11/23 0533 07/11/23 0820 07/11/23 1102 07/11/23 1615  BP: (!) 165/87 (S) (!) 194/79 (!) 154/65 (!) 184/80  Pulse:  71 79 80  Resp: 16 18 18 18   Temp: 98 F (36.7 C) 98 F (36.7 C) 98 F (36.7 C) 98 F (36.7 C)  TempSrc: Oral Oral Oral Oral  SpO2: 95% 94% 97% 96%  Weight: (!) 155 kg     Height:       General exam: Awake, laying in bed, in nad Respiratory system: Normal respiratory effort, no wheezing Cardiovascular system: regular rate, s1, s2 Gastrointestinal system: Soft, nondistended, positive BS Central nervous system: CN2-12 grossly intact, strength intact Extremities: Perfused, no clubbing Skin: Normal skin turgor, no notable skin lesions seen Psychiatry: Mood normal // no visual hallucinations   Data Reviewed:  Labs reviewed: 138, K 3.7, Cr 1.55  Disposition: Status is: Observation The patient will require care spanning > 2 midnights and should be moved to inpatient because: severity of illness  Planned Discharge Destination: Home    Author: Garnette Pelt, MD 07/11/2023 6:14 PM  For on call review [email or web address].

## 2023-07-12 DIAGNOSIS — R0602 Shortness of breath: Secondary | ICD-10-CM | POA: Diagnosis not present

## 2023-07-12 DIAGNOSIS — R062 Wheezing: Secondary | ICD-10-CM | POA: Diagnosis not present

## 2023-07-12 DIAGNOSIS — I160 Hypertensive urgency: Secondary | ICD-10-CM | POA: Diagnosis not present

## 2023-07-12 LAB — CBC
HCT: 36.7 % (ref 36.0–46.0)
Hemoglobin: 12 g/dL (ref 12.0–15.0)
MCH: 29.3 pg (ref 26.0–34.0)
MCHC: 32.7 g/dL (ref 30.0–36.0)
MCV: 89.7 fL (ref 80.0–100.0)
Platelets: 234 10*3/uL (ref 150–400)
RBC: 4.09 MIL/uL (ref 3.87–5.11)
RDW: 12.6 % (ref 11.5–15.5)
WBC: 8.7 10*3/uL (ref 4.0–10.5)
nRBC: 0 % (ref 0.0–0.2)

## 2023-07-12 LAB — COMPREHENSIVE METABOLIC PANEL
ALT: 15 U/L (ref 0–44)
AST: 17 U/L (ref 15–41)
Albumin: 2.9 g/dL — ABNORMAL LOW (ref 3.5–5.0)
Alkaline Phosphatase: 62 U/L (ref 38–126)
Anion gap: 12 (ref 5–15)
BUN: 26 mg/dL — ABNORMAL HIGH (ref 6–20)
CO2: 24 mmol/L (ref 22–32)
Calcium: 9.8 mg/dL (ref 8.9–10.3)
Chloride: 103 mmol/L (ref 98–111)
Creatinine, Ser: 1.35 mg/dL — ABNORMAL HIGH (ref 0.44–1.00)
GFR, Estimated: 46 mL/min — ABNORMAL LOW (ref >60)
Glucose, Bld: 165 mg/dL — ABNORMAL HIGH (ref 70–99)
Potassium: 3.5 mmol/L (ref 3.5–5.1)
Sodium: 139 mmol/L (ref 135–145)
Total Bilirubin: 0.3 mg/dL (ref 0.0–1.2)
Total Protein: 5.8 g/dL — ABNORMAL LOW (ref 6.5–8.1)

## 2023-07-12 LAB — GLUCOSE, CAPILLARY
Glucose-Capillary: 144 mg/dL — ABNORMAL HIGH (ref 70–99)
Glucose-Capillary: 178 mg/dL — ABNORMAL HIGH (ref 70–99)
Glucose-Capillary: 157 mg/dL — ABNORMAL HIGH (ref 70–99)
Glucose-Capillary: 205 mg/dL — ABNORMAL HIGH (ref 70–99)

## 2023-07-12 NOTE — Plan of Care (Signed)
  Problem: Education: Goal: Knowledge of General Education information will improve Description: Including pain rating scale, medication(s)/side effects and non-pharmacologic comfort measures Outcome: Progressing   Problem: Health Behavior/Discharge Planning: Goal: Ability to manage health-related needs will improve Outcome: Progressing   Problem: Nutrition: Goal: Adequate nutrition will be maintained Outcome: Progressing   Problem: Fluid Volume: Goal: Ability to maintain a balanced intake and output will improve Outcome: Progressing   Problem: Metabolic: Goal: Ability to maintain appropriate glucose levels will improve Outcome: Progressing

## 2023-07-12 NOTE — Progress Notes (Signed)
  Progress Note   Patient: Jenna Stokes FMW:[id number] DOB: [birth year] DOA: 07/08/2023     1 DOS: the patient was seen and examined on 07/12/2023   Brief hospital course: 58 y.o. female with past medical history  of  morbid obesity with BMI of 49.97, Essential HTN, DM II, and CKD coming with SOB since November 2024 and now SOBOE for past few days. Pt denies any  le edema or orthopnea. Pt states she is looking for PCP and has never seen cardiology. No other complaints and is stable otherwise on RA. BP has remained high.   Assessment and Plan: SOBOE (shortness of breath on exertion) -CT scan of the chest done demonstrating acute abnormalities -Patient is afebrile with normal WBCs -Moderate ventricular hypertrophy with ejection fraction 70-75% most likely component of diastolic component in the setting of uncontrolled hypertension. -Continue adequate blood pressure control -Maintain adequate hydration; daily Lasix  and low-sodium diet discussed with patient. -Close outpatient follow-up with PCP.   Pedal edema -Bilateral Doppler negative for DVT -Low-sodium diet, daily Lasix  and discussion regarding stocking socks has been provided.   Hypertensive urgency -Continue Cozaar -Started on, BiDil  and Lasix  -BP still required IV hydralazine  this AM, seems to be more stable now -recommend f/u with Dr. Raford of Cardiology   DM (diabetes mellitus) (HCC) -Continue sliding scale insulin  while inpatient -A1c 7.0; patient was using Mounjaro  as an outpatient but insurance has stopped paying for it. -At this moment oral hypoglycemic agents could be initiated while PCP advocate on patient's behalf for prior authorization -Modified Kawatu diet discussed with patient by Dr. Ricky   Morbid obesity (HCC) -Body mass index is 49.91 kg/m.  -Low-calorie diet, portion control and increase physical activity discussed with patient.   CKD stage 3b, GFR 30-44 ml/min (HCC) -Minimize the use of contrast and  avoid hypotension -Follow-up renal function trend. Improving -Creatinine 1.55 on today's exam -maintain adequate Hydration and follow renal function trend..   Chronic obstructive pulmonary disease (HCC) Stable no wheezing on exam. Continue PRN albuterol . Pulmicort  neb q 12 h x 1 day.   Subjective: Asking about going home  Physical Exam: Vitals:   07/12/23 0756 07/12/23 1013 07/12/23 1113 07/12/23 1611  BP:  (!) 179/73 (!) 168/71 (!) 167/78  Pulse: 79  81 79  Resp: 18   18  Temp:   97.6 F (36.4 C) 98.2 F (36.8 C)  TempSrc:   Oral Oral  SpO2:   96%   Weight:      Height:       General exam: Conversant, in no acute distress Respiratory system: normal chest rise, clear, no audible wheezing Cardiovascular system: regular rhythm, s1-s2 Gastrointestinal system: Nondistended, nontender, pos BS Central nervous system: No seizures, no tremors Extremities: No cyanosis, no joint deformities Skin: No rashes, no pallor Psychiatry: Affect normal // no auditory hallucinations   Data Reviewed:  Labs reviewed: Na 139, K 3.5, Cr 1.35  Disposition: Status is: Observation The patient will require care spanning > 2 midnights and should be moved to inpatient because: severity of illness  Planned Discharge Destination: Home    Author: Garnette Pelt, MD 07/12/2023 6:44 PM  For on call review [email or web address].

## 2023-07-13 ENCOUNTER — Telehealth: Payer: Self-pay | Admitting: Cardiology

## 2023-07-13 DIAGNOSIS — N1832 Chronic kidney disease, stage 3b: Secondary | ICD-10-CM | POA: Diagnosis not present

## 2023-07-13 DIAGNOSIS — R0602 Shortness of breath: Secondary | ICD-10-CM | POA: Diagnosis not present

## 2023-07-13 DIAGNOSIS — I160 Hypertensive urgency: Secondary | ICD-10-CM | POA: Diagnosis not present

## 2023-07-13 LAB — COMPREHENSIVE METABOLIC PANEL
ALT: 16 U/L (ref 0–44)
AST: 14 U/L — ABNORMAL LOW (ref 15–41)
Albumin: 3 g/dL — ABNORMAL LOW (ref 3.5–5.0)
Alkaline Phosphatase: 66 U/L (ref 38–126)
Anion gap: 11 (ref 5–15)
BUN: 22 mg/dL — ABNORMAL HIGH (ref 6–20)
CO2: 24 mmol/L (ref 22–32)
Calcium: 9.6 mg/dL (ref 8.9–10.3)
Chloride: 103 mmol/L (ref 98–111)
Creatinine, Ser: 1.42 mg/dL — ABNORMAL HIGH (ref 0.44–1.00)
GFR, Estimated: 43 mL/min — ABNORMAL LOW (ref >60)
Glucose, Bld: 176 mg/dL — ABNORMAL HIGH (ref 70–99)
Potassium: 3.6 mmol/L (ref 3.5–5.1)
Sodium: 138 mmol/L (ref 135–145)
Total Bilirubin: 0.4 mg/dL (ref 0.0–1.2)
Total Protein: 6.1 g/dL — ABNORMAL LOW (ref 6.5–8.1)

## 2023-07-13 LAB — GLUCOSE, CAPILLARY
Glucose-Capillary: 144 mg/dL — ABNORMAL HIGH (ref 70–99)
Glucose-Capillary: 190 mg/dL — ABNORMAL HIGH (ref 70–99)
Glucose-Capillary: 175 mg/dL — ABNORMAL HIGH (ref 70–99)

## 2023-07-13 MED ORDER — CYCLOBENZAPRINE HCL 5 MG PO TABS: 5.0000 mg | ORAL_TABLET | Freq: Three times a day (TID) | ORAL | Status: DC | PRN

## 2023-07-13 MED ORDER — ISOSORB DINITRATE-HYDRALAZINE 20-37.5 MG PO TABS: 2.0000 | ORAL_TABLET | Freq: Two times a day (BID) | ORAL | 0 refills | Status: DC

## 2023-07-13 MED ORDER — FUROSEMIDE 20 MG PO TABS: 20.0000 mg | ORAL_TABLET | Freq: Every day | ORAL | 0 refills | Status: DC

## 2023-07-13 MED ORDER — ISOSORB DINITRATE-HYDRALAZINE 20-37.5 MG PO TABS
2.0000 | ORAL_TABLET | Freq: Two times a day (BID) | ORAL | Status: DC
  Filled 2023-07-13: qty 2

## 2023-07-13 MED ORDER — AMLODIPINE BESYLATE 10 MG PO TABS: 10.0000 mg | ORAL_TABLET | Freq: Every day | ORAL | 0 refills | Status: DC

## 2023-07-13 MED ORDER — ISOSORB DINITRATE-HYDRALAZINE 20-37.5 MG PO TABS
2.0000 | ORAL_TABLET | Freq: Two times a day (BID) | ORAL | Status: DC
  Filled 2023-07-13 (×2): qty 2

## 2023-07-13 MED ORDER — IRBESARTAN 300 MG PO TABS: 300.0000 mg | ORAL_TABLET | Freq: Every day | ORAL | 0 refills | Status: DC

## 2023-07-13 MED ORDER — ISOSORB DINITRATE-HYDRALAZINE 20-37.5 MG PO TABS: 2.0000 | ORAL_TABLET | Freq: Two times a day (BID) | ORAL | Status: DC

## 2023-07-13 MED ORDER — ISOSORB DINITRATE-HYDRALAZINE 20-37.5 MG PO TABS
1.0000 | ORAL_TABLET | Freq: Once | ORAL | Status: AC
  Administered 2023-07-13: 1 via ORAL
  Filled 2023-07-13: qty 1

## 2023-07-13 NOTE — Progress Notes (Signed)
 Patient ready for discharge. Reviewed AVS with patient and a copy of it given to patient to take home. Patient verbalize understanding of her medications and follow up appointments. Patient given her discharge medication from the hospital pharmacy ([facility] ) and the rest of the prescriptions have been sent and ready for pickup from her pharmacy. Patient would like to eat dinner as she awaits for her transportation to arrive.

## 2023-07-13 NOTE — Discharge Summary (Signed)
 Physician Discharge Summary   Patient: Jenna Stokes MRN: [id number] DOB: 1965-11-25  Admit date:     07/08/2023  Discharge date: 07/13/23  Discharge Physician: Garnette Pelt   PCP: Patient, No Pcp Per   Recommendations at discharge:    Follow up with PCP in 1-2 weeks Follow up with Dr. Raford as scheduled  Discharge Diagnoses: Principal Problem:   SOBOE (shortness of breath on exertion) Active Problems:   Pedal edema   Hypertensive urgency   DM (diabetes mellitus) (HCC)   Morbid obesity (HCC)   Chronic obstructive pulmonary disease (HCC)   CKD stage 3b, GFR 30-44 ml/min (HCC)   Hypertensive crisis  Resolved Problems:   * No resolved hospital problems. *  Hospital Course: 58 y.o. female with past medical history  of  morbid obesity with BMI of 49.97, Essential HTN, DM II, and CKD coming with SOB since November 2024 and now SOBOE for past few days. Pt denies any  le edema or orthopnea. Pt states she is looking for PCP and has never seen cardiology. No other complaints and is stable otherwise on RA. BP has remained high.   Assessment and Plan: SOBOE (shortness of breath on exertion) -CT scan of the chest done demonstrating acute abnormalities -Patient is afebrile with normal WBCs -Moderate ventricular hypertrophy with ejection fraction 70-75% most likely component of diastolic component in the setting of uncontrolled hypertension. -Continue adequate blood pressure control -Maintain adequate hydration; daily Lasix  and low-sodium diet discussed with patient. -Close outpatient follow-up with PCP.   Pedal edema -Bilateral Doppler negative for DVT -Low-sodium diet, daily Lasix  and discussion regarding stocking socks has been provided.   Hypertensive urgency -Continue Cozaar -Started on, BiDil  and Lasix  -BP now improved after increasing bidil  to 2 tabs bid -recommend f/u with Dr. Raford of Cardiology   DM (diabetes mellitus) (HCC) -Continue sliding scale insulin  while  inpatient -A1c 7.0; patient was using Mounjaro  as an outpatient but insurance has stopped paying for it. -At this moment oral hypoglycemic agents could be initiated while PCP advocate on patient's behalf for prior authorization -Modified Kawatu diet discussed with patient by Dr. Ricky   Morbid obesity (HCC) -Body mass index is 49.91 kg/m.  -Low-calorie diet, portion control and increase physical activity discussed with patient.   CKD stage 3b, GFR 30-44 ml/min (HCC) -Minimize the use of contrast and avoid hypotension -Follow-up renal function improved   Chronic obstructive pulmonary disease (HCC) Stable no wheezing on exam. Continued PRN albuterol  while in hospital Pulmicort  neb q 12 h x 1 day.    Consultants:  Procedures performed:   Disposition: Home Diet recommendation:  Cardiac diet DISCHARGE MEDICATION: Allergies as of 07/13/2023       Reactions   Penicillins Other (See Comments)   Unknown childhood reaction Has patient had a PCN reaction causing immediate rash, facial/tongue/throat swelling, SOB or lightheadedness with hypotension: Unknown Has patient had a PCN reaction causing severe rash involving mucus membranes or skin necrosis: Unknown Has patient had a PCN reaction that required hospitalization: Unknown Has patient had a PCN reaction occurring within the last 10 years: No If all of the above answers are NO, then may proceed with Cephalosporin use.   Dapagliflozin  Pro-metformin  Er Diarrhea, Other (See Comments)   Metformin  Hcl Diarrhea, Other (See Comments)        Medication List     STOP taking these medications    azithromycin 250 MG tablet Commonly known as: ZITHROMAX   blood glucose meter kit and supplies Kit  Olmesartan -amLODIPine -HCTZ 40-10-25 MG Tabs   spironolactone  100 MG tablet Commonly known as: ALDACTONE        TAKE these medications    Airsupra 90-80 MCG/ACT Aero Generic drug: Albuterol -Budesonide  Inhale 2 puffs into the lungs 4  (four) times daily.   amLODipine  10 MG tablet Commonly known as: NORVASC  Take 1 tablet (10 mg total) by mouth daily. Start taking on: July 14, 2023   furosemide  20 MG tablet Commonly known as: LASIX  Take 1 tablet (20 mg total) by mouth daily. Start taking on: July 14, 2023   irbesartan  300 MG tablet Commonly known as: AVAPRO  Take 1 tablet (300 mg total) by mouth daily. Start taking on: July 14, 2023   isosorbide -hydrALAZINE  20-37.5 MG tablet Commonly known as: BIDIL  Take 2 tablets by mouth 2 (two) times daily.   Linzess  72 MCG capsule Generic drug: linaclotide  Take 72 mcg by mouth daily before breakfast.   Mounjaro  7.5 MG/0.5ML Pen Generic drug: tirzepatide  Inject 7.5 mg into the skin once a week.   rosuvastatin  5 MG tablet Commonly known as: Crestor  Take 1 tablet (5 mg total) by mouth daily.   Singulair 10 MG tablet Generic drug: montelukast Take 10 mg by mouth daily.   VITAMIN D PO Take 1 tablet by mouth daily. *pt doesn't know units*        Follow-up Information     [facility] Follow up on 07/21/2023.   Specialty: Internal Medicine Why: 9:20 for hospital follow up Contact information: [address] [place]  [address] [phone]        Raford Riggs, MD. Schedule an appointment as soon as possible for a visit.   Specialty: Cardiology Why: Hospital follow up Contact information: [address] [place] [address] [phone]                Discharge Exam: Fredricka Weights   07/11/23 0533 07/12/23 0429 07/13/23 0325  Weight: (!) 155 kg (!) 155.6 kg (!) 156.9 kg   General exam: Awake, laying in bed, in nad Respiratory system: Normal respiratory effort, no wheezing Cardiovascular system: regular rate, s1, s2 Gastrointestinal system: Soft, nondistended, positive BS Central nervous system: CN2-12 grossly intact, strength intact Extremities: Perfused, no clubbing Skin: Normal skin  turgor, no notable skin lesions seen Psychiatry: Mood normal // no visual hallucinations   Condition at discharge: fair  The results of significant diagnostics from this hospitalization (including imaging, microbiology, ancillary and laboratory) are listed below for reference.   Imaging Studies: CT CHEST WO CONTRAST Result Date: 07/09/2023 CLINICAL DATA:  Shortness of breath. EXAM: CT CHEST WITHOUT CONTRAST TECHNIQUE: Multidetector CT imaging of the chest was performed following the standard protocol without IV contrast. RADIATION DOSE REDUCTION: This exam was performed according to the departmental dose-optimization program which includes automated exposure control, adjustment of the mA and/or kV according to patient size and/or use of iterative reconstruction technique. COMPARISON:  Chest CT dated 07/02/2019. Radiograph dated 07/08/2023. FINDINGS: Evaluation of this exam is limited in the absence of intravenous contrast. Cardiovascular: There is no cardiomegaly or pericardial effusion. There is coronary vascular calcification of the LAD. Mild atherosclerotic calcification of the thoracic aorta. No aneurysmal dilatation. The central pulmonary arteries are grossly unremarkable on this noncontrast CT. Mediastinum/Nodes: No hilar or mediastinal adenopathy. The esophagus is grossly unremarkable. No mediastinal fluid collection. Lungs/Pleura: Faint diffuse ground-glass density in the left lung as well as additional scattered clusters of ground-glass density in the right lower and right middle lobes suspicious for  atypical infiltrate. Clinical correlation is recommended. No consolidative changes. There is no pleural effusion or pneumothorax. The central airways are patent. Upper Abdomen: Gallstone. Musculoskeletal: Degenerative changes of the spine. No acute osseous pathology. IMPRESSION: 1. Findings suspicious for atypical infiltrate. 2. Cholelithiasis. 3.  Aortic Atherosclerosis (ICD10-I70.0). Electronically  Signed   By: Vanetta Chou M.D.   On: 07/09/2023 18:07   VAS US  LOWER EXTREMITY VENOUS (DVT) Result Date: 07/09/2023  Lower Venous DVT Study Patient Name:  RILEE KNOLL  Date of Exam:   07/09/2023 Medical Rec #: [id number]        Accession #:    [id number] Date of Birth: 11-Nov-1965        Patient Gender: F Patient Age:   34 years Exam Location:  [facility] Procedure:      VAS US  LOWER EXTREMITY VENOUS (DVT) Referring Phys: EKTA PATEL --------------------------------------------------------------------------------  Indications: Swelling, and SOB.  Risk Factors: Obesity. Limitations: Body habitus and poor ultrasound/tissue interface. Comparison Study: No recent priors. Performing Technologist: Ricka Holland RDMS, RVT  Examination Guidelines: A complete evaluation includes B-mode imaging, spectral Doppler, color Doppler, and power Doppler as needed of all accessible portions of each vessel. Bilateral testing is considered an integral part of a complete examination. Limited examinations for reoccurring indications may be performed as noted. The reflux portion of the exam is performed with the patient in reverse Trendelenburg.  +---------+---------------+---------+-----------+----------+--------------+ RIGHT    CompressibilityPhasicitySpontaneityPropertiesThrombus Aging +---------+---------------+---------+-----------+----------+--------------+ CFV      Full           Yes      Yes                                 +---------+---------------+---------+-----------+----------+--------------+ SFJ      Full                                                        +---------+---------------+---------+-----------+----------+--------------+ FV Prox  Full                                                        +---------+---------------+---------+-----------+----------+--------------+ FV Mid   Full                                                         +---------+---------------+---------+-----------+----------+--------------+ FV DistalFull                                                        +---------+---------------+---------+-----------+----------+--------------+ PFV      Full                                                        +---------+---------------+---------+-----------+----------+--------------+  POP      Full           Yes      Yes                                 +---------+---------------+---------+-----------+----------+--------------+ PTV      Full                                                        +---------+---------------+---------+-----------+----------+--------------+ PERO     Full                                                        +---------+---------------+---------+-----------+----------+--------------+   +---------+---------------+---------+-----------+----------+--------------+ LEFT     CompressibilityPhasicitySpontaneityPropertiesThrombus Aging +---------+---------------+---------+-----------+----------+--------------+ CFV      Full           Yes      Yes                                 +---------+---------------+---------+-----------+----------+--------------+ SFJ      Full                                                        +---------+---------------+---------+-----------+----------+--------------+ FV Prox  Full                                                        +---------+---------------+---------+-----------+----------+--------------+ FV Mid   Full                                                        +---------+---------------+---------+-----------+----------+--------------+ FV DistalFull                                                        +---------+---------------+---------+-----------+----------+--------------+ PFV      Full                                                         +---------+---------------+---------+-----------+----------+--------------+ POP      Full           Yes      Yes                                 +---------+---------------+---------+-----------+----------+--------------+  PTV      Full                                                        +---------+---------------+---------+-----------+----------+--------------+ PERO     Full                                                        +---------+---------------+---------+-----------+----------+--------------+     Summary: BILATERAL: - No evidence of deep vein thrombosis seen in the lower extremities, bilaterally. -No evidence of popliteal cyst, bilaterally.   *See table(s) above for measurements and observations. Electronically signed by Gaile New MD on 07/09/2023 at 5:57:33 PM.    Final    ECHOCARDIOGRAM COMPLETE Result Date: 07/09/2023    ECHOCARDIOGRAM REPORT   Patient Name:   COLUMBIA PANDEY Date of Exam: 07/09/2023 Medical Rec #:  [id number]       Height:       69.0 in Accession #:    [id number]      Weight:       338.4 lb Date of Birth:  [birth year]       BSA:          2.583 m Patient Age:    57 years        BP:           188/80 mmHg Patient Gender: F               HR:           67 bpm. Exam Location:  Inpatient Procedure: 2D Echo, Cardiac Doppler and Color Doppler Indications:    CHF-Acute Systolic I50.21  History:        Patient has prior history of Echocardiogram examinations, most                 recent 01/19/2018. Risk Factors:Diabetes and Hypertension.  Sonographer:    Jayson Gaskins Referring Phys: [phone] EKTA V PATEL IMPRESSIONS  1. Left ventricular ejection fraction, by estimation, is 70 to 75%. The left ventricle has hyperdynamic function. The left ventricle has no regional wall motion abnormalities. There is moderate concentric left ventricular hypertrophy. Left ventricular diastolic parameters are indeterminate.  2. Right ventricular systolic function is normal. The right ventricular  size is normal.  3. Mild mitral valve regurgitation.  4. The aortic valve is tricuspid. Aortic valve regurgitation is not visualized. FINDINGS  Left Ventricle: Left ventricular ejection fraction, by estimation, is 70 to 75%. The left ventricle has hyperdynamic function. The left ventricle has no regional wall motion abnormalities. The left ventricular internal cavity size was normal in size. There is moderate concentric left ventricular hypertrophy. Left ventricular diastolic parameters are indeterminate. Right Ventricle: The right ventricular size is normal. Right vetricular wall thickness was not assessed. Right ventricular systolic function is normal. Left Atrium: Left atrial size was normal in size. Right Atrium: Right atrial size was normal in size. Pericardium: There is no evidence of pericardial effusion. Mitral Valve: There is mild thickening of the mitral valve leaflet(s). Mild mitral valve regurgitation. Tricuspid Valve: The tricuspid valve is normal in structure. Tricuspid valve regurgitation is mild. Aortic Valve: The aortic valve is  tricuspid. Aortic valve regurgitation is not visualized. Aortic valve mean gradient measures 8.0 mmHg. Aortic valve peak gradient measures 11.7 mmHg. Aortic valve area, by VTI measures 2.98 cm. Pulmonic Valve: The pulmonic valve was not well visualized. Pulmonic valve regurgitation is not visualized. No evidence of pulmonic stenosis. Aorta: The aortic root is normal in size and structure. IAS/Shunts: No atrial level shunt detected by color flow Doppler.  LEFT VENTRICLE PLAX 2D LVIDd:         5.30 cm   Diastology LVIDs:         3.20 cm   LV e' medial:    5.44 cm/s LV PW:         1.60 cm   LV E/e' medial:  18.3 LV IVS:        1.60 cm   LV e' lateral:   3.70 cm/s LVOT diam:     2.10 cm   LV E/e' lateral: 27.0 LV SV:         111 LV SV Index:   43 LVOT Area:     3.46 cm  RIGHT VENTRICLE RV S prime:     18.80 cm/s TAPSE (M-mode): 2.6 cm LEFT ATRIUM             Index        RIGHT  ATRIUM           Index LA Vol (A2C):   84.6 ml 32.75 ml/m  RA Area:     16.60 cm LA Vol (A4C):   41.6 ml 16.10 ml/m  RA Volume:   39.90 ml  15.44 ml/m LA Biplane Vol: 60.3 ml 23.34 ml/m  AORTIC VALVE AV Area (Vmax):    2.86 cm AV Area (Vmean):   2.75 cm AV Area (VTI):     2.98 cm AV Vmax:           171.00 cm/s AV Vmean:          136.000 cm/s AV VTI:            0.373 m AV Peak Grad:      11.7 mmHg AV Mean Grad:      8.0 mmHg LVOT Vmax:         141.00 cm/s LVOT Vmean:        108.000 cm/s LVOT VTI:          0.321 m LVOT/AV VTI ratio: 0.86  AORTA Ao Root diam: 3.10 cm MITRAL VALVE MV Area (PHT): 2.64 cm    SHUNTS MV Decel Time: 287 msec    Systemic VTI:  0.32 m MV E velocity: 99.80 cm/s  Systemic Diam: 2.10 cm MV A velocity: 84.30 cm/s MV E/A ratio:  1.18 Vina Gull MD Electronically signed by Vina Gull MD Signature Date/Time: 07/09/2023/4:26:54 PM    Final    DG Chest Portable 1 View Result Date: 07/08/2023 CLINICAL DATA:  Shortness of breath. Flu-like symptoms for the past 3 months. EXAM: PORTABLE CHEST 1 VIEW COMPARISON:  Chest x-ray dated November 28, 2023. FINDINGS: Unchanged mild cardiomegaly. Normal pulmonary vascularity. No focal consolidation, pleural effusion, or pneumothorax. No acute osseous abnormality. IMPRESSION: 1. No active disease. Electronically Signed   By: Elsie ONEIDA Shoulder M.D.   On: 07/08/2023 15:19   DG Chest 2 View Result Date: 07/02/2023 CLINICAL DATA:  Worsening cough and shortness of breath. EXAM: CHEST - 2 VIEW COMPARISON:  04/25/2021 FINDINGS: The heart size and mediastinal contours are within normal limits. Both lungs are clear. The visualized skeletal structures are  unremarkable. IMPRESSION: No active cardiopulmonary disease. Electronically Signed   By: Norleen DELENA Kil M.D.   On: 07/02/2023 09:45    Microbiology: Results for orders placed or performed during the hospital encounter of 07/08/23  Resp panel by RT-PCR (RSV, Flu A&B, Covid) Anterior Nasal Swab     Status: None    Collection Time: 07/08/23  1:48 PM   Specimen: Anterior Nasal Swab  Result Value Ref Range Status   SARS Coronavirus 2 by RT PCR NEGATIVE NEGATIVE Final    Comment: (NOTE) SARS-CoV-2 target nucleic acids are NOT DETECTED.  The SARS-CoV-2 RNA is generally detectable in upper respiratory specimens during the acute phase of infection. The lowest concentration of SARS-CoV-2 viral copies this assay can detect is 138 copies/mL. A negative result does not preclude SARS-Cov-2 infection and should not be used as the sole basis for treatment or other patient management decisions. A negative result may occur with  improper specimen collection/handling, submission of specimen other than nasopharyngeal swab, presence of viral mutation(s) within the areas targeted by this assay, and inadequate number of viral copies(<138 copies/mL). A negative result must be combined with clinical observations, patient history, and epidemiological information. The expected result is Negative.  Fact Sheet for Patients:  bloggercourse.com  Fact Sheet for Healthcare Providers:  seriousbroker.it  This test is no t yet approved or cleared by the [place]  FDA and  has been authorized for detection and/or diagnosis of SARS-CoV-2 by FDA under an Emergency Use Authorization (EUA). This EUA will remain  in effect (meaning this test can be used) for the duration of the COVID-19 declaration under Section 564(b)(1) of the Act, 21 U.S.C.section 360bbb-3(b)(1), unless the authorization is terminated  or revoked sooner.       Influenza A by PCR NEGATIVE NEGATIVE Final   Influenza B by PCR NEGATIVE NEGATIVE Final    Comment: (NOTE) The Xpert Xpress SARS-CoV-2/FLU/RSV plus assay is intended as an aid in the diagnosis of influenza from Nasopharyngeal swab specimens and should not be used as a sole basis for treatment. Nasal washings and aspirates are unacceptable for  Xpert Xpress SARS-CoV-2/FLU/RSV testing.  Fact Sheet for Patients: bloggercourse.com  Fact Sheet for Healthcare Providers: seriousbroker.it  This test is not yet approved or cleared by the [place]  FDA and has been authorized for detection and/or diagnosis of SARS-CoV-2 by FDA under an Emergency Use Authorization (EUA). This EUA will remain in effect (meaning this test can be used) for the duration of the COVID-19 declaration under Section 564(b)(1) of the Act, 21 U.S.C. section 360bbb-3(b)(1), unless the authorization is terminated or revoked.     Resp Syncytial Virus by PCR NEGATIVE NEGATIVE Final    Comment: (NOTE) Fact Sheet for Patients: bloggercourse.com  Fact Sheet for Healthcare Providers: seriousbroker.it  This test is not yet approved or cleared by the [place]  FDA and has been authorized for detection and/or diagnosis of SARS-CoV-2 by FDA under an Emergency Use Authorization (EUA). This EUA will remain in effect (meaning this test can be used) for the duration of the COVID-19 declaration under Section 564(b)(1) of the Act, 21 U.S.C. section 360bbb-3(b)(1), unless the authorization is terminated or revoked.  Performed at [facility], [address], [place], [place] [address]     Labs: CBC: Recent Labs  Lab 07/08/23 1330 07/12/23 0238  WBC 6.8 8.7  NEUTROABS 4.3  --   HGB 12.7 12.0  HCT 39.1 36.7  MCV 89.7 89.7  PLT 167 234   Basic Metabolic Panel:  Recent Labs  Lab 07/08/23 1330 07/09/23 1402 07/10/23 0828 07/11/23 0229 07/12/23 0238 07/13/23 0250  NA 141  --  141 138 139 138  K 3.4*  --  3.3* 3.7 3.5 3.6  CL 105  --  104 102 103 103  CO2 28  --  26 27 24 24   GLUCOSE 178*  --  216* 146* 165* 176*  BUN 22*  --  35* 35* 26* 22*  CREATININE 1.47*  --  1.70* 1.55* 1.35* 1.42*  CALCIUM  9.5  --  9.4 9.2 9.8 9.6  MG   --  2.3  --   --   --   --    Liver Function Tests: Recent Labs  Lab 07/09/23 1402 07/12/23 0238 07/13/23 0250  AST 22 17 14*  ALT 17 15 16   ALKPHOS 65 62 66  BILITOT 0.5 0.3 0.4  PROT 6.8 5.8* 6.1*  ALBUMIN 3.2* 2.9* 3.0*   CBG: Recent Labs  Lab 07/12/23 1612 07/12/23 2102 07/13/23 0618 07/13/23 1046 07/13/23 1500  GLUCAP 157* 205* 144* 190* 175*    Discharge time spent: less than 30 minutes.  Signed: Garnette Pelt, MD [facility] 07/13/2023

## 2023-07-13 NOTE — Plan of Care (Signed)
  Problem: Health Behavior/Discharge Planning: Goal: Ability to manage health-related needs will improve Outcome: Progressing   Problem: Clinical Measurements: Goal: Will remain free from infection Outcome: Progressing Goal: Respiratory complications will improve Outcome: Progressing Goal: Cardiovascular complication will be avoided Outcome: Progressing   Problem: Activity: Goal: Risk for activity intolerance will decrease Outcome: Progressing

## 2023-07-13 NOTE — Progress Notes (Signed)
 Patient refuses telemetry. Dr. Joan Mouton made aware of.

## 2023-07-13 NOTE — Telephone Encounter (Signed)
 Primary services have reached out asking for this patient to be seen by Dr. Theodis Fiscal for hypertension.  Can you please call patient and help schedule this?

## 2023-07-13 NOTE — Progress Notes (Signed)
 Mobility Specialist Progress Note:   07/13/23 1045  Mobility  Activity Ambulated independently in hallway  Level of Assistance Independent  Assistive Device None  Distance Ambulated (ft) 300 ft  Activity Response Tolerated well  Mobility Referral Yes  Mobility visit 1 Mobility  Mobility Specialist Start Time (ACUTE ONLY) 1045  Mobility Specialist Stop Time (ACUTE ONLY) 1100  Mobility Specialist Time Calculation (min) (ACUTE ONLY) 15 min   Pt agreeable to mobility session. Required no physical assistance throughout ambulation. Denies SOB, SpO2 WFL on RA. Pt back in bed with all needs met.  Therisa Rana Mobility Specialist Please contact via SecureChat or  Rehab office at [phone]

## 2023-07-17 ENCOUNTER — Telehealth (HOSPITAL_BASED_OUTPATIENT_CLINIC_OR_DEPARTMENT_OTHER): Payer: Self-pay

## 2023-07-17 ENCOUNTER — Ambulatory Visit (HOSPITAL_BASED_OUTPATIENT_CLINIC_OR_DEPARTMENT_OTHER): Payer: 59 | Admitting: Family

## 2023-07-17 ENCOUNTER — Encounter (HOSPITAL_BASED_OUTPATIENT_CLINIC_OR_DEPARTMENT_OTHER): Payer: Self-pay | Admitting: Family

## 2023-07-17 VITALS — BP 152/71 | HR 98 | Ht 69.0 in | Wt 351.0 lb

## 2023-07-17 DIAGNOSIS — E042 Nontoxic multinodular goiter: Secondary | ICD-10-CM

## 2023-07-17 DIAGNOSIS — G4730 Sleep apnea, unspecified: Secondary | ICD-10-CM

## 2023-07-17 DIAGNOSIS — R0683 Snoring: Secondary | ICD-10-CM

## 2023-07-17 DIAGNOSIS — I1A0 Resistant hypertension: Secondary | ICD-10-CM

## 2023-07-17 DIAGNOSIS — E1165 Type 2 diabetes mellitus with hyperglycemia: Secondary | ICD-10-CM | POA: Diagnosis not present

## 2023-07-17 DIAGNOSIS — Z6841 Body Mass Index (BMI) 40.0 and over, adult: Secondary | ICD-10-CM

## 2023-07-17 MED ORDER — OLMESARTAN-AMLODIPINE-HCTZ 40-10-25 MG PO TABS: 1.0000 | ORAL_TABLET | Freq: Every day | ORAL | 5 refills | Status: DC

## 2023-07-17 MED ORDER — ISOSORB DINITRATE-HYDRALAZINE 20-37.5 MG PO TABS: 2.0000 | ORAL_TABLET | Freq: Two times a day (BID) | ORAL | 3 refills | Status: DC

## 2023-07-17 NOTE — Progress Notes (Signed)
[facility] Initial Assessment:    Date:  07/17/2023   ID:  Jenna Stokes, DOB 1966/02/10, MRN [id number]  PCP:  Patrick Jupiter, NP  Cardiologist:  Kristeen Miss, MD  Nephrologist:  Referring MD: Jerald Kief, MD   CC: Hypertension  History of Present Illness:    Jenna Stokes is a 58 y.o. female with a hx of  history of morbid obesity, hypertension, DM2, CKD3b.,  Multinodular goiter, COPD. Here to establish with [facility].   Admitted 2/4-07/13/23 after presenting with shortness of breath.  Treated for hypertensive urgency.  CT scan chest unremarkable and normal WBC.  She was given Pulmicort nebulizer for COPD while inpatient.  Echo LVEF 70 to 75%, moderate LVH, RV normal, mild MR.  Due to pedal edema bilateral Doppler was negative for DVT.  Discharged on irbesartan, amlodipine, BiDil, Lasix.  Lasix added due to exertional dyspnea, lower extremity edema.  Jenna Stokes was diagnosed with hypertension in her 30s or 62s. It has been difficult to control. Blood pressure checked with arm cuff at home. Readings have been at the lowest 179. Her home cuff is found to read 10 points high today. she reports tobacco use never. For exercise she has no formal routine.  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  Has not yet returned to work after hospitalization.  Previously lost weight on Mounjaro.  PCP has provided 5 mg pen but has not resumed. Encouraged to do so.   Previous diagnosis of multinodular goiter previously seen  by Dr. Talmage Nap.  Ultrasound 04/22/2022 with borderline thyroid megaly suggestive of multinodular goiter with nodule #2 meeting imaging criteria for 1 year follow-up (nodule #2 left, inferior, 1.2 cm). 07/2022 parathyroid planer with no parathyroid adenoma.  Prior sleep study 2018 with an sleep apnea however notes poor sleep.  Often waking multiple times throughout the night. Has to get up at 4:30 am for work.  Notes  snoring and daytime somnolence.  Previous antihypertensives: Spironolactne Lisinopril Losartan Carvedilol chlorthalidone  Secondary Causes of Hypertension  Medications/Herbal: OCP, steroids, stimulants, antidepressants, weight loss medication, immune suppressants, NSAIDs, sympathomimetics, alcohol, caffeine, licorice, ginseng, St. John's wort, chemo  Sleep Apnea 2018 sleep study with no sleep apnea Renal artery stenosis 2019 no renal artery steniosis Hyperaldosteronism  Hyper/hypothyroidism 07/09/23 TSH ).527 (((followed by Dr. Talmage Nap Pheochromocytoma: CT 2022 normal adrenal Cushing's syndrome: Cushingoid facies, central obesity, proximal muscle weakness, and ecchymoses, adrenal incidentaloma (cortisol) Coarctation of the aorta  Past Medical History:  Diagnosis Date   AKI (acute kidney injury) (HCC) 12/16/2020   DM type 2, uncontrolled, with renal complications 09/20/2013   Hyperlipidemia    Hypertension    Morbid obesity (HCC)     Past Surgical History:  Procedure Laterality Date   ABDOMINAL HYSTERECTOMY     CESAREAN SECTION     INCISION AND DRAINAGE ABSCESS Left 12/20/2020   Procedure: INCISION AND DRAINAGE ABSCESS;  Surgeon: Chevis Pretty III, MD;  Location: [facility];  Service: General;  Laterality: Left;   TONSILLECTOMY      Current Medications: Current Meds  Medication Sig   AIRSUPRA 90-80 MCG/ACT AERO Inhale 2 puffs into the lungs 4 (four) times daily.   linaclotide (LINZESS) 72 MCG capsule Take 72 mcg by mouth daily before breakfast.   montelukast (SINGULAIR) 10 MG tablet Take 10 mg by mouth daily.   Olmesartan-amLODIPine-HCTZ 40-10-25 MG TABS Take 1 tablet by mouth daily.   rosuvastatin (CRESTOR) 5 MG tablet  Take 1 tablet (5 mg total) by mouth daily.   tirzepatide (MOUNJARO) 7.5 MG/0.5ML Pen Inject 7.5 mg into the skin once a week.   VITAMIN D PO Take 1 tablet by mouth daily. *pt doesn't know units*   [DISCONTINUED] amLODipine (NORVASC) 10 MG tablet Take 1 tablet (10  mg total) by mouth daily.   [DISCONTINUED] furosemide (LASIX) 20 MG tablet Take 1 tablet (20 mg total) by mouth daily.   [DISCONTINUED] irbesartan (AVAPRO) 300 MG tablet Take 1 tablet (300 mg total) by mouth daily.   [DISCONTINUED] isosorbide-hydrALAZINE (BIDIL) 20-37.5 MG tablet Take 2 tablets by mouth 2 (two) times daily.     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      Home Situation:       Spiritual Beliefs:       Lifestyle: no regular exercise; diet is so so            Social Drivers of Corporate investment banker Strain: Not on file  Food Insecurity: No Food Insecurity (07/09/2023)   Hunger Vital Sign    Worried About Running Out of Food in the Last Year: Never true    Ran Out of Food in the Last Year: Never true  Transportation Needs: No Transportation Needs (07/09/2023)   PRAPARE - Administrator, Civil Service (Medical): No    Lack of Transportation (Non-Medical): No  Physical Activity: Inactive (01/12/2018)   Exercise Vital Sign    Days of Exercise per Week: 0 days    Minutes of Exercise per Session: 0 min  Stress: No Stress Concern Present (01/12/2018)   [employer] of Occupational Health - Occupational Stress Questionnaire    Feeling of Stress : Only a little  Social Connections: Not on file     Family History: The patient's family history includes Adrenal disorder in her sister; Diabetes in her mother and sister;  Heart disease in her mother; Hypertension in her sister.  ROS:   Please see the history of present illness.     All other systems reviewed and are negative.  EKGs/Labs/Other Studies Reviewed:         Recent Labs: 07/08/2023: B Natriuretic Peptide 60.9 07/09/2023: Magnesium 2.3; TSH 0.527 07/12/2023: Hemoglobin 12.0; Platelets 234 07/13/2023: ALT 16; BUN 22; Creatinine, Ser 1.42; Potassium 3.6; Sodium 138   Recent Lipid Panel    Component Value Date/Time   CHOL 222 (H) 07/09/2023 1402   TRIG 175 (H) 07/09/2023 1402   HDL 44 07/09/2023 1402   CHOLHDL 5.0 07/09/2023 1402   VLDL 35 07/09/2023 1402   LDLCALC 143 (H) 07/09/2023 1402   LDLDIRECT 107.0 08/31/2021 1036    Physical Exam:   VS:  BP (!) 152/71   Pulse 98   Ht 5\' 9"  (1.753 m)   Wt (!) 351 lb (159.2 kg)   LMP  08/10/2006   SpO2 98%   BMI 51.83 kg/m  , BMI Body mass index is 51.83 kg/m. GENERAL:  Well appearing HEENT: Pupils equal round and reactive, fundi not visualized, oral mucosa unremarkable NECK:  No jugular venous distention, waveform within normal limits, carotid upstroke brisk and symmetric, no bruits, no thyromegaly LYMPHATICS:  No cervical adenopathy LUNGS:  Clear to auscultation bilaterally HEART:  RRR.  PMI not displaced or sustained,S1 and S2 within normal limits, no S3, no S4, no clicks, no rubs, no murmurs ABD:  Flat, positive bowel sounds normal in frequency in pitch, no bruits, no rebound, no guarding, no midline pulsatile mass, no hepatomegaly, no splenomegaly EXT:  2 plus pulses throughout, no edema, no cyanosis no clubbing SKIN:  No rashes no nodules NEURO:  Cranial nerves II through XII grossly intact, motor grossly intact throughout PSYCH:  Cognitively intact, oriented to person place and time   ASSESSMENT/PLAN:    HTN - BP not at goal <130/80. She is understandably frustrated by multiple tablets. Home BP cuff found to read 10 points high.  STOP Irbesartan, Amlodipine, Lasix START  Olmesartan-Amlodipine-hydrochlorothiazide 40-10-25mg  daily Mychart message check in 1-2 weeks to reassess BP control.  Continue Bidil 20-37.5mg  two tablets BID Labs today: BMP, renin-aldosterone.  If positive for hyperaldosteronism will plan to add spironolactone.  Could also consider in the future regardless of results as she reports this previously controlled her BP well.  Unclear why it was discontinued previously. Post clinic she informed the nurse she was not comfortable returning to work based on BP. Will set up for nurse visit next week to reassess BP to minimize time out of work  Snores / Sleep disordered breathing - STOPBang 6. Plan for [facility] home sleep study.   DM2 / Obesity - 07/09/23 A1c 7.0. Encouraged to start [facility] as prescribed by PCP.  Has had previous success with weight loss on Mounjaro.Weight loss via diet and exercise encouraged. Discussed the impact being overweight would have on cardiovascular risk.   Multinodular goiter - Normal TSH 07/09/23. Prior thyroid US 04/2022 recommended for repeat in 1 year. Has not required thyroid medication. Previously followed with Dr. Talmage Nap. Recommend discussion of repeat thyroid US for monitoring with PCP or Dr. Talmage Nap.  Screening for Secondary Hypertension:     07/17/2023   12:48 PM  Causes  Renovascular HTN Screened  Sleep Apnea Screened     - Comments 07/2023 [facility] ordered  Thyroid Disease Screened     - Comments 07/09/23 normal TSH, known multinodular goiter  Hyperaldosteronism Screened     - Comments 07/17/23 renin-aldo collected  Pheochromocytoma N/A     - Comments normal adrenal by CT  Compliance Screened     - Comments noted prior nonadherence, notes taking regularly now    Relevant Labs/Studies:    Latest Ref Rng & Units 07/13/2023    2:50 AM 07/12/2023    2:38 AM 07/11/2023    2:29 AM  Basic Labs  Sodium 135 - 145 mmol/L 138  139  138   Potassium 3.5 - 5.1 mmol/L 3.6  3.5  3.7   Creatinine 0.44 - 1.00 mg/dL 1.61  0.96  0.45         Latest Ref Rng & Units 07/09/2023    2:02 PM 06/09/2019   12:42 PM  Thyroid   TSH 0.350 - 4.500 uIU/mL 0.527  1.24        Latest Ref Rng & Units 01/12/2018    3:27 AM 01/09/2018   12:56  PM  Renin/Aldosterone   Aldosterone 0.0 - 30.0 ng/dL 4.6  21.3              02/10/2018    9:50 AM  Renovascular   Renal Artery [place] Completed Yes       Disposition:    FU with Dr. Duke Salvia in [facility]    Medication Adjustments/Labs and Tests Ordered: Current medicines are reviewed at length with the patient today.  Concerns regarding medicines are outlined above.  Orders Placed This Encounter  Procedures   Basic metabolic panel   Aldosterone + renin activity w/ ratio   Ambulatory referral to Nutrition and Diabetic Education   [facility] Sleep Study   Meds ordered this encounter  Medications   Olmesartan-amLODIPine-HCTZ 40-10-25 MG TABS    Sig: Take 1 tablet by mouth daily.    Dispense:  30 tablet    Refill:  5   isosorbide-hydrALAZINE (BIDIL) 20-37.5 MG tablet    Sig: Take 2 tablets by mouth 2 (two) times daily.    Dispense:  360 tablet    Refill:  3     Signed, Alver Sorrow, NP  07/17/2023 12:49 PM    [facility]

## 2023-07-17 NOTE — Telephone Encounter (Signed)
-----   Message from Alver Sorrow sent at 07/17/2023 12:45 PM EST ----- If she does not return to work, will need nurse visit next week (Wed, Thurs, or Fri) to reassess BP control.   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 Told her I would check with you and message her this afternoon

## 2023-07-17 NOTE — Patient Instructions (Addendum)
Medication Instructions:   STOP Irbesartan STOP Amlodipine STOP Furosemide (Lasix)  START Olmesartan-Amlodipine-hydrochlorothiazide 40-10-25mg  daily  CONTINUE Bidil two tablets twice daily   Labwork: Your physician recommends that you return for lab work today: BMP, renin-aldosterone    Follow-Up: 3 months with Dr. Duke Salvia in [facility]     Referrals:  Home sleep study- We will contact you with the PIN number once it has been approved  WatchPAT?  Is a FDA cleared portable home sleep study test that uses a watch and 3 points of contact to monitor 7 different channels, including your heart rate, oxygen saturations, body position, snoring, and chest motion.  The study is easy to use from the comfort of your own home and accurately detect sleep apnea.  Before bed, you attach the chest sensor, attached the sleep apnea bracelet to your nondominant hand, and attach the finger probe.  After the study, the raw data is downloaded from the watch and scored for apnea events.   For more information: [email or web address]  Patient Testing Instructions:  Do not put battery into the device until bedtime when you are ready to begin the test. Please call the support number if you need assistance after following the instructions below: 24 hour support line- [phone] or [facility] support at [phone] (option 2)  Download the IntelWatchPAT One" app through the google play store or App Store  Be sure to turn on or enable access to bluetooth in settlings on your smartphone/ device  Make sure no other bluetooth devices are on and within the vicinity of your smartphone/ device and WatchPAT watch during testing.  Make sure to leave your smart phone/ device plugged in and charging all night.  When ready for bed:  Follow the instructions step by step in the WatchPAT One App to activate the testing device. For additional instructions, including video instruction, visit the WatchPAT  One video on Youtube. You can search for WatchPat One within Youtube (video is 4 minutes and 18 seconds) or enter: https://youtube/[email or web address]?[email or web address]_vbiwxE Please note: You will be prompted to enter a Pin to connect via bluetooth when starting the test. The PIN will be assigned to you when you receive the test.  The device is disposable, but it recommended that you retain the device until you receive a call letting you know the study has been received and the results have been interpreted.  We will let you know if the study did not transmit to [place] properly after the test is completed. You do not need to call us to confirm the receipt of the test.  Please complete the test within 48 hours of receiving PIN.   Frequently Asked Questions:  What is Watch Dennie Bible one?  A single use fully disposable home sleep apnea testing device and will not need to be returned after completion.  What are the requirements to use WatchPAT one?  The be able to have a successful watchpat one sleep study, you should have your Watch pat one device, your smart phone, watch pat one app, your PIN number and Internet access What type of phone do I need?  You should have a smart phone that uses Android 5.1 and above or any Iphone with IOS 10 and above How can I download the WatchPAT one app?  Based on your device type search for WatchPAT one app either in google play for android devices or APP store for Iphone's Where will I get my PIN for the study?  Your PIN will be provided  by your physician's office. It is used for authentication and if you lose/forget your PIN, please reach out to your providers office.  I do not have Internet at home. Can I do WatchPAT one study?  WatchPAT One needs Internet connection throughout the night to be able to transmit the sleep data. You can use your home/local internet or your cellular's data package. However, it is always recommended to use home/local Internet. It is estimated that between 20MB-30MB will  be used with each study.However, the application will be looking for space in the phone to start the study.  What happens if I lose internet or bluetooth connection?  During the internet disconnection, your phone will not be able to transmit the sleep data. All the data, will be stored in your phone. As soon as the internet connection is back on, the phone will being sending the sleep data. During the bluetooth disconnection, WatchPAT one will not be able to to send the sleep data to your phone. Data will be kept in the [facility] one until two devices have bluetooth connection back on. As soon as the connection is back on, WatchPAT one will send the sleep data to the phone.  How long do I need to wear the WatchPAT one?  After you start the study, you should wear the device at least 6 hours.  How far should I keep my phone from the device?  During the night, your phone should be within 15 feet.  What happens if I leave the room for restroom or other reasons?  Leaving the room for any reason will not cause any problem. As soon as your get back to the room, both devices will reconnect and will continue to send the sleep data. Can I use my phone during the sleep study?  Yes, you can use your phone as usual during the study. But it is recommended to put your watchpat one on when you are ready to go to bed.  How will I get my study results?  A soon as you completed your study, your sleep data will be sent to the provider. They will then share the results with you when they are ready. ]

## 2023-07-21 ENCOUNTER — Encounter (HOSPITAL_BASED_OUTPATIENT_CLINIC_OR_DEPARTMENT_OTHER): Payer: Self-pay

## 2023-07-21 ENCOUNTER — Ambulatory Visit (INDEPENDENT_AMBULATORY_CARE_PROVIDER_SITE_OTHER): Payer: 59 | Admitting: Nurse Practitioner

## 2023-07-21 ENCOUNTER — Encounter (HOSPITAL_BASED_OUTPATIENT_CLINIC_OR_DEPARTMENT_OTHER): Payer: 59 | Admitting: Certified Nurse Midwife

## 2023-07-21 ENCOUNTER — Encounter: Payer: Self-pay | Admitting: Nurse Practitioner

## 2023-07-21 VITALS — BP 141/66 | HR 71 | Temp 97.5°F | Ht 70.0 in | Wt 347.0 lb

## 2023-07-21 DIAGNOSIS — E1169 Type 2 diabetes mellitus with other specified complication: Secondary | ICD-10-CM

## 2023-07-21 DIAGNOSIS — J45909 Unspecified asthma, uncomplicated: Secondary | ICD-10-CM | POA: Insufficient documentation

## 2023-07-21 DIAGNOSIS — I1A0 Resistant hypertension: Secondary | ICD-10-CM | POA: Diagnosis not present

## 2023-07-21 DIAGNOSIS — N1832 Chronic kidney disease, stage 3b: Secondary | ICD-10-CM

## 2023-07-21 DIAGNOSIS — Z1231 Encounter for screening mammogram for malignant neoplasm of breast: Secondary | ICD-10-CM

## 2023-07-21 DIAGNOSIS — E785 Hyperlipidemia, unspecified: Secondary | ICD-10-CM

## 2023-07-21 DIAGNOSIS — R0602 Shortness of breath: Secondary | ICD-10-CM

## 2023-07-21 MED ORDER — TIRZEPATIDE 2.5 MG/0.5ML ~~LOC~~ SOAJ: 2.5000 mg | SUBCUTANEOUS | 0 refills | Status: DC

## 2023-07-21 NOTE — Assessment & Plan Note (Signed)
Continue singular 10 mg daily, as supra 90-80 mcg/ACT inhaler 2 puffs 4 times daily as needed

## 2023-07-21 NOTE — Assessment & Plan Note (Signed)
Continue isosorbide-hydralazine 20-37.5 mg twice daily Encouraged to pick up prescription for olmesartan-amlodipine-hydrochlorothiazide, 40 - 10 - 25 mg 1 tablet daily and take medication as ordered Blood pressure goal is less than 130/80 Patient encouraged to continue to monitor blood pressure at home DASH diet and commitment to daily physical activity for a minimum of 30 minutes discussed and encouraged, as a part of hypertension management. The importance of attaining a healthy weight is also discussed.     07/21/2023    9:45 AM 07/21/2023    9:30 AM 07/17/2023    8:44 AM 07/13/2023    2:59 PM 07/13/2023   12:51 PM 07/13/2023   10:41 AM 07/13/2023    8:05 AM  BP/Weight  Systolic BP 141 159 152 169 157 175 197  Diastolic BP 66 67 71 76 71 77 85  Wt. (Lbs)  347 351      BMI  49.79 kg/m2 51.83 kg/m2

## 2023-07-21 NOTE — Progress Notes (Signed)
New Patient Office Visit  Subjective:  Patient ID: Jenna Stokes, female    DOB: Jan 30, 1966  Age: 58 y.o. MRN: [id number]  CC:  Chief Complaint  Patient presents with   Hospitalization Follow-up    HPI Jenna Stokes is a 58 y.o. 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  Patient presents for establishing care for her chronic medical conditions and for hospital discharge follow up . Previous PCP [facility].   Patient was on admission at the hospital from 07/08/2023 to 07/13/2023 for SOBOE, hypertensive urgency.  Echocardiogram showed left ventricular ejection fraction of 70 to 75% moderate left ventricular hypertrophy, she has since established care with cardiology  Hypertension.  Currently on isosorbide-hydralazine 20-37.5 mg 2 tablets twice daily,.  Cardiology recently started her on olmesartan-amlodipine-H CTZ 40 - 10 - 25 mg 1 tablet twice daily but she has not picked up this prescription.  Taking amlodipine 10 mg, furosemide 20 mg and irbesartan 300 mg daily that she still has not.  Plans on picking up new prescription this week.  Stated that her shortness of breath and dizziness  are better.  Uncontrolled type 2 diabetes.  Stated that she was on [facility] for about 8 months, ran out of the medication since October 2024.  Takes rosuvastatin 5 mg daily for hyperlipidemia  Asthma.  Takes airsupra 90-80 mcg/act , 2 puffs into the lungs 4 times daily as needed, singular 10 mg daily.  Obesity.  Stated that she was going through a weight management clinic in the past, they had talked to her about surgery but she was not interested in surgery.  She would like to have the excess fat on her abdomen surgically removed, still not interested in weight loss surgery.  Stated she is on a low-carb diet does she aerobics exercises  Stated that  she had labs done at the cardiology office, lab results are pending       Past Medical History:  Diagnosis Date   AKI (acute kidney injury) (HCC) 12/16/2020   DM type 2, uncontrolled, with renal complications 09/20/2013   Hyperlipidemia    Hypertension    Morbid obesity (HCC)     Past Surgical History:  Procedure Laterality Date   ABDOMINAL HYSTERECTOMY     CESAREAN SECTION     INCISION AND DRAINAGE ABSCESS Left 12/20/2020   Procedure: INCISION AND DRAINAGE ABSCESS;  Surgeon: Griselda Miner, MD;  Location: [facility];  Service: General;  Laterality: Left;   TONSILLECTOMY      Family History  Problem Relation Age of Onset   Heart disease Mother    Diabetes Mother    Hypertension Sister    Diabetes Sister    Adrenal disorder Sister     Social History   Socioeconomic History   Marital status: Single    Spouse name: Not on file   Number of children: 1   Years of education: Not on file   Highest education level: Not on file  Occupational History   Not on file  Tobacco Use   Smoking status: Never   Smokeless tobacco: Never  Substance and Sexual Activity   Alcohol use: No   Drug use: No   Sexual activity: Yes  Other Topics Concern   Not on file  Social History Narrative   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  Home Situation:       Spiritual Beliefs:       Lifestyle: no regular exercise; diet is so so            Social Drivers of Corporate investment banker Strain: Not on file  Food Insecurity: No Food Insecurity (07/09/2023)   Hunger Vital Sign    Worried About Running Out of Food in the Last Year: Never true    Ran Out of Food in the Last Year: Never true  Transportation Needs: No Transportation Needs (07/09/2023)   PRAPARE - Administrator, Civil Service (Medical): No    Lack of Transportation (Non-Medical): No  Physical Activity: Inactive (01/12/2018)   Exercise Vital Sign    Days of Exercise per Week: 0 days    Minutes of Exercise  per Session: 0 min  Stress: No Stress Concern Present (01/12/2018)   [employer] of Occupational Health - Occupational Stress Questionnaire    Feeling of Stress : Only a little  Social Connections: Not on file  Intimate Partner Violence: Not At Risk (07/09/2023)   Humiliation, Afraid, Rape, and Kick questionnaire    Fear of Current or Ex-Partner: No    Emotionally Abused: No    Physically Abused: No    Sexually Abused: No    ROS Review of Systems  Constitutional:  Negative for appetite change, chills, fatigue and fever.  HENT:  Negative for congestion, postnasal drip, rhinorrhea and sneezing.   Respiratory:  Positive for shortness of breath. Negative for cough and wheezing.   Cardiovascular:  Negative for chest pain, palpitations and leg swelling.  Gastrointestinal:  Negative for abdominal pain, constipation, nausea and vomiting.  Genitourinary:  Negative for difficulty urinating, dysuria, flank pain and frequency.  Musculoskeletal:  Negative for arthralgias, back pain, joint swelling and myalgias.  Skin:  Negative for color change, pallor, rash and wound.  Neurological:  Positive for dizziness. Negative for facial asymmetry, weakness, numbness and headaches.  Psychiatric/Behavioral:  Negative for behavioral problems, confusion, self-injury and suicidal ideas.     Objective:   Today's Vitals: BP (!) 141/66   Pulse 71   Temp (!) 97.5 F (36.4 C)   Ht 5\' 10"  (1.778 m)   Wt (!) 347 lb (157.4 kg)   LMP 08/10/2006   SpO2 99%   BMI 49.79 kg/m   Physical Exam Vitals and nursing note reviewed.  Constitutional:      General: She is not in acute distress.    Appearance: Normal appearance. She is obese. She is not ill-appearing, toxic-appearing or diaphoretic.  HENT:     Mouth/Throat:     Mouth: Mucous membranes are moist.     Pharynx: Oropharynx is clear. No oropharyngeal exudate or posterior oropharyngeal erythema.  Eyes:     General: No scleral icterus.       Right eye:  No discharge.        Left eye: No discharge.     Extraocular Movements: Extraocular movements intact.     Conjunctiva/sclera: Conjunctivae normal.  Cardiovascular:     Rate and Rhythm: Normal rate and regular rhythm.     Pulses: Normal pulses.     Heart sounds: Normal heart sounds. No murmur heard.    No friction rub. No gallop.  Pulmonary:     Effort: Pulmonary effort is normal. No respiratory distress.     Breath sounds: Normal breath sounds. No stridor. No wheezing, rhonchi or rales.  Chest:     Chest wall: No  tenderness.  Abdominal:     General: There is no distension.     Palpations: Abdomen is soft.     Tenderness: There is no abdominal tenderness. There is no right CVA tenderness, left CVA tenderness or guarding.  Musculoskeletal:        General: No swelling, tenderness, deformity or signs of injury.     Right lower leg: No edema.     Left lower leg: No edema.  Skin:    General: Skin is warm and dry.     Capillary Refill: Capillary refill takes less than 2 seconds.     Coloration: Skin is not jaundiced or pale.     Findings: No bruising, erythema or lesion.  Neurological:     Mental Status: She is alert and oriented to person, place, and time.     Motor: No weakness.     Coordination: Coordination normal.     Gait: Gait normal.  Psychiatric:        Mood and Affect: Mood normal.        Behavior: Behavior normal.        Thought Content: Thought content normal.        Judgment: Judgment normal.     Assessment & Plan:   Problem List Items Addressed This Visit       Cardiovascular and Mediastinum   Resistant hypertension   Continue isosorbide-hydralazine 20-37.5 mg twice daily Encouraged to pick up prescription for olmesartan-amlodipine-hydrochlorothiazide, 40 - 10 - 25 mg 1 tablet daily and take medication as ordered Blood pressure goal is less than 130/80 Patient encouraged to continue to monitor blood pressure at home DASH diet and commitment to daily physical  activity for a minimum of 30 minutes discussed and encouraged, as a part of hypertension management. The importance of attaining a healthy weight is also discussed.     07/21/2023    9:45 AM 07/21/2023    9:30 AM 07/17/2023    8:44 AM 07/13/2023    2:59 PM 07/13/2023   12:51 PM 07/13/2023   10:41 AM 07/13/2023    8:05 AM  BP/Weight  Systolic BP 141 159 152 169 157 175 197  Diastolic BP 66 67 71 76 71 77 85  Wt. (Lbs)  347 351      BMI  49.79 kg/m2 51.83 kg/m2                 Respiratory   Asthma   Continue singular 10 mg daily, as supra 90-80 mcg/ACT inhaler 2 puffs 4 times daily as needed        Endocrine   Hyperlipidemia associated with type 2 diabetes mellitus (HCC)   Lab Results  Component Value Date   HGBA1C 7.0 (H) 07/09/2023     Lab Results  Component Value Date   CHOL 222 (H) 07/09/2023   HDL 44 07/09/2023   LDLCALC 143 (H) 07/09/2023   LDLDIRECT 107.0 08/31/2021   TRIG 175 (H) 07/09/2023   CHOLHDL 5.0 07/09/2023     Restart Mounjaro, will start with 2.5 mg once weekly for 4 weeks increase to 5 mg at next visit if well tolerated Patient counseled on low-carb diet Encouraged to engage in regular moderate exercises at least 150 minutes weekly as tolerated Continue Crestor 5 mg daily LDL goal is less than 70, lipid panel at next visit Checking urine microalbumin labs, Patient referred to ophthalmologist for diabetic eye exam        Relevant Medications   tirzepatide [facility]) 2.5 MG/0.5ML  Pen   Other Relevant Orders   Microalbumin/Creatinine Ratio, Urine   Ambulatory referral to Ophthalmology     Genitourinary   CKD stage 3b, GFR 30-44 ml/min (HCC)   Avoid NSAIDs and other nephrotoxic agents Lab Results  Component Value Date   NA 138 07/13/2023   K 3.6 07/13/2023   CO2 24 07/13/2023   GLUCOSE 176 (H) 07/13/2023   BUN 22 (H) 07/13/2023   CREATININE 1.42 (H) 07/13/2023   CALCIUM 9.6 07/13/2023   GFR 35.28 (L) 08/31/2021   GFRNONAA 43 (L) 07/13/2023            Other   Morbid obesity (HCC)   Patient counseled on low-carb diet Encouraged to engage in regular moderate exercise at least 150 minutes weekly as tolerated Restarting Mounjaro for type 2 diabetes Patient referred to plastic surgeon, she is interested in a panniculectomy        Relevant Medications   tirzepatide (MOUNJARO) 2.5 MG/0.5ML Pen   Other Relevant Orders   Ambulatory referral to Plastic Surgery   SOBOE (shortness of breath on exertion) - Primary   Feels much better with blood pressure control Patient encouraged to continue current medications and take them daily as ordered      Other Visit Diagnoses       Screening mammogram for breast cancer       Relevant Orders   MM 3D SCREENING MAMMOGRAM BILATERAL BREAST       Outpatient Encounter Medications as of 07/21/2023  Medication Sig   AIRSUPRA 90-80 MCG/ACT AERO Inhale 2 puffs into the lungs 4 (four) times daily.   isosorbide-hydrALAZINE (BIDIL) 20-37.5 MG tablet Take 2 tablets by mouth 2 (two) times daily.   linaclotide (LINZESS) 72 MCG capsule Take 72 mcg by mouth daily before breakfast.   montelukast (SINGULAIR) 10 MG tablet Take 10 mg by mouth daily.   rosuvastatin (CRESTOR) 5 MG tablet Take 1 tablet (5 mg total) by mouth daily.   tirzepatide [facility]) 2.5 MG/0.5ML Pen Inject 2.5 mg into the skin once a week.   VITAMIN D PO Take 1 tablet by mouth daily. *pt doesn't know units*   Olmesartan-amLODIPine-HCTZ 40-10-25 MG TABS Take 1 tablet by mouth daily. (Patient not taking: Reported on 07/21/2023)   [DISCONTINUED] tirzepatide (MOUNJARO) 7.5 MG/0.5ML Pen Inject 7.5 mg into the skin once a week. (Patient not taking: Reported on 07/21/2023)   No facility-administered encounter medications on file as of 07/21/2023.    Follow-up: Return in about 4 weeks (around 08/18/2023) for HYPERLIPIDEMIA, HTN, DM.   Donell Beers, FNP

## 2023-07-21 NOTE — Assessment & Plan Note (Signed)
Avoid NSAIDs and other nephrotoxic agents Lab Results  Component Value Date   NA 138 07/13/2023   K 3.6 07/13/2023   CO2 24 07/13/2023   GLUCOSE 176 (H) 07/13/2023   BUN 22 (H) 07/13/2023   CREATININE 1.42 (H) 07/13/2023   CALCIUM 9.6 07/13/2023   GFR 35.28 (L) 08/31/2021   GFRNONAA 43 (L) 07/13/2023

## 2023-07-21 NOTE — Assessment & Plan Note (Addendum)
Patient counseled on low-carb diet Encouraged to engage in regular moderate exercise at least 150 minutes weekly as tolerated Restarting Mounjaro for type 2 diabetes Patient referred to plastic surgeon, she is interested in a panniculectomy

## 2023-07-21 NOTE — Patient Instructions (Addendum)
    Please comes fasting to your next appointment    Around 3 times per week, check your blood pressure 2 times per day. once in the morning and once in the evening. The readings should be at least one minute apart. Write down these values and bring them to your next nurse visit/appointment.  When you check your BP, make sure you have been doing something calm/relaxing 5 minutes prior to checking. Both feet should be flat on the floor and you should be sitting. Use your left arm and make sure it is in a relaxed position (on a table), and that the cuff is at the approximate level/height of your heart.    1. Hyperlipidemia associated with type 2 diabetes mellitus (HCC) (Primary)  - Microalbumin/Creatinine Ratio, Urine - POCT glycosylated hemoglobin (Hb A1C) - Ambulatory referral to Ophthalmology - tirzepatide [facility]) 2.5 MG/0.5ML Pen; Inject 2.5 mg into the skin once a week.  Dispense: 2 mL; Refill: 0  2. CKD stage 3b, GFR 30-44 ml/min (HCC)   3. Chronic obstructive pulmonary disease, unspecified COPD type (HCC)   4. Resistant hypertension   5. Screening mammogram for breast cancer  - MM 3D SCREENING MAMMOGRAM BILATERAL BREAST; Future    It is important that you exercise regularly at least 30 minutes 5 times a week as tolerated  Think about what you will eat, plan ahead. Choose " clean, green, fresh or frozen" over canned, processed or packaged foods which are more sugary, salty and fatty. 70 to 75% of food eaten should be vegetables and fruit. Three meals at set times with snacks allowed between meals, but they must be fruit or vegetables. Aim to eat over a 12 hour period , example 7 am to 7 pm, and STOP after  your last meal of the day. Drink water,generally about 64 ounces per day, no other drink is as healthy. Fruit juice is best enjoyed in a healthy way, by EATING the fruit.  Thanks for choosing [facility] we consider it a privelige to serve you.

## 2023-07-21 NOTE — Assessment & Plan Note (Addendum)
Lab Results  Component Value Date   HGBA1C 7.0 (H) 07/09/2023     Lab Results  Component Value Date   CHOL 222 (H) 07/09/2023   HDL 44 07/09/2023   LDLCALC 143 (H) 07/09/2023   LDLDIRECT 107.0 08/31/2021   TRIG 175 (H) 07/09/2023   CHOLHDL 5.0 07/09/2023     Restart Mounjaro, will start with 2.5 mg once weekly for 4 weeks increase to 5 mg at next visit if well tolerated Patient counseled on low-carb diet Encouraged to engage in regular moderate exercises at least 150 minutes weekly as tolerated Continue Crestor 5 mg daily LDL goal is less than 70, lipid panel at next visit Checking urine microalbumin labs, Patient referred to ophthalmologist for diabetic eye exam

## 2023-07-21 NOTE — Assessment & Plan Note (Signed)
Feels much better with blood pressure control Patient encouraged to continue current medications and take them daily as ordered

## 2023-07-23 LAB — MICROALBUMIN / CREATININE URINE RATIO
Creatinine, Urine: 154.5 mg/dL
Microalb/Creat Ratio: 2867 mg/g{creat} — ABNORMAL HIGH (ref 0–29)
Microalbumin, Urine: 4429.6 ug/mL

## 2023-07-25 ENCOUNTER — Encounter (HOSPITAL_BASED_OUTPATIENT_CLINIC_OR_DEPARTMENT_OTHER): Payer: Self-pay

## 2023-07-25 ENCOUNTER — Ambulatory Visit (HOSPITAL_BASED_OUTPATIENT_CLINIC_OR_DEPARTMENT_OTHER): Payer: 59

## 2023-07-25 VITALS — BP 200/84 | HR 80 | Wt 349.2 lb

## 2023-07-25 DIAGNOSIS — I1A0 Resistant hypertension: Secondary | ICD-10-CM | POA: Diagnosis not present

## 2023-07-25 LAB — BASIC METABOLIC PANEL
BUN/Creatinine Ratio: 16 (ref 9–23)
BUN: 22 mg/dL (ref 6–24)
CO2: 22 mmol/L (ref 20–29)
Calcium: 10.4 mg/dL — ABNORMAL HIGH (ref 8.7–10.2)
Chloride: 104 mmol/L (ref 96–106)
Creatinine, Ser: 1.4 mg/dL — ABNORMAL HIGH (ref 0.57–1.00)
Glucose: 201 mg/dL — ABNORMAL HIGH (ref 70–99)
Potassium: 4.3 mmol/L (ref 3.5–5.2)
Sodium: 142 mmol/L (ref 134–144)
eGFR: 44 mL/min/{1.73_m2} — ABNORMAL LOW (ref >59)

## 2023-07-25 LAB — ALDOSTERONE + RENIN ACTIVITY W/ RATIO
Aldos/Renin Ratio: 9.5 (ref 0.0–30.0)
Aldosterone: 10.1 ng/dL (ref 0.0–30.0)
Renin Activity, Plasma: 1.064 ng/mL/h (ref 0.167–5.380)

## 2023-07-25 NOTE — Patient Instructions (Signed)
Medication Instructions:  Your physician recommends that you continue on your current medications as directed. Please refer to the Current Medication list given to you today.  Please pick up and start the combination tablet, it is ready at the [facility] for $16   Follow-Up: 07/31/2023 at 1pm with Gillian Shields, NP

## 2023-07-25 NOTE — Progress Notes (Signed)
   Nurse Visit   Date of Encounter: 07/25/2023 ID: Jenna Stokes, DOB 08/07/1965, MRN [id number]  PCP:  Donell Beers, FNP   [facility] Providers Cardiologist:  Kristeen Miss, MD      Visit Details   VS:  BP (!) 200/84 Comment: forearm- office cuff  Pulse 80   Wt (!) 349 lb 3.2 oz (158.4 kg)   LMP 08/10/2006   BMI 50.10 kg/m  , BMI Body mass index is 50.1 kg/m.  Wt Readings from Last 3 Encounters:  07/25/23 (!) 349 lb 3.2 oz (158.4 kg)  07/21/23 (!) 347 lb (157.4 kg)  07/17/23 (!) 351 lb (159.2 kg)     Reason for visit: Blood Pressure check in  Performed today: Vitals, Provider consulted:Caitlin Walker, NP, and Education Changes (medications, testing, etc.) : Start combination tablet as previously prescribed, follow up in HTN clinic in one week with Gillian Shields, NP  Length of Visit: 10 minutes    Medications Adjustments/Labs and Tests Ordered: No orders of the defined types were placed in this encounter.  No orders of the defined types were placed in this encounter.    Signed, Marlene Lard, RN  07/25/2023 1:28 PM

## 2023-07-31 ENCOUNTER — Encounter (HOSPITAL_BASED_OUTPATIENT_CLINIC_OR_DEPARTMENT_OTHER): Payer: Self-pay | Admitting: Family

## 2023-07-31 ENCOUNTER — Ambulatory Visit (INDEPENDENT_AMBULATORY_CARE_PROVIDER_SITE_OTHER): Payer: 59 | Admitting: Family

## 2023-07-31 VITALS — BP 158/91 | HR 81 | Ht 70.0 in | Wt 344.0 lb

## 2023-07-31 DIAGNOSIS — I1A0 Resistant hypertension: Secondary | ICD-10-CM

## 2023-07-31 DIAGNOSIS — E1165 Type 2 diabetes mellitus with hyperglycemia: Secondary | ICD-10-CM

## 2023-07-31 DIAGNOSIS — N1832 Chronic kidney disease, stage 3b: Secondary | ICD-10-CM

## 2023-07-31 MED ORDER — SPIRONOLACTONE 25 MG PO TABS
25.0000 mg | ORAL_TABLET | Freq: Every day | ORAL | 3 refills | Status: DC
Start: 2023-07-31 — End: 2023-10-22

## 2023-07-31 NOTE — Patient Instructions (Addendum)
 Medication Instructions:  START SPIRONOLACTONE 25 MG DAILY   Labwork: BMET TODAY   BMET IN 1 WEEK   Testing/Procedures: NONE  Follow-Up: KEEP AS SCHEDULED   If you need a refill on your cardiac medications before your next appointment, please call your pharmacy. Donnel Saxon, and [place] are all used to prevent kidney disease progression.

## 2023-07-31 NOTE — Progress Notes (Signed)
 [facility] Assessment:    Date:  07/31/2023   ID:  Jenna Stokes, DOB [birth year], MRN [id number]  PCP:  Donell Beers, FNP  Cardiologist:  Kristeen Miss, MD  Nephrologist:  Referring MD: Donell Beers, FNP   CC: Hypertension  History of Present Illness:    Jenna Stokes is a 58 y.o. female with a hx of  history of morbid obesity, hypertension, DM2, CKD3b.,  Multinodular goiter, COPD. Here to follow up  with [facility].   Admitted 2/4-07/13/23 after presenting with shortness of breath.  Treated for hypertensive urgency.  CT scan chest unremarkable and normal WBC.  She was given Pulmicort nebulizer for COPD while inpatient.  Echo LVEF 70 to 75%, moderate LVH, RV normal, mild MR.  Due to pedal edema bilateral Doppler was negative for DVT.  Discharged on irbesartan, amlodipine, BiDil, Lasix.  Lasix added due to exertional dyspnea, lower extremity edema.  Established with [facility] 07/17/23. Jenna Stokes was diagnosed with hypertension in her 30s or 80s. Home cuff is found to read 10 points high today. No tobacco use.  Irbesartan, amlodipine, Lasix were stopped.  She was started on olmesartan-amlodipine-hydrochlorothiazide 40 - 10 - 25 mg daily.  BiDil 20-37.52 tabs twice daily was continued.  Lab work for hyperaldosteronism unremarkable.  Sleep study ordered but not yet performed is awaiting prior Auth.  Previous diagnosis of multinodular goiter previously seen  by Dr. Talmage Nap.  Ultrasound 04/22/2022 with borderline thyroid megaly suggestive of multinodular goiter with nodule #2 meeting imaging criteria for 1 year follow-up (nodule #2 left, inferior, 1.2 cm). 07/2022 parathyroid planer with no parathyroid adenoma. Previously encouraged to schedule follow up.   Seen in nurse visit 07/25/2023 having not yet made medication changes and was encouraged to do so.  She is hesitant regarding return to work as her BP was routinely greater  than 170s.  Presents today for follow-up.Notes regimen is much easier with combination tablet. Blood pressure at home has been 140-150s. Has been trying to walk more. She reports persistent nonproductive cough with sensation of needing to clear her throat. Reports mild upper airway wheeze that she describes as "hard" and is taking her inhalers regularly.  Encouraged to discuss with PCP and consider trial of daily allergy medicine. She has resumed Mounjaro at dose of 2.5mg  weekly and plans to increase dose.   Previous antihypertensives: Spironolactone Lisinopril Losartan Carvedilol chlorthalidone  Past Medical History:  Diagnosis Date   AKI (acute kidney injury) (HCC) 12/16/2020   DM type 2, uncontrolled, with renal complications 09/20/2013   Hyperlipidemia    Hypertension    Morbid obesity (HCC)     Past Surgical History:  Procedure Laterality Date   ABDOMINAL HYSTERECTOMY     CESAREAN SECTION     INCISION AND DRAINAGE ABSCESS Left 12/20/2020   Procedure: INCISION AND DRAINAGE ABSCESS;  Surgeon: Chevis Pretty III, MD;  Location: [facility];  Service: General;  Laterality: Left;   TONSILLECTOMY      Current Medications: Current Meds  Medication Sig   AIRSUPRA 90-80 MCG/ACT AERO Inhale 2 puffs into the lungs 4 (four) times daily.   isosorbide-hydrALAZINE (BIDIL) 20-37.5 MG tablet Take 2 tablets by mouth 2 (two) times daily.   linaclotide (LINZESS) 72 MCG capsule Take 72 mcg by mouth daily before breakfast.   montelukast (SINGULAIR) 10 MG tablet Take 10 mg by mouth daily.   Olmesartan-amLODIPine-HCTZ 40-10-25 MG TABS Take 1 tablet by mouth daily.   rosuvastatin (CRESTOR)  5 MG tablet Take 1 tablet (5 mg total) by mouth daily.   tirzepatide [facility]) 2.5 MG/0.5ML Pen Inject 2.5 mg into the skin once a week.   VITAMIN D PO Take 1 tablet by mouth daily. 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      Home Situation:       Spiritual Beliefs:       Lifestyle: no regular exercise; diet is so so            Social Drivers of Corporate investment banker Strain: Not on file  Food Insecurity: No Food Insecurity (07/09/2023)   Hunger Vital Sign    Worried About Running Out of Food in the Last Year: Never true    Ran Out of Food in the Last Year: Never true  Transportation Needs: No Transportation Needs (07/09/2023)   PRAPARE - Administrator, Civil Service (Medical): No    Lack of Transportation (Non-Medical): No  Physical Activity: Inactive (01/12/2018)   Exercise Vital Sign    Days of Exercise per Week: 0 days    Minutes of Exercise per Session: 0 min  Stress: No Stress Concern Present (01/12/2018)   [employer] of Occupational Health - Occupational Stress Questionnaire    Feeling of Stress : Only a little  Social Connections: Not on file     Family History: The patient's family history includes Adrenal disorder in her sister; Diabetes in her mother and sister; Heart disease in her mother; Hypertension in her sister.  ROS:   Please see the history of present illness.     All other systems reviewed and are negative.  EKGs/Labs/Other Studies Reviewed:         Recent Labs: 07/08/2023: B Natriuretic Peptide 60.9 07/09/2023: Magnesium 2.3; TSH 0.527 07/12/2023: Hemoglobin 12.0; Platelets 234 07/13/2023: ALT 16 07/17/2023: BUN 22; Creatinine, Ser 1.40; Potassium 4.3; Sodium 142   Recent Lipid  Panel    Component Value Date/Time   CHOL 222 (H) 07/09/2023 1402   TRIG 175 (H) 07/09/2023 1402   HDL 44 07/09/2023 1402   CHOLHDL 5.0 07/09/2023 1402   VLDL 35 07/09/2023 1402   LDLCALC 143 (H) 07/09/2023 1402   LDLDIRECT 107.0 08/31/2021 1036    Physical Exam:   VS:  BP (!) 158/91   Pulse 81   Ht 5\' 10"  (1.778 m)   Wt (!) 344 lb (156 kg)   LMP 08/10/2006   SpO2 97%   BMI 49.36 kg/m  , BMI Body mass index is 49.36 kg/m. GENERAL:  Well appearing HEENT: Pupils equal round and reactive, fundi not visualized, oral mucosa unremarkable NECK:  No jugular venous distention, waveform within normal limits, carotid upstroke brisk and symmetric, no bruits, no thyromegaly LYMPHATICS:  No cervical adenopathy LUNGS:  Clear to auscultation bilaterally HEART:  RRR.  PMI not displaced or sustained,S1 and S2 within normal limits, no S3, no S4, no clicks, no rubs, no murmurs ABD:  Flat, positive bowel sounds normal in frequency in pitch, no bruits, no rebound, no guarding, no midline pulsatile mass, no hepatomegaly, no splenomegaly EXT:  2 plus pulses throughout, no edema, no cyanosis no clubbing SKIN:  No rashes no nodules NEURO:  Cranial nerves II through XII grossly intact, motor grossly intact throughout PSYCH:  Cognitively intact, oriented to person place and time   ASSESSMENT/PLAN:    HTN - BP not at goal <130/80. She is understandably frustrated by multiple tablets. Home BP cuff found to read 10 points high.  Continue Olmesartan-Amlodipine-hydrochlorothiazide 40-10-25mg  daily, Bidil 20-37.5mg  two tablets BID Start spironolactone 25 mg daily.  BMP today and in 1 week for monitoring.  Of note was previously on doses as high as 100 mg daily and felt it worked well.  Will start at lower dose for protection of renal function. Renin-aldosterone with no hyperaldosteronism.  Cleared to return to work 08/04/23 without restrictions.   Snores / Sleep disordered breathing - STOPBang 6.  Itamar home  sleep study previously provided, awaiting prior authorization.   DM2 / Obesity - 07/09/23 A1c 7.0. Started Greggory Keen as prescribed by PCP.    Multinodular goiter - Normal TSH 07/09/23. Prior thyroid US 04/2022 recommended for repeat in 1 year. Has not required thyroid medication. Previously followed with Dr. Talmage Nap. Recommend discussion of repeat thyroid US for monitoring with PCP or Dr. Talmage Nap.  CKDIIIa - Continue to follow with PCP. Careful titration of diuretic and antihypertensive.  Consider Marcelline Deist or Jardiance for cardio and renal protective benefit if needed for glucose control in future.   Screening for Secondary Hypertension:     07/17/2023   12:48 PM  Causes  Renovascular HTN Screened  Sleep Apnea Screened     - Comments 07/2023 itamar ordered  Thyroid Disease Screened     - Comments 07/09/23 normal TSH, known multinodular goiter  Hyperaldosteronism Screened     - Comments 07/17/23 renin-aldo collected  Pheochromocytoma N/A     - Comments normal adrenal by CT  Compliance Screened     - Comments noted prior nonadherence, notes taking regularly now    Relevant Labs/Studies:    Latest Ref Rng & Units 07/17/2023   10:01 AM 07/13/2023    2:50 AM 07/12/2023    2:38 AM  Basic Labs  Sodium 134 - 144 mmol/L 142  138  139   Potassium 3.5 - 5.2 mmol/L 4.3  3.6  3.5   Creatinine 0.57 - 1.00 mg/dL 1.61  0.96  0.45        Latest Ref Rng & Units 07/09/2023    2:02 PM 06/09/2019   12:42 PM  Thyroid   TSH 0.350 - 4.500 uIU/mL 0.527  1.24        Latest Ref Rng & Units 07/17/2023   10:01 AM 01/12/2018    3:27 AM 01/09/2018   12:56 PM  Renin/Aldosterone   Aldosterone 0.0 - 30.0 ng/dL 40.9  4.6  81.1   Aldos/Renin Ratio 0.0 - 30.0 9.5                02/10/2018    9:50 AM  Renovascular   Renal Artery [place] Completed Yes       Disposition:    FU as scheduled with Dr. Duke Salvia   Medication Adjustments/Labs and Tests Ordered: Current medicines are reviewed at length with the patient today.  Concerns regarding medicines are outlined above.  No orders of the defined types were placed in this encounter.  No orders of the defined types were placed in this encounter.    Signed, Alver Sorrow, NP  07/31/2023 1:21 PM    [facility]

## 2023-08-01 ENCOUNTER — Encounter (HOSPITAL_BASED_OUTPATIENT_CLINIC_OR_DEPARTMENT_OTHER): Payer: Self-pay

## 2023-08-01 LAB — BASIC METABOLIC PANEL
BUN/Creatinine Ratio: 16 (ref 9–23)
BUN: 24 mg/dL (ref 6–24)
CO2: 26 mmol/L (ref 20–29)
Calcium: 10.9 mg/dL — ABNORMAL HIGH (ref 8.7–10.2)
Chloride: 102 mmol/L (ref 96–106)
Creatinine, Ser: 1.53 mg/dL — ABNORMAL HIGH (ref 0.57–1.00)
Glucose: 148 mg/dL — ABNORMAL HIGH (ref 70–99)
Potassium: 3.9 mmol/L (ref 3.5–5.2)
Sodium: 142 mmol/L (ref 134–144)
eGFR: 39 mL/min/{1.73_m2} — ABNORMAL LOW (ref >59)

## 2023-08-06 LAB — BASIC METABOLIC PANEL
BUN/Creatinine Ratio: 21 (ref 9–23)
BUN: 35 mg/dL — ABNORMAL HIGH (ref 6–24)
CO2: 22 mmol/L (ref 20–29)
Calcium: 10.7 mg/dL — ABNORMAL HIGH (ref 8.7–10.2)
Chloride: 102 mmol/L (ref 96–106)
Creatinine, Ser: 1.64 mg/dL — ABNORMAL HIGH (ref 0.57–1.00)
Glucose: 137 mg/dL — ABNORMAL HIGH (ref 70–99)
Potassium: 4.3 mmol/L (ref 3.5–5.2)
Sodium: 140 mmol/L (ref 134–144)
eGFR: 36 mL/min/{1.73_m2} — ABNORMAL LOW (ref >59)

## 2023-08-19 ENCOUNTER — Encounter: Payer: Self-pay | Admitting: Nurse Practitioner

## 2023-08-19 ENCOUNTER — Ambulatory Visit (INDEPENDENT_AMBULATORY_CARE_PROVIDER_SITE_OTHER): Payer: Self-pay | Admitting: Nurse Practitioner

## 2023-08-19 VITALS — BP 117/49 | HR 79 | Temp 98.0°F | Wt 343.6 lb

## 2023-08-19 DIAGNOSIS — E785 Hyperlipidemia, unspecified: Secondary | ICD-10-CM

## 2023-08-19 DIAGNOSIS — N1832 Chronic kidney disease, stage 3b: Secondary | ICD-10-CM

## 2023-08-19 DIAGNOSIS — M25511 Pain in right shoulder: Secondary | ICD-10-CM | POA: Insufficient documentation

## 2023-08-19 DIAGNOSIS — M545 Low back pain, unspecified: Secondary | ICD-10-CM | POA: Insufficient documentation

## 2023-08-19 DIAGNOSIS — M25512 Pain in left shoulder: Secondary | ICD-10-CM

## 2023-08-19 DIAGNOSIS — E1169 Type 2 diabetes mellitus with other specified complication: Secondary | ICD-10-CM | POA: Diagnosis not present

## 2023-08-19 MED ORDER — TIRZEPATIDE 7.5 MG/0.5ML ~~LOC~~ SOAJ: 7.5000 mg | SUBCUTANEOUS | 0 refills | Status: DC

## 2023-08-19 MED ORDER — TIRZEPATIDE 5 MG/0.5ML ~~LOC~~ SOAJ: 5.0000 mg | SUBCUTANEOUS | 0 refills | Status: DC

## 2023-08-19 NOTE — Patient Instructions (Signed)
 1. Hyperlipidemia associated with type 2 diabetes mellitus (HCC) (Primary)  - tirzepatide (MOUNJARO) 5 MG/0.5ML Pen; Inject 5 mg into the skin once a week.  Dispense: 6 mL; Refill: 0 - tirzepatide (MOUNJARO) 7.5 MG/0.5ML Pen; Inject 7.5 mg into the skin once a week.  Dispense: 6 mL; Refill: 0    Goal for fasting blood sugar ranges from 80 to 120 and 2 hours after any meal or at bedtime should be between 130 to 170.    It is important that you exercise regularly at least 30 minutes 5 times a week as tolerated  Think about what you will eat, plan ahead. Choose " clean, green, fresh or frozen" over canned, processed or packaged foods which are more sugary, salty and fatty. 70 to 75% of food eaten should be vegetables and fruit. Three meals at set times with snacks allowed between meals, but they must be fruit or vegetables. Aim to eat over a 12 hour period , example 7 am to 7 pm, and STOP after  your last meal of the day. Drink water,generally about 64 ounces per day, no other drink is as healthy. Fruit juice is best enjoyed in a healthy way, by EATING the fruit.  Thanks for choosing [facility] we consider it a privelige to serve you.
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
## 2023-08-19 NOTE — Assessment & Plan Note (Addendum)
 Will increase Mounjaro to 5 mg once weekly for 4 weeks, after 4 weeks if no hypoglycemia increase Mounjaro to 7 mg once weekly CBG goals provided Continue Crestor 5 mg daily for hyperlipidemia, will check lipid panel at next visit  Follow-up in 2 months

## 2023-08-19 NOTE — Assessment & Plan Note (Signed)
 Patient encouraged to take Tylenol 650 mg every 6 hours as needed She denies pain upon examination today

## 2023-08-19 NOTE — Assessment & Plan Note (Addendum)
 Lab Results  Component Value Date   NA 140 08/05/2023   K 4.3 08/05/2023   CO2 22 08/05/2023   GLUCOSE 137 (H) 08/05/2023   BUN 35 (H) 08/05/2023   CREATININE 1.64 (H) 08/05/2023   CALCIUM 10.7 (H) 08/05/2023   GFR 35.28 (L) 08/31/2021   EGFR 36 (L) 08/05/2023   GFRNONAA 43 (L) 07/13/2023  Avoid NSAIDs and other nephrotoxic agents Cardiology plans on repeating labs

## 2023-08-19 NOTE — Assessment & Plan Note (Signed)
 Patient encouraged to take Tylenol 650 mg every 6 hours as needed can not to take NSAIDs due to her kidney function I offered referring patient to orthopedics but states that ''I will let my other doctor do that"

## 2023-08-19 NOTE — Assessment & Plan Note (Signed)
 Wt Readings from Last 3 Encounters:  08/19/23 (!) 343 lb 9.6 oz (155.9 kg)  07/31/23 (!) 344 lb (156 kg)  07/25/23 (!) 349 lb 3.2 oz (158.4 kg)   Body mass index is 49.3 kg/m.    - tirzepatide (MOUNJARO) 5 MG/0.5ML Pen; Inject 5 mg into the skin once a week.  Dispense: 6 mL; Refill: 0 - tirzepatide (MOUNJARO) 7.5 MG/0.5ML Pen; Inject 7.5 mg into the skin once a week.  Dispense: 6 mL; Refill: 0

## 2023-09-01 NOTE — Addendum Note (Signed)
 Addended by: Marlene Lard on: 09/01/2023 03:18 PM   Modules accepted: Orders

## 2023-09-02 ENCOUNTER — Telehealth: Payer: Self-pay

## 2023-09-02 NOTE — Telephone Encounter (Signed)
**Note De-Identified Kiano Terrien Obfuscation** Ordering provider: Gillian Shields, NP Associated diagnoses: Snoring-R06.83 and Sleep-disordered breathing-G47.0  WatchPAT PA obtained on 09/02/2023 by Huan Pollok, Lorelle Formosa, LPN. Authorization: Per the [employer] HEALTH Website: [facility] does not require a prior authorization for CPT code 11914  Patient notified of PIN (1234) on 09/02/2023 Jaysa Kise Notification Method: MyChart message. I also called the pt but got no answer so I left a message on her VM advising her that I have sent her a [facility] message and that if she has any questions or concerns after reading it to contact us.  Phone note routed to covering staff for follow-up.

## 2023-09-19 NOTE — Telephone Encounter (Signed)
 Sleep study not completed or returned, routing to billing

## 2023-09-30 ENCOUNTER — Other Ambulatory Visit: Payer: Self-pay | Admitting: Nurse Practitioner

## 2023-09-30 ENCOUNTER — Ambulatory Visit
Admission: RE | Admit: 2023-09-30 | Discharge: 2023-09-30 | Disposition: A | Discharge status: Home / Self Care | Payer: 59 | Source: Ambulatory Visit | Attending: Nurse Practitioner

## 2023-09-30 DIAGNOSIS — Z1231 Encounter for screening mammogram for malignant neoplasm of breast: Secondary | ICD-10-CM

## 2023-09-30 DIAGNOSIS — N1832 Chronic kidney disease, stage 3b: Secondary | ICD-10-CM

## 2023-09-30 DIAGNOSIS — J45909 Unspecified asthma, uncomplicated: Secondary | ICD-10-CM

## 2023-09-30 DIAGNOSIS — E1169 Type 2 diabetes mellitus with other specified complication: Secondary | ICD-10-CM

## 2023-09-30 DIAGNOSIS — R0602 Shortness of breath: Secondary | ICD-10-CM

## 2023-09-30 DIAGNOSIS — I1A0 Resistant hypertension: Secondary | ICD-10-CM

## 2023-10-02 ENCOUNTER — Other Ambulatory Visit: Payer: Self-pay | Admitting: Nurse Practitioner

## 2023-10-02 DIAGNOSIS — E1169 Type 2 diabetes mellitus with other specified complication: Secondary | ICD-10-CM

## 2023-10-02 DIAGNOSIS — R0602 Shortness of breath: Secondary | ICD-10-CM

## 2023-10-02 DIAGNOSIS — J45909 Unspecified asthma, uncomplicated: Secondary | ICD-10-CM

## 2023-10-02 DIAGNOSIS — Z1231 Encounter for screening mammogram for malignant neoplasm of breast: Secondary | ICD-10-CM

## 2023-10-02 DIAGNOSIS — I1A0 Resistant hypertension: Secondary | ICD-10-CM

## 2023-10-02 DIAGNOSIS — N1832 Chronic kidney disease, stage 3b: Secondary | ICD-10-CM

## 2023-10-03 ENCOUNTER — Other Ambulatory Visit: Payer: Self-pay | Admitting: Nurse Practitioner

## 2023-10-03 DIAGNOSIS — R928 Other abnormal and inconclusive findings on diagnostic imaging of breast: Secondary | ICD-10-CM

## 2023-10-06 ENCOUNTER — Other Ambulatory Visit: Payer: Self-pay | Admitting: Nurse Practitioner

## 2023-10-06 ENCOUNTER — Other Ambulatory Visit: Payer: Self-pay

## 2023-10-06 DIAGNOSIS — E1169 Type 2 diabetes mellitus with other specified complication: Secondary | ICD-10-CM

## 2023-10-06 MED ORDER — TIRZEPATIDE 7.5 MG/0.5ML ~~LOC~~ SOAJ: 7.5000 mg | SUBCUTANEOUS | 0 refills | Status: DC

## 2023-10-06 NOTE — Telephone Encounter (Signed)
 Sent to the provider. KH

## 2023-10-06 NOTE — Telephone Encounter (Signed)
 Please advise [facility]

## 2023-10-08 ENCOUNTER — Other Ambulatory Visit: Payer: Self-pay | Admitting: Nurse Practitioner

## 2023-10-08 ENCOUNTER — Telehealth: Payer: Self-pay

## 2023-10-08 DIAGNOSIS — E1169 Type 2 diabetes mellitus with other specified complication: Secondary | ICD-10-CM

## 2023-10-08 NOTE — Telephone Encounter (Signed)
 Sent to provider

## 2023-10-10 ENCOUNTER — Telehealth: Payer: Self-pay

## 2023-10-10 NOTE — Telephone Encounter (Signed)
 Called patient to walk her through the download process for the [facility] Sleep Test. No answer, left VM with callback number for sleep coordinator.

## 2023-10-14 ENCOUNTER — Telehealth (HOSPITAL_BASED_OUTPATIENT_CLINIC_OR_DEPARTMENT_OTHER): Payer: Self-pay | Admitting: Cardiovascular Disease

## 2023-10-14 DIAGNOSIS — I1A0 Resistant hypertension: Secondary | ICD-10-CM

## 2023-10-14 NOTE — Telephone Encounter (Signed)
 Would recommend repeat BMET (overdue) to reassess renal function on half tablet of Spironolactone .   Clearnce Curia, NP

## 2023-10-14 NOTE — Telephone Encounter (Signed)
 Doesn't look like pt every got repeat BMP drawn, get before visit or wait and see how she is doing first?

## 2023-10-14 NOTE — Telephone Encounter (Signed)
  Patient would like to know if she needs to have any labs done before her appointment on 10/23/23

## 2023-10-14 NOTE — Telephone Encounter (Signed)
 Returned call to pt, no answer, left detailed message (ok per DPR) to have labs drawn.

## 2023-10-21 ENCOUNTER — Other Ambulatory Visit (HOSPITAL_COMMUNITY): Payer: Self-pay | Admitting: Nurse Practitioner

## 2023-10-22 ENCOUNTER — Ambulatory Visit (HOSPITAL_BASED_OUTPATIENT_CLINIC_OR_DEPARTMENT_OTHER): Payer: Self-pay | Admitting: Family

## 2023-10-22 LAB — BASIC METABOLIC PANEL WITH GFR
BUN/Creatinine Ratio: 19 (ref 9–23)
BUN: 33 mg/dL — ABNORMAL HIGH (ref 6–24)
CO2: 22 mmol/L (ref 20–29)
Calcium: 9.9 mg/dL (ref 8.7–10.2)
Chloride: 101 mmol/L (ref 96–106)
Creatinine, Ser: 1.75 mg/dL — ABNORMAL HIGH (ref 0.57–1.00)
Glucose: 190 mg/dL — ABNORMAL HIGH (ref 70–99)
Potassium: 4.3 mmol/L (ref 3.5–5.2)
Sodium: 139 mmol/L (ref 134–144)
eGFR: 34 mL/min/{1.73_m2} — ABNORMAL LOW (ref >59)

## 2023-10-23 ENCOUNTER — Encounter (HOSPITAL_BASED_OUTPATIENT_CLINIC_OR_DEPARTMENT_OTHER): Payer: Self-pay | Admitting: Cardiovascular Disease

## 2023-10-23 ENCOUNTER — Ambulatory Visit (HOSPITAL_BASED_OUTPATIENT_CLINIC_OR_DEPARTMENT_OTHER): Payer: 59 | Admitting: Cardiovascular Disease

## 2023-10-23 VITALS — BP 146/68 | HR 78 | Ht 70.0 in | Wt 358.6 lb

## 2023-10-23 DIAGNOSIS — I1A0 Resistant hypertension: Secondary | ICD-10-CM

## 2023-10-23 DIAGNOSIS — E1169 Type 2 diabetes mellitus with other specified complication: Secondary | ICD-10-CM | POA: Diagnosis not present

## 2023-10-23 DIAGNOSIS — N1832 Chronic kidney disease, stage 3b: Secondary | ICD-10-CM | POA: Diagnosis not present

## 2023-10-23 DIAGNOSIS — E785 Hyperlipidemia, unspecified: Secondary | ICD-10-CM

## 2023-10-23 MED ORDER — ISOSORB DINITRATE-HYDRALAZINE 20-37.5 MG PO TABS: ORAL_TABLET | ORAL | 3 refills | Status: AC

## 2023-10-23 MED ORDER — DAPAGLIFLOZIN PROPANEDIOL 10 MG PO TABS
10.0000 mg | ORAL_TABLET | Freq: Every day | ORAL | Status: AC
Start: 2023-10-23 — End: 2023-11-06

## 2023-10-23 MED ORDER — DAPAGLIFLOZIN PROPANEDIOL 10 MG PO TABS: 10.0000 mg | ORAL_TABLET | Freq: Every day | ORAL | 1 refills | Status: DC

## 2023-10-23 NOTE — Progress Notes (Signed)
 Hypertension Clinic Follow Up:    Date:  10/23/2023   ID:  Jenna Stokes, DOB 28-Jan-1966, MRN [id number]  PCP:  Paseda, Folashade R, FNP  Cardiologist:  Ahmad Alert, MD   Referring MD: No ref. provider found   CC: Hypertension  History of Present Illness:    Jenna Stokes is a 58 y.o. female with a hx of resistant hypertension, diabetes, CKD 3B, hyperlipidemia, COPD, and morbid obesity here for follow-up.  She first establish care in the advanced hypertension clinic with Neomi Banks, NP on 07/2023.  She was admitted earlier that month with hypertensive urgency.  She was also treated for COPD.  Echo at the time revealed LVEF 70-75% with moderate LVH and mild MR.  She was discharged on irbesartan , amlodipine , BiDil , and Lasix .  At that time she was transition to Tribenzor and BiDil .  Labs were negative for hyperaldosteronism.  Sleep study was ordered but not performed due to insurance issues.  She followed up with [facility] 07/2023 and blood pressures were in the 140s to 150s.  Spironolactone  was added to her regimen.  However it was discontinued due to worsening renal function.  Discussed the use of AI scribe software for clinical note transcription with the patient, who gave verbal consent to proceed.  History of Present Illness Jenna Stokes notes that in January, she was admitted to the hospital with a blood pressure over 225 mmHg, which was difficult to control. Since then, her home blood pressure readings have fluctuated between the 130s and 140s. She is currently taking a combination of amlodipine , olmesartan , and hydrochlorothiazide , as well as Bidil  twice a day. She did not start spironolactone  due to concerns about kidney function.  She has been taking Mounjaro , starting with 2 mg, then 5 mg, and now 7 mg for the past two weeks, but she does not feel any difference. She experiences chronic knee pain due to bone-on-bone contact, which has been present for about four years, limiting  her ability to exercise. Despite this, she has lost weight from a previous high of 370-380 pounds. She tries to maintain a healthy diet, focusing on grilled chicken, salads, fish, and vegetables, and avoids bread and salt.  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 She previously attended a program at [facility] but stopped due to insurance issues. She plans to increase her physical activity over the summer when her schedule allows more flexibility.  She has a history of thyroid  nodules, which have been monitored, and her thyroid  hormone levels have been stable. She also reports swelling in her feet, which she attributes to prolonged sitting and possibly the heat. She drinks 7-10 bottles of water a day but finds it challenging to manage fluid intake while working.  She has experienced hair loss and is concerned about a recent abnormal breast exam that requires further evaluation. She has a history of breast cysts that were previously found to be non-cancerous.    Past Medical History:  Diagnosis Date   AKI (acute kidney injury) (HCC) 12/16/2020   DM type 2, uncontrolled, with renal complications 09/20/2013   Hyperlipidemia    Hypertension    Morbid obesity (HCC)     Past Surgical History:  Procedure Laterality Date   ABDOMINAL HYSTERECTOMY     CESAREAN SECTION     INCISION AND DRAINAGE ABSCESS Left 12/20/2020   Procedure: INCISION AND DRAINAGE ABSCESS;  Surgeon: Caralyn Chandler, MD;  Location:  [facility];  Service: General;  Laterality: Left;   TONSILLECTOMY      Current Medications: Current Meds  Medication Sig   dapagliflozin propanediol (FARXIGA) 10 MG TABS tablet Take 1 tablet (10 mg total) by mouth daily before breakfast for 14 days.   dapagliflozin propanediol (FARXIGA) 10 MG TABS tablet Take 1 tablet (10 mg total) by mouth daily before breakfast.   linaclotide (LINZESS) 72 MCG capsule Take 72 mcg by mouth daily before  breakfast.   Olmesartan -amLODIPine -HCTZ 40-10-25 MG TABS Take 1 tablet by mouth daily.   rosuvastatin  (CRESTOR ) 5 MG tablet Take 1 tablet (5 mg total) by mouth daily.   tirzepatide  (MOUNJARO ) 7.5 MG/0.5ML Pen Inject 7.5 mg into the skin once a week.   VITAMIN D PO Take 1 tablet by mouth daily. *pt doesn't know units*   [DISCONTINUED] isosorbide -hydrALAZINE  (BIDIL ) 20-37.5 MG tablet Take 2 tablets by mouth 2 (two) times daily.     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      Home Situation:       Spiritual Beliefs:       Lifestyle: no regular exercise; diet is so so            Social Drivers of Corporate investment banker Strain: Not on file  Food Insecurity: No Food Insecurity (07/09/2023)   Hunger Vital Sign    Worried About Running Out of Food in the Last Year: Never true    Ran Out of Food in the Last Year: Never true  Transportation Needs: No Transportation Needs (07/09/2023)   PRAPARE - Administrator, Civil Service (Medical): No    Lack of Transportation (Non-Medical): No  Physical Activity: Inactive (01/12/2018)   Exercise Vital Sign    Days of Exercise per Week: 0 days    Minutes of Exercise per Session: 0 min  Stress: No Stress Concern Present (01/12/2018)   [employer] of Occupational Health - Occupational Stress Questionnaire    Feeling of Stress : Only a little  Social Connections: Not on file     Family History: The  patient's family history includes Adrenal disorder in her sister; Diabetes in her mother and sister; Heart disease in her mother; Hypertension in her sister.  ROS:   Please see the history of present illness.     All other systems reviewed and are negative.  EKGs/Labs/Other Studies Reviewed:    EKG:  EKG is not ordered today.   Recent Labs: 07/08/2023: B Natriuretic Peptide 60.9 07/09/2023: Magnesium  2.3; TSH 0.527 07/12/2023: Hemoglobin 12.0; Platelets 234 07/13/2023: ALT 16 10/21/2023: BUN 33; Creatinine, Ser 1.75; Potassium 4.3; Sodium 139   Recent Lipid Panel    Component Value Date/Time   CHOL 222 (H) 07/09/2023 1402   TRIG 175 (H) 07/09/2023 1402   HDL 44 07/09/2023 1402   CHOLHDL 5.0 07/09/2023 1402  VLDL 35 07/09/2023 1402   LDLCALC 143 (H) 07/09/2023 1402   LDLDIRECT 107.0 08/31/2021 1036    Physical Exam:    VS:  BP (!) 146/68   Pulse 78   Ht 5\' 10"  (1.778 m)   Wt (!) 358 lb 9.6 oz (162.7 kg)   LMP 08/10/2006   SpO2 98%   BMI 51.45 kg/m  , BMI Body mass index is 51.45 kg/m. GENERAL:  Well appearing HEENT: Pupils equal round and reactive, fundi not visualized, oral mucosa unremarkable NECK:  No jugular venous distention, waveform within normal limits, carotid upstroke brisk and symmetric, no bruits, no thyromegaly LUNGS:  Clear to auscultation bilaterally HEART:  RRR.  PMI not displaced or sustained,S1 and S2 within normal limits, no S3, no S4, no clicks, no rubs, no murmurs ABD:  Flat, positive bowel sounds normal in frequency in pitch, no bruits, no rebound, no guarding, no midline pulsatile mass, no hepatomegaly, no splenomegaly EXT:  2 plus pulses throughout, no edema, no cyanosis no clubbing SKIN:  No rashes no nodules NEURO:  Cranial nerves II through XII grossly intact, motor grossly intact throughout PSYCH:  Cognitively intact, oriented to person place and time   ASSESSMENT/PLAN:    Assessment & Plan # Hypertension:  # HFpEF:  Hypertension improved  but above target. Discussed increasing Bidil  and potential benefits of Farxiga for heart failure management.  She also has LE edema and exertional dyspnea.  Suspect that HFpEF is contributing.  - Increase Bidil  to two tablets three times daily. - Provide Farxiga sample to assess tolerance and effectiveness. - Limit water intake to six bottles daily. - Encourage increased physical activity for blood pressure control.  # Chronic kidney disease 3b:  Declining renal function. Discussed Farxiga's benefits in slowing disease progression. - Monitor renal function with current medication regimen. - Evaluate response to Farxiga for potential continuation.  # Morbid obesity Obesity management challenging due to lifestyle constraints and knee pain. Discussed benefits of increased physical activity and engagement with the Healthy Weight [facility] Wellness program. - Encourage participation in the Healthy Weight [facility] Wellness program. - Promote increased physical activity, including water-based exercises and chair aerobics. - Continue Mounjaro  injections as prescribed. - Refer to [facility]  # Knee osteoarthritis Chronic knee pain limits physical activity. Discussed non-weight bearing exercises to reduce joint stress. - Encourage water-based exercises and stationary biking.  # Thyroid  nodules Thyroid  nodules with normal hormone levels. Regular monitoring necessary. - Continue regular monitoring of thyroid  hormone levels.  # Breast cysts Recent mammogram indicated need for further evaluation. Previous benign cysts. - Schedule follow-up ultrasound for further evaluation.   Disposition:    F/u in 1-2 months   Medication Adjustments/Labs and Tests Ordered: Current medicines are reviewed at length with the patient today.  Concerns regarding medicines are outlined above.  No orders of the defined types were placed in this encounter.  Meds ordered this encounter  Medications   dapagliflozin  propanediol (FARXIGA) 10 MG TABS tablet    Sig: Take 1 tablet (10 mg total) by mouth daily before breakfast for 14 days.    Lot Number?:   [id number]    Expiration Date?:   04/02/2026   dapagliflozin propanediol (FARXIGA) 10 MG TABS tablet    Sig: Take 1 tablet (10 mg total) by mouth daily before breakfast.    Dispense:  90 tablet    Refill:  1   isosorbide -hydrALAZINE  (BIDIL ) 20-37.5 MG tablet    Sig: TAKE 2 TABLETS IN THE MORNING  AND 3 TABLETS IN THE EVENING    Dispense:  450 tablet    Refill:  3    NEW DOSE, D/C PREVIOUS RX. PATIENT WILL CALL WHEN NEEDS FILLED     Signed, Maudine Sos, MD  10/23/2023 2:12 PM    [facility]

## 2023-10-23 NOTE — Patient Instructions (Addendum)
 Medication Instructions:  START FARXIGA 10 MG DAILY   INCREASE BIDIL  TO 2 TABLETS IN THE MORNING AND 3 TABLET IN THE EVENING   Labwork: NONE  Testing/Procedures: NONE  Follow-Up: 6 TO 8 WEEKS WITH DR [place] OR CAITLIN W NP   If you need a refill on your cardiac medications before your next appointment, please call your pharmacy.

## 2023-10-31 ENCOUNTER — Encounter (HOSPITAL_BASED_OUTPATIENT_CLINIC_OR_DEPARTMENT_OTHER): Payer: 59 | Admitting: Cardiovascular Disease

## 2023-11-07 ENCOUNTER — Encounter (HOSPITAL_BASED_OUTPATIENT_CLINIC_OR_DEPARTMENT_OTHER): Payer: Self-pay | Admitting: *Deleted

## 2023-12-24 ENCOUNTER — Encounter (INDEPENDENT_AMBULATORY_CARE_PROVIDER_SITE_OTHER): Payer: Self-pay | Admitting: Cardiology

## 2023-12-24 DIAGNOSIS — G4733 Obstructive sleep apnea (adult) (pediatric): Secondary | ICD-10-CM

## 2023-12-25 ENCOUNTER — Telehealth: Payer: Self-pay

## 2023-12-25 ENCOUNTER — Other Ambulatory Visit (HOSPITAL_BASED_OUTPATIENT_CLINIC_OR_DEPARTMENT_OTHER): Payer: Self-pay

## 2023-12-25 ENCOUNTER — Ambulatory Visit (INDEPENDENT_AMBULATORY_CARE_PROVIDER_SITE_OTHER): Admitting: Family

## 2023-12-25 VITALS — BP 130/70 | HR 80 | Ht 70.0 in | Wt 345.0 lb

## 2023-12-25 DIAGNOSIS — I10 Essential (primary) hypertension: Secondary | ICD-10-CM

## 2023-12-25 DIAGNOSIS — E1122 Type 2 diabetes mellitus with diabetic chronic kidney disease: Secondary | ICD-10-CM

## 2023-12-25 DIAGNOSIS — I5032 Chronic diastolic (congestive) heart failure: Secondary | ICD-10-CM

## 2023-12-25 DIAGNOSIS — N1832 Chronic kidney disease, stage 3b: Secondary | ICD-10-CM

## 2023-12-25 DIAGNOSIS — E782 Mixed hyperlipidemia: Secondary | ICD-10-CM

## 2023-12-25 MED ORDER — DAPAGLIFLOZIN PROPANEDIOL 10 MG PO TABS
10.0000 mg | ORAL_TABLET | Freq: Every day | ORAL | 2 refills | Status: AC
Start: 2023-12-25 — End: ?
  Filled 2023-12-25: qty 30, 30d supply, fill #0
  Filled 2024-04-09 – 2024-04-21 (×2): qty 30, 30d supply, fill #1

## 2023-12-25 NOTE — Progress Notes (Signed)
 Advanced Hypertension Clinic Assessment:    Date:  12/25/2023   ID:  Jenna Stokes, DOB 03-05-1966, MRN [id number]  PCP:  Paseda, Folashade R, FNP  Cardiologist:  Aleene Passe, MD (Inactive)  Nephrologist:  Referring MD: Paseda, Folashade R, FNP   CC: Hypertension  History of Present Illness:    Jenna Stokes is a 58 y.o. female with a hx of  history of morbid obesity, hypertension, DM2, CKD3b.,  Multinodular goiter, COPD. Here to follow up  with Advanced Hypertension Clinic.   Admitted 2/4-07/13/23 after presenting with shortness of breath.  Treated for hypertensive urgency.  CT scan chest unremarkable and normal WBC.  She was given Pulmicort  nebulizer for COPD while inpatient.  Echo LVEF 70 to 75%, moderate LVH, RV normal, mild MR.  Due to pedal edema bilateral Doppler was negative for DVT.  Discharged on irbesartan , amlodipine , BiDil , Lasix .  Lasix  added due to exertional dyspnea, lower extremity edema.  Established with Advanced Hypertension Clinic 07/17/23. Jenna Stokes was diagnosed with hypertension in her 30s or 67s. No tobacco use.  Irbesartan , amlodipine , Lasix  were stopped.  She was started on olmesartan -amlodipine -hydrochlorothiazide  40 - 10 - 25 mg daily.  BiDil  20-37.52 tabs twice daily was continued.  Lab work for hyperaldosteronism unremarkable.  Sleep study ordered.  Previous diagnosis of multinodular goiter previously seen  by Dr. Tommas.  Ultrasound 04/22/2022 with borderline thyroid  megaly suggestive of multinodular goiter with nodule #2 meeting imaging criteria for 1 year follow-up (nodule #2 left, inferior, 1.2 cm). 07/2022 parathyroid  planer with no parathyroid  adenoma. Previously encouraged to schedule follow up.   At visit 10/23/23 Bidil  increased to two tablets TID. Farxiga  samples provided for LE edema and HFpEF benefit.   Presents today for follow up. BP overall controlled at home. Has not started farxiga . LE edema improved but not resolved. No chest pain,  dyspnea, orthopnea, PND. Sleep study with mild OSA with plans for CPAP. Successful weight loss on GLP1, tolerating well.  Previous antihypertensives: Spironolactone  Lisinopril  Losartan Carvedilol  chlorthalidone   Past Medical History:  Diagnosis Date   AKI (acute kidney injury) (HCC) 12/16/2020   DM type 2, uncontrolled, with renal complications 09/20/2013   Hyperlipidemia    Hypertension    Morbid obesity (HCC)     Past Surgical History:  Procedure Laterality Date   ABDOMINAL HYSTERECTOMY     CESAREAN SECTION     INCISION AND DRAINAGE ABSCESS Left 12/20/2020   Procedure: INCISION AND DRAINAGE ABSCESS;  Surgeon: Curvin Deward MOULD, MD;  Location: [facility];  Service: General;  Laterality: Left;   TONSILLECTOMY      Current Medications: Current Meds  Medication Sig   dapagliflozin  propanediol (FARXIGA ) 10 MG TABS tablet Take 1 tablet (10 mg total) by mouth daily before breakfast.   isosorbide -hydrALAZINE  (BIDIL ) 20-37.5 MG tablet TAKE 2 TABLETS IN THE MORNING AND 3 TABLETS IN THE EVENING   linaclotide (LINZESS) 72 MCG capsule Take 72 mcg by mouth daily before breakfast.   Olmesartan -amLODIPine -HCTZ 40-10-25 MG TABS Take 1 tablet by mouth daily.   rosuvastatin  (CRESTOR ) 5 MG tablet Take 1 tablet (5 mg total) by mouth daily.   tirzepatide  (MOUNJARO ) 7.5 MG/0.5ML Pen Inject 7.5 mg into the skin once a week.   VITAMIN D PO Take 1 tablet by mouth daily. 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      Home Situation:       Spiritual Beliefs:       Lifestyle: no regular exercise; diet is so so            Social Drivers of Corporate investment banker Strain: Not on file  Food Insecurity: No Food Insecurity (07/09/2023)   Hunger Vital Sign    Worried About Running Out of Food in the Last Year: Never true    Ran Out of Food in the Last Year: Never true  Transportation Needs: No Transportation Needs (07/09/2023)   PRAPARE - Administrator, Civil Service (Medical): No    Lack of Transportation (Non-Medical): No  Physical Activity: Inactive (01/12/2018)   Exercise Vital Sign    Days of Exercise per Week: 0 days    Minutes of Exercise per Session: 0 min  Stress: No Stress Concern Present (01/12/2018)   [employer] of Occupational Health - Occupational Stress Questionnaire    Feeling of Stress : Only a little  Social Connections: Not on file     Family History: The patient's family history includes Adrenal disorder in her sister; Diabetes in her mother and sister; Heart disease in her mother; Hypertension in her sister.  ROS:   Please see the history of present illness.     All other systems reviewed and are negative.  EKGs/Labs/Other Studies Reviewed:         Recent Labs: 07/08/2023: B Natriuretic Peptide 60.9 07/09/2023: Magnesium  2.3; TSH 0.527 07/12/2023: Hemoglobin 12.0; Platelets 234 07/13/2023: ALT 16 10/21/2023: BUN 33; Creatinine, Ser 1.75; Potassium 4.3; Sodium 139   Recent Lipid Panel    Component Value Date/Time   CHOL 222 (H) 07/09/2023 1402   TRIG 175 (H) 07/09/2023 1402   HDL 44 07/09/2023 1402   CHOLHDL 5.0 07/09/2023 1402   VLDL 35 07/09/2023 1402   LDLCALC 143 (H) 07/09/2023 1402   LDLDIRECT 107.0 08/31/2021 1036    Physical Exam:   VS:  BP 130/70 (BP Location: Left Arm, Patient Position: Sitting, Cuff Size: Large)   Pulse 80   Ht 5' 10 (1.778 m)   Wt (!) 345 lb (156.5  kg)   LMP 08/10/2006   SpO2 97%   BMI 49.50 kg/m  , BMI Body mass index is 49.5 kg/m. GENERAL:  Well appearing HEENT: Pupils equal round and reactive, fundi not visualized, oral mucosa unremarkable NECK:  No jugular venous distention, waveform within normal limits, carotid upstroke brisk and symmetric, no bruits, no thyromegaly LYMPHATICS:  No cervical adenopathy LUNGS:  Clear to auscultation bilaterally HEART:  RRR.  PMI not displaced or sustained,S1 and S2 within normal limits, no S3, no S4, no clicks, no rubs, no murmurs ABD:  Flat, positive bowel sounds normal in frequency in pitch, no bruits, no rebound, no guarding, no midline pulsatile mass, no hepatomegaly, no splenomegaly EXT:  2 plus pulses throughout, no edema, no cyanosis no clubbing SKIN:  No rashes no nodules NEURO:  Cranial nerves II through XII grossly intact, motor grossly intact throughout PSYCH:  Cognitively intact, oriented to person place and time   ASSESSMENT/PLAN:    HTN / HFpEF - BP at goal <130/80. Continue Olmesartan -Amlodipine -hydrochlorothiazide  40-10-25mg  daily, Bidil  20-37.5mg  two tablets AM and 3 tablets PM. Discussed to monitor BP at home at least 2 hours after medications and sitting for 5-10 minutes.  Renin-aldosterone with no hyperaldosteronism.    OSA - mild by sleep study. Awaiting CPAP.   DM2 / Obesity - 07/09/23 A1c 7.0. Continue Mounjaro .  Update A1c, lipid panel.  Multinodular goiter - Normal TSH 07/09/23. Prior thyroid  US  04/2022 recommended for repeat in 1 year. Has not required thyroid  medication. Previously followed with Dr. Balan. Recommend discussion of repeat thyroid  US  for monitoring with PCP or Dr. Tommas.  CKDIIIa - Continue to follow with PCP. Careful titration of diuretic and antihypertensive.  Rx Farxiga  10mg  daily. CMET today.  Screening for Secondary Hypertension:     07/17/2023   12:48 PM  Causes  Renovascular HTN Screened  Sleep Apnea Screened     - Comments 07/2023 itamar  ordered  Thyroid  Disease Screened     - Comments 07/09/23 normal TSH, known multinodular goiter  Hyperaldosteronism Screened     - Comments 07/17/23 renin-aldo collected  Pheochromocytoma N/A     - Comments normal adrenal by CT  Compliance Screened     - Comments noted prior nonadherence, notes taking regularly now    Relevant Labs/Studies:    Latest Ref Rng & Units 10/21/2023   11:54 AM 08/05/2023   11:54 AM 07/31/2023    1:55 PM  Basic Labs  Sodium 134 - 144 mmol/L 139  140  142   Potassium 3.5 - 5.2 mmol/L 4.3  4.3  3.9   Creatinine 0.57 - 1.00 mg/dL 8.24  8.35  8.46        Latest Ref Rng & Units 07/09/2023    2:02 PM 06/09/2019   12:42 PM  Thyroid    TSH 0.350 - 4.500 uIU/mL 0.527  1.24        Latest Ref Rng & Units 07/17/2023   10:01 AM 01/12/2018    3:27 AM 01/09/2018   12:56 PM  Renin/Aldosterone   Aldosterone 0.0 - 30.0 ng/dL 89.8  4.6  77.8   Aldos/Renin Ratio 0.0 - 30.0 9.5                02/10/2018    9:50 AM  Renovascular   Renal Artery US  Completed Yes       Disposition:     follow up in 3-4 mos  Medication Adjustments/Labs and Tests Ordered: Current medicines are reviewed at length with the patient today.  Concerns regarding medicines are outlined above.  No orders of the defined types were placed in this encounter.  No orders of the defined types were placed in this encounter.    Signed, Reche GORMAN Finder, NP  12/25/2023 2:00 PM    [facility]

## 2023-12-25 NOTE — Telephone Encounter (Signed)
 Pt will need to reach [facility] to  [facility] at [phone]. She needs to follow up form her mammogram in April.  No Answer  lvm. KH

## 2023-12-25 NOTE — Patient Instructions (Signed)
 Medication Instructions:  START Farxiga  10mg  daily   Labwork: Your physician recommends that you return for lab work today: CMET, lipid panel, A1c    Follow-Up: Please follow up in 3-4 months in [facility] with Dr. Raford, Reche Finder, NP or Allean Mink PharmD    Special Instructions:   Keep up the great work with weight loss!

## 2023-12-26 ENCOUNTER — Other Ambulatory Visit (HOSPITAL_BASED_OUTPATIENT_CLINIC_OR_DEPARTMENT_OTHER): Payer: Self-pay | Admitting: *Deleted

## 2023-12-26 ENCOUNTER — Ambulatory Visit: Attending: Family

## 2023-12-26 ENCOUNTER — Other Ambulatory Visit (HOSPITAL_BASED_OUTPATIENT_CLINIC_OR_DEPARTMENT_OTHER): Payer: Self-pay

## 2023-12-26 ENCOUNTER — Ambulatory Visit (HOSPITAL_BASED_OUTPATIENT_CLINIC_OR_DEPARTMENT_OTHER): Payer: Self-pay | Admitting: Family

## 2023-12-26 DIAGNOSIS — N1832 Chronic kidney disease, stage 3b: Secondary | ICD-10-CM

## 2023-12-26 DIAGNOSIS — E1169 Type 2 diabetes mellitus with other specified complication: Secondary | ICD-10-CM

## 2023-12-26 LAB — COMPREHENSIVE METABOLIC PANEL WITH GFR
ALT: 26 IU/L (ref 0–32)
AST: 25 IU/L (ref 0–40)
Albumin: 4.4 g/dL (ref 3.8–4.9)
Alkaline Phosphatase: 78 IU/L (ref 44–121)
BUN/Creatinine Ratio: 16 (ref 9–23)
BUN: 30 mg/dL — ABNORMAL HIGH (ref 6–24)
Bilirubin Total: 0.3 mg/dL (ref 0.0–1.2)
CO2: 22 mmol/L (ref 20–29)
Calcium: 10.8 mg/dL — ABNORMAL HIGH (ref 8.7–10.2)
Chloride: 102 mmol/L (ref 96–106)
Creatinine, Ser: 1.87 mg/dL — ABNORMAL HIGH (ref 0.57–1.00)
Globulin, Total: 2.8 g/dL (ref 1.5–4.5)
Glucose: 114 mg/dL — ABNORMAL HIGH (ref 70–99)
Potassium: 3.8 mmol/L (ref 3.5–5.2)
Sodium: 143 mmol/L (ref 134–144)
Total Protein: 7.2 g/dL (ref 6.0–8.5)
eGFR: 31 mL/min/1.73 — ABNORMAL LOW (ref >59)

## 2023-12-26 LAB — HEMOGLOBIN A1C
Est. average glucose Bld gHb Est-mCnc: 143 mg/dL
Hgb A1c MFr Bld: 6.6 % — ABNORMAL HIGH (ref 4.8–5.6)

## 2023-12-26 LAB — LIPID PANEL
Chol/HDL Ratio: 5.1 ratio — ABNORMAL HIGH (ref 0.0–4.4)
Cholesterol, Total: 218 mg/dL — ABNORMAL HIGH (ref 100–199)
HDL: 43 mg/dL (ref >39)
LDL Chol Calc (NIH): 138 mg/dL — ABNORMAL HIGH (ref 0–99)
Triglycerides: 208 mg/dL — ABNORMAL HIGH (ref 0–149)
VLDL Cholesterol Cal: 37 mg/dL (ref 5–40)

## 2023-12-26 MED ORDER — ROSUVASTATIN CALCIUM 10 MG PO TABS
10.0000 mg | ORAL_TABLET | Freq: Every day | ORAL | 3 refills | Status: DC
  Filled 2023-12-26: qty 90, 90d supply, fill #0

## 2023-12-26 MED ORDER — ROSUVASTATIN CALCIUM 10 MG PO TABS: 10.0000 mg | ORAL_TABLET | Freq: Every day | ORAL | 3 refills | Status: DC

## 2023-12-26 NOTE — Procedures (Signed)
   SLEEP STUDY REPORT Patient Information Study Date: 12/24/2023 Patient Name: Jenna Stokes Patient ID: [id number] Birth Date: 07-10-1965 Age: 58 Gender: Female BMI: 51.9 (W=350 lb, H=5' 9'') Referring Physician: Reche Finder, NP  TEST DESCRIPTION: Home sleep apnea testing was completed using the WatchPat, a Type 1 device, utilizing  peripheral arterial tonometry (PAT), chest movement, actigraphy, pulse oximetry, pulse rate, body position and snore.  AHI was calculated with apnea and hypopnea using valid sleep time as the denominator. RDI includes apneas,  hypopneas, and RERAs. The data acquired and the scoring of sleep and all associated events were performed in  accordance with the recommended standards and specifications as outlined in the AASM Manual for the Scoring of  Sleep and Associated Events 2.2.0 (2015).   FINDINGS:   1. Mild Obstructive Sleep Apnea with AHI 12.6/hr.   2. No Central Sleep Apnea with pAHIc 0.7/hr.   3. Oxygen desaturations as low as 82%.   4. Severe snoring was present. O2 sats were < 88% for 6.7 min.   5. Total sleep time was 7 hrs and 23 min.   6. 28.9% of total sleep time was spent in REM sleep.   7. Normal sleep onset latency at 18 min.   8. Shortened REM sleep onset latency at 25 min.   9. Total awakenings were 10.  10. Arrhythmia detection: None  DIAGNOSIS: Mild Obstructive Sleep Apnea (G47.33) Nocturnal Hypoxemia  RECOMMENDATIONS: 1. Clinical correlation of these findings is necessary. The decision to treat obstructive sleep apnea (OSA) is usually  based on the presence of apnea symptoms or the presence of associated medical conditions such as Hypertension,  Congestive Heart Failure, Atrial Fibrillation or Obesity. The most common symptoms of OSA are snoring, gasping for  breath while sleeping, daytime sleepiness and fatigue.  2. Initiating apnea therapy is recommended given the presence of symptoms and/or associated conditions.  Recommend  proceeding with one of the following:  a. Auto-CPAP therapy with a pressure range of 5-20cm H2O.  b. An oral appliance (OA) that can be obtained from certain dentists with expertise in sleep medicine. These are  primarily of use in non-obese patients with mild and moderate disease.  c. An ENT consultation which may be useful to look for specific causes of obstruction and possible treatment  options.  d. If patient is intolerant to PAP therapy, consider referral to ENT for evaluation for hypoglossal nerve stimulator.  3. Close follow-up is necessary to ensure success with CPAP or oral appliance therapy for maximum benefit . 4. A follow-up oximetry study on CPAP is recommended to assess the adequacy of therapy and determine the need  for supplemental oxygen or the potential need for Bi-level therapy. An arterial blood gas to determine the adequacy of  baseline ventilation and oxygenation should also be considered. 5. Healthy sleep recommendations include: adequate nightly sleep (normal 7-9 hrs/night), avoidance of caffeine after  noon and alcohol near bedtime, and maintaining a sleep environment that is cool, dark and quiet. 6. Weight loss for overweight patients is recommended. Even modest amounts of weight loss can significantly  improve the severity of sleep apnea. 7. Snoring recommendations include: weight loss where appropriate, side sleeping, and avoidance of alcohol before  bed. 8. Operation of motor vehicle should be avoided when sleepy.  Signature: Wilbert Bihari, MD; [facility]; Diplomat, American Board of Sleep  Medicine Electronically Signed: 12/26/2023 4:00:59 PM

## 2023-12-26 NOTE — Addendum Note (Signed)
 Addended by: Zenaya Ulatowski S on: 12/26/2023 05:08 PM   Modules accepted: Orders

## 2023-12-31 ENCOUNTER — Encounter (HOSPITAL_BASED_OUTPATIENT_CLINIC_OR_DEPARTMENT_OTHER): Payer: Self-pay | Admitting: Family

## 2024-01-01 ENCOUNTER — Telehealth: Payer: Self-pay

## 2024-01-01 NOTE — Telephone Encounter (Signed)
-----   Message from Wilbert Bihari sent at 12/26/2023  4:02 PM EDT ----- Please let patient know that they have sleep apnea and recommend treating with CPAP.  Please order an auto CPAP from 4-15cm H2O with heated humidity and mask of choice.  Order overnight pulse ox on CPAP.  Followup with me in 6 weeks.

## 2024-01-01 NOTE — Telephone Encounter (Signed)
 Left VM with callback number for patient to receive sleep study results and recommendations.

## 2024-01-07 ENCOUNTER — Ambulatory Visit (HOSPITAL_BASED_OUTPATIENT_CLINIC_OR_DEPARTMENT_OTHER): Payer: Self-pay

## 2024-01-29 ENCOUNTER — Other Ambulatory Visit (HOSPITAL_BASED_OUTPATIENT_CLINIC_OR_DEPARTMENT_OTHER): Payer: Self-pay | Admitting: Family

## 2024-01-30 ENCOUNTER — Telehealth: Payer: Self-pay | Admitting: *Deleted

## 2024-01-30 DIAGNOSIS — G4730 Sleep apnea, unspecified: Secondary | ICD-10-CM

## 2024-01-30 DIAGNOSIS — G4733 Obstructive sleep apnea (adult) (pediatric): Secondary | ICD-10-CM

## 2024-01-30 DIAGNOSIS — I10 Essential (primary) hypertension: Secondary | ICD-10-CM

## 2024-01-30 DIAGNOSIS — R0683 Snoring: Secondary | ICD-10-CM

## 2024-01-30 NOTE — Telephone Encounter (Signed)
The patient has been notified of the result. Left detailed message on voicemail and informed patient to call back.

## 2024-01-30 NOTE — Telephone Encounter (Signed)
-----   Message from Wilbert Bihari sent at 12/26/2023  4:02 PM EDT ----- Please let patient know that they have sleep apnea and recommend treating with CPAP.  Please order an auto CPAP from 4-15cm H2O with heated humidity and mask of choice.  Order overnight pulse ox on CPAP.  Followup with me in 6 weeks.

## 2024-02-01 ENCOUNTER — Encounter: Payer: Self-pay | Admitting: Emergency Medicine

## 2024-02-01 ENCOUNTER — Ambulatory Visit: Admission: EM | Admit: 2024-02-01 | Discharge: 2024-02-01 | Disposition: A | Discharge status: Home / Self Care

## 2024-02-01 DIAGNOSIS — J4520 Mild intermittent asthma, uncomplicated: Secondary | ICD-10-CM | POA: Insufficient documentation

## 2024-02-01 DIAGNOSIS — K5904 Chronic idiopathic constipation: Secondary | ICD-10-CM | POA: Insufficient documentation

## 2024-02-01 DIAGNOSIS — M109 Gout, unspecified: Secondary | ICD-10-CM | POA: Diagnosis not present

## 2024-02-01 DIAGNOSIS — E559 Vitamin D deficiency, unspecified: Secondary | ICD-10-CM | POA: Insufficient documentation

## 2024-02-01 DIAGNOSIS — I10 Essential (primary) hypertension: Secondary | ICD-10-CM | POA: Insufficient documentation

## 2024-02-01 MED ORDER — PREDNISONE 20 MG PO TABS: 40.0000 mg | ORAL_TABLET | Freq: Every day | ORAL | 0 refills | Status: AC

## 2024-02-01 NOTE — ED Provider Notes (Signed)
 [facility]  Note:  This document was prepared using Conservation officer, historic buildings and may include unintentional dictation errors.  MRN: [id number] DOB: [birth year]  Subjective:   Jenna Stokes is a 58 y.o. female presenting for right great toe swelling and pain x 4 days.  The patient reports sharp throbbing pain that is constant to the right foot around the great toe.  Patient notes that she has no history of gout arthritis but does have family history of gout.  No distinct trauma or injury to the right foot or toe.  Patient has been taking Tylenol  with minimal improvement.  Increased pain with toe flexion, extension, ambulation  No current facility-administered medications for this encounter.  Current Outpatient Medications:    dapagliflozin  propanediol (FARXIGA ) 10 MG TABS tablet, Take 1 tablet (10 mg total) by mouth daily before breakfast., Disp: 30 tablet, Rfl: 2   ergocalciferol (VITAMIN D2) 1.25 MG (50000 UT) capsule, 1 capsule Orally, Disp: , Rfl:    isosorbide -hydrALAZINE  (BIDIL ) 20-37.5 MG tablet, TAKE 2 TABLETS IN THE MORNING AND 3 TABLETS IN THE EVENING, Disp: 450 tablet, Rfl: 3   linaclotide (LINZESS) 72 MCG capsule, Take 72 mcg by mouth daily before breakfast., Disp: , Rfl:    Olmesartan -amLODIPine -HCTZ 40-10-25 MG TABS, TAKE 1 TABLET BY MOUTH DAILY, Disp: 90 tablet, Rfl: 3   predniSONE  (DELTASONE ) 20 MG tablet, Take 2 tablets (40 mg total) by mouth daily for 5 days., Disp: 10 tablet, Rfl: 0   rosuvastatin  (CRESTOR ) 10 MG tablet, Take 1 tablet (10 mg total) by mouth daily., Disp: 90 tablet, Rfl: 3   tirzepatide  (MOUNJARO ) 7.5 MG/0.5ML Pen, Inject 7.5 mg into the skin once a week., Disp: 6 mL, Rfl: 0   AIRSUPRA 90-80 MCG/ACT AERO, Inhale 2 puffs into the lungs 4 (four) times daily. (Patient taking differently: Inhale 2 puffs into the lungs as needed.), Disp: , Rfl:    spironolactone  (ALDACTONE ) 100 MG tablet, 1 tablet Orally Once a day; Duration: 30 days (Patient  not taking: Reported on 02/01/2024), Disp: , Rfl:    VITAMIN D PO, Take 1 tablet by mouth daily. *pt doesn't know units*, Disp: , Rfl:    Allergies  Allergen Reactions   Penicillins Other (See Comments)    Unknown childhood reaction Has patient had a PCN reaction causing immediate rash, facial/tongue/throat swelling, SOB or lightheadedness with hypotension: Unknown Has patient had a PCN reaction causing severe rash involving mucus membranes or skin necrosis: Unknown Has patient had a PCN reaction that required hospitalization: Unknown Has patient had a PCN reaction occurring within the last 10 years: No If all of the above answers are NO, then may proceed with Cephalosporin use.    Dapagliflozin  Pro-Metformin  Er Diarrhea and Other (See Comments)   Metformin  Hcl Diarrhea and Other (See Comments)    Past Medical History:  Diagnosis Date   AKI (acute kidney injury) (HCC) 12/16/2020   DM type 2, uncontrolled, with renal complications 09/20/2013   Hyperlipidemia    Hypertension    Morbid obesity (HCC)      Past Surgical History:  Procedure Laterality Date   ABDOMINAL HYSTERECTOMY     CESAREAN SECTION     INCISION AND DRAINAGE ABSCESS Left 12/20/2020   Procedure: INCISION AND DRAINAGE ABSCESS;  Surgeon: Curvin Deward MOULD, MD;  Location: [facility];  Service: General;  Laterality: Left;   TONSILLECTOMY      Family History  Problem Relation Age of Onset   Heart disease Mother    Diabetes  Mother    Hypertension Sister    Diabetes Sister    Adrenal disorder Sister     Social History   Tobacco Use   Smoking status: Never    Passive exposure: Never   Smokeless tobacco: Never  Vaping Use   Vaping status: Never Used  Substance Use Topics   Alcohol use: No   Drug use: No    ROS Refer to HPI for ROS details.  Objective:   Vitals: BP (!) 141/70 (BP Location: Left Arm)   Pulse 74   Temp 98.2 F (36.8 C) (Oral)   Resp 16   LMP 08/10/2006   SpO2 95%   Physical Exam Vitals  and nursing note reviewed.  Constitutional:      General: She is not in acute distress.    Appearance: Normal appearance. She is well-developed. She is not ill-appearing or toxic-appearing.  HENT:     Head: Normocephalic and atraumatic.  Cardiovascular:     Rate and Rhythm: Normal rate.  Pulmonary:     Effort: Pulmonary effort is normal. No respiratory distress.  Musculoskeletal:     Right foot: Decreased range of motion. Normal capillary refill. Swelling, tenderness and bony tenderness present. No deformity or crepitus. Normal pulse.  Skin:    General: Skin is warm and dry.  Neurological:     General: No focal deficit present.     Mental Status: She is alert and oriented to person, place, and time.  Psychiatric:        Mood and Affect: Mood normal.        Behavior: Behavior normal.     Procedures  No results found for this or any previous visit (from the past 24 hours).  No results found.   Assessment and Plan :     Discharge Instructions       1. Gout, arthritis (Primary) - predniSONE  (DELTASONE ) 20 MG tablet; Take 2 tablets (40 mg total) by mouth daily for 5 days.  Dispense: 10 tablet; Refill: 0 - Apply ice to the toe 2-3 times a day for 10 to 15 minutes at a time to help with inflammation and pain. - Follow-up with primary care provider to discuss possible long-term care of gout or arthritis. - Continue to monitor symptoms for any change in severity if there is any escalation recurrent symptoms or development of new symptoms follow-up for further evaluation management.       Garek Schuneman B Alexi Geibel   Tomiko Schoon, [place], [place] 02/01/24 1017

## 2024-02-01 NOTE — ED Triage Notes (Addendum)
 Pt reports R foot pain x4 days. Describes sharp, throbbing pain constant in foot. Pain is focused in R great toe area and pt notes it is hard to move the toe. No injury or trauma to area. Notes family hx of gout. No personal hx. 1000mg  of tylenol  taken with no relief.

## 2024-02-01 NOTE — Discharge Instructions (Signed)
  1. Gout, arthritis (Primary) - predniSONE  (DELTASONE ) 20 MG tablet; Take 2 tablets (40 mg total) by mouth daily for 5 days.  Dispense: 10 tablet; Refill: 0 - Apply ice to the toe 2-3 times a day for 10 to 15 minutes at a time to help with inflammation and pain. - Follow-up with primary care provider to discuss possible long-term care of gout or arthritis. - Continue to monitor symptoms for any change in severity if there is any escalation recurrent symptoms or development of new symptoms follow-up for further evaluation management.

## 2024-02-03 NOTE — Telephone Encounter (Signed)
 The patient has been notified of the result and verbalized understanding.  All questions (if any) were answered. Jenna Stokes, CMA 02/03/2024 2:00 PM     Upon patient request DME selection is [facility]. Patient understands he will be contacted by [facility] to set up his cpap. Patient understands to call if [facility] does not contact him with new setup in a timely manner. Patient understands they will be called once confirmation has been received from [facility]/ that they have received their new machine to schedule 10 week follow up appointment.   [facility] notified of new cpap order  Please add to airview Patient was grateful for the call and thanked me.

## 2024-02-04 ENCOUNTER — Other Ambulatory Visit: Payer: Self-pay | Admitting: Nurse Practitioner

## 2024-02-04 DIAGNOSIS — E1169 Type 2 diabetes mellitus with other specified complication: Secondary | ICD-10-CM

## 2024-02-04 NOTE — Telephone Encounter (Signed)
 Please advise [facility]

## 2024-02-06 ENCOUNTER — Telehealth: Payer: Self-pay | Admitting: Cardiovascular Disease

## 2024-02-06 NOTE — Telephone Encounter (Signed)
  Pt c/o medication issue:  1. Name of Medication: Allopurinol   2. How are you currently taking this medication (dosage and times per day)?   3. Are you having a reaction (difficulty breathing--STAT)? No   4. What is your medication issue? Patient said she was diagnosed gout and was only given pain meds for it. She would like to ask Dr. Raford or Reche Finder if they can prescribed  Allopurinol and also labs to check her uric acid to know what dose she can take

## 2024-02-06 NOTE — Telephone Encounter (Signed)
 Left message for patient to call back

## 2024-02-10 NOTE — Telephone Encounter (Signed)
 Allopurinol and gout management will need to be done by her primary care provider.   Jenna Mangas S Kamerin Grumbine, NP

## 2024-02-11 NOTE — Telephone Encounter (Signed)
 Left message to call back and mychart message to patient

## 2024-02-26 NOTE — Telephone Encounter (Signed)
 Patient ready mychart message   Last read by [name] at 11:05AM on 02/11/2024.

## 2024-03-09 ENCOUNTER — Encounter: Payer: Self-pay | Admitting: Nurse Practitioner

## 2024-03-09 ENCOUNTER — Ambulatory Visit (INDEPENDENT_AMBULATORY_CARE_PROVIDER_SITE_OTHER): Admitting: Nurse Practitioner

## 2024-03-09 VITALS — BP 106/49 | HR 76 | Temp 96.8°F | Wt 352.0 lb

## 2024-03-09 DIAGNOSIS — E042 Nontoxic multinodular goiter: Secondary | ICD-10-CM | POA: Insufficient documentation

## 2024-03-09 DIAGNOSIS — M170 Bilateral primary osteoarthritis of knee: Secondary | ICD-10-CM | POA: Insufficient documentation

## 2024-03-09 DIAGNOSIS — I1A0 Resistant hypertension: Secondary | ICD-10-CM | POA: Diagnosis not present

## 2024-03-09 DIAGNOSIS — G4733 Obstructive sleep apnea (adult) (pediatric): Secondary | ICD-10-CM

## 2024-03-09 DIAGNOSIS — M109 Gout, unspecified: Secondary | ICD-10-CM | POA: Diagnosis not present

## 2024-03-09 DIAGNOSIS — E1169 Type 2 diabetes mellitus with other specified complication: Secondary | ICD-10-CM | POA: Diagnosis not present

## 2024-03-09 DIAGNOSIS — Z0000 Encounter for general adult medical examination without abnormal findings: Secondary | ICD-10-CM | POA: Insufficient documentation

## 2024-03-09 DIAGNOSIS — N1832 Chronic kidney disease, stage 3b: Secondary | ICD-10-CM | POA: Diagnosis not present

## 2024-03-09 DIAGNOSIS — I10 Essential (primary) hypertension: Secondary | ICD-10-CM

## 2024-03-09 DIAGNOSIS — E785 Hyperlipidemia, unspecified: Secondary | ICD-10-CM

## 2024-03-09 MED ORDER — MOUNJARO 10 MG/0.5ML ~~LOC~~ SOAJ: 10.0000 mg | SUBCUTANEOUS | 2 refills | Status: DC

## 2024-03-09 NOTE — Patient Instructions (Signed)
   type 2 diabetes mellitus (HCC)  - Lipid panel - Basic Metabolic Panel - tirzepatide  (MOUNJARO ) 10 MG/0.5ML Pen; Inject 10 mg into the skin once a week.  Dispense: 6 mL; Refill: 2    It is important that you exercise regularly at least 30 minutes 5 times a week as tolerated  Think about what you will eat, plan ahead. Choose  clean, green, fresh or frozen over canned, processed or packaged foods which are more sugary, salty and fatty. 70 to 75% of food eaten should be vegetables and fruit. Three meals at set times with snacks allowed between meals, but they must be fruit or vegetables. Aim to eat over a 12 hour period , example 7 am to 7 pm, and STOP after  your last meal of the day. Drink water,generally about 64 ounces per day, no other drink is as healthy. Fruit juice is best enjoyed in a healthy way, by EATING the fruit.  Thanks for choosing [facility] we consider it a privelige to serve you.

## 2024-03-09 NOTE — Assessment & Plan Note (Signed)
  CPAP therapy not initiated due to scheduling difficulties. - Encourage scheduling CPAP training and initiation.

## 2024-03-09 NOTE — Assessment & Plan Note (Addendum)
 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 Kidney function remains a concern. - Increase Tirzepatide  (Mounjaro ) to 10 mg, continue Farxiga  10 mg daily - Monitor blood sugar for hypoglycemia. - Order BMP for kidney function. - Advise hydration and NSAID avoidance. Patient counseled on low-carb diet Not due for A1c she prefers to have her A1c repeated at her next appointment with cardiology   Hyperlipidemia LDL not at target; previous LDL 138 mg/dL. Goal LDL <70 mg/dL. - Order lipid panel. On Crestor  10 mg daily

## 2024-03-09 NOTE — Assessment & Plan Note (Addendum)
  BP Readings from Last 3 Encounters:  03/09/24 (!) 106/49  02/01/24 (!) 141/70  12/25/23 130/70  Currently denies shortness of breath, dizziness Continue olmesartan -amlodipine -HCT TZ 40 - 10 - 25 mg 1 tablet daily, isosorbide  hydralazine  20-37.5 mg, 2 tablets in the morning and 3 tablets in the evening Maintain close follow-up with cardiology

## 2024-03-09 NOTE — Assessment & Plan Note (Signed)
  Bone-on-bone contact causing pain. Surgery delayed due to high BMI. - Continue current management and weight loss efforts.

## 2024-03-09 NOTE — Assessment & Plan Note (Signed)
-   Refer for annual diabetic eye exam. - Discuss colon cancer screening options, she is  not interested  - Encourage flu shot through work.

## 2024-03-09 NOTE — Progress Notes (Addendum)
 Acute Office Visit  Subjective:     Patient ID: Jenna Stokes, female    DOB: 1966-02-15, 58 y.o.   MRN: [id number]  Chief Complaint  Patient presents with   Gout    HPI Patient is in today for   Discussed the use of AI scribe software for clinical note transcription with the patient, who gave verbal consent to proceed.  History of Present Illness Jenna Stokes is a 58 year old female  has a past medical history of AKI (acute kidney injury) (12/16/2020), DM type 2, uncontrolled, with renal complications (09/20/2013), Gout, arthritis (03/09/2024), Hyperlipidemia, Hypertension, and Morbid obesity (HCC).  with diabetes and hyperlipidemia who presents for follow-up for her chronic medical conditions  She experienced pain in her right big toe, leading to an ER visit on August 31, where it was diagnosed as likely gout. She was treated with prednisone , and the pain has since resolved. This was her first episode of gout.  Her most recent A1c was 6.6% in July. She has been on Mounjaro  7 mg since June 3 but feels it is not effective. No episodes of hypoglycemia, abdominal pain, nausea, or vomiting. She monitors her blood sugar as needed.  She takes Crestor  10mg  daily  for, hyperlipidemia and the dose was increased in August. Her last LDL was 138 mg/dL, and she is due for a repeat test.  She has a history of knee pain due to osteoarthritis, described as 'bone to bone' in both knees for the past five years. Her current weight is 352 lbs, with a history of fluctuations. She attempts to follow dietary advice from a nutritionist and engages in exercise as tolerated.  She has not yet obtained a CPAP machine for her sleep apnea due to scheduling difficulties.  She has not seen an eye doctor in the past year and does not have a current provider for eye care. She plans to receive a flu shot through her workplace. She has not undergone colon cancer screening and is not interested at this time.     Assessment & Plan     Review of Systems  Constitutional:  Negative for appetite change, chills, fatigue and fever.  HENT:  Negative for congestion, postnasal drip, rhinorrhea and sneezing.   Respiratory:  Negative for cough, shortness of breath and wheezing.   Cardiovascular:  Negative for chest pain, palpitations and leg swelling.  Gastrointestinal:  Negative for abdominal pain, constipation, nausea and vomiting.  Genitourinary:  Negative for difficulty urinating, dysuria, flank pain and frequency.  Musculoskeletal:  Positive for arthralgias. Negative for back pain, joint swelling and myalgias.  Skin:  Negative for color change, pallor, rash and wound.  Neurological:  Negative for facial asymmetry, weakness, numbness and headaches.  Psychiatric/Behavioral:  Negative for behavioral problems, confusion, self-injury and suicidal ideas.         Objective:    BP (!) 106/49   Pulse 76   Temp (!) 96.8 F (36 C)   Wt (!) 352 lb (159.7 kg)   LMP 08/10/2006   SpO2 95%   BMI 50.51 kg/m    Physical Exam Vitals and nursing note reviewed.  Constitutional:      General: She is not in acute distress.    Appearance: Normal appearance. She is obese. She is not ill-appearing, toxic-appearing or diaphoretic.  Cardiovascular:     Rate and Rhythm: Normal rate and regular rhythm.     Pulses: Normal pulses.     Heart sounds: Normal heart sounds.  No murmur heard.    No friction rub. No gallop.  Pulmonary:     Effort: Pulmonary effort is normal. No respiratory distress.     Breath sounds: Normal breath sounds. No stridor. No wheezing, rhonchi or rales.  Chest:     Chest wall: No tenderness.  Abdominal:     General: There is no distension.     Palpations: Abdomen is soft.     Tenderness: There is no abdominal tenderness. There is no right CVA tenderness, left CVA tenderness or guarding.  Musculoskeletal:        General: No swelling, tenderness, deformity or signs of injury.     Right  lower leg: Edema present.     Left lower leg: Edema present.  Skin:    General: Skin is warm and dry.     Capillary Refill: Capillary refill takes less than 2 seconds.     Coloration: Skin is not jaundiced or pale.     Findings: No bruising, erythema or lesion.  Neurological:     Mental Status: She is alert and oriented to person, place, and time.     Motor: No weakness.     Coordination: Coordination normal.     Gait: Gait normal.  Psychiatric:        Mood and Affect: Mood normal.        Behavior: Behavior normal.        Thought Content: Thought content normal.        Judgment: Judgment normal.     No results found for any visits on 03/09/24.      Assessment & Plan:   Problem List Items Addressed This Visit       Cardiovascular and Mediastinum   Resistant hypertension   Relevant Orders   Basic Metabolic Panel   Essential hypertension     BP Readings from Last 3 Encounters:  03/09/24 (!) 106/49  02/01/24 (!) 141/70  12/25/23 130/70  Currently denies shortness of breath, dizziness Continue olmesartan -amlodipine -HCT TZ 40 - 10 - 25 mg 1 tablet daily, isosorbide  hydralazine  20-37.5 mg, 2 tablets in the morning and 3 tablets in the evening Maintain close follow-up with cardiology            Respiratory   OSA (obstructive sleep apnea)    CPAP therapy not initiated due to scheduling difficulties. - Encourage scheduling CPAP training and initiation.          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 Kidney function remains a concern. - Increase Tirzepatide  (Mounjaro ) to 10 mg, continue Farxiga  10 mg daily - Monitor blood sugar for hypoglycemia. - Order BMP for kidney function. - Advise hydration and NSAID avoidance. Patient  counseled on low-carb diet Not due for A1c she prefers to have her A1c repeated at her next appointment with cardiology   Hyperlipidemia LDL not at target; previous LDL 138 mg/dL. Goal LDL <70 mg/dL. - Order lipid panel. On Crestor  10 mg daily        Relevant Medications   tirzepatide  (MOUNJARO ) 10 MG/0.5ML Pen   Other Relevant Orders   Lipid panel   Basic Metabolic Panel   Ambulatory referral to Ophthalmology   Multinodular goiter   Multinodular goiter Normal TSH and T4 on 07/09/2023, thyroid  ultrasound done in  2023  showed thyroid   thyroid  nodules with recommendation for repeat in 1 year, I discussed repeating the ultrasound as recommended but the patient declined.               Musculoskeletal and Integument   Gout, arthritis   History of gout, right great toe Previous episode treated with prednisone . No current pain. - Order uric acid level. Since this is her first episode I dicussed interventions to reduce the risk of having another gout attack Checking uric acid level to determine if stating allopurinol for gout prevention is needed at this time       Relevant Orders   Uric Acid   Osteoarthritis of both knees    Bone-on-bone contact causing pain. Surgery delayed due to high BMI. - Continue current management and weight loss efforts.         Genitourinary   CKD stage 3b, GFR 30-44 ml/min (HCC) - Primary   Lab Results  Component Value Date   NA 143 12/25/2023   K 3.8 12/25/2023   CO2 22 12/25/2023   GLUCOSE 114 (H) 12/25/2023   BUN 30 (H) 12/25/2023   CREATININE 1.87 (H) 12/25/2023   CALCIUM  10.8 (H) 12/25/2023   GFR 35.28 (L) 08/31/2021   EGFR 31 (L) 12/25/2023   GFRNONAA 43 (L) 07/13/2023  Maintain hydration, avoid NSAIDs and other nephrotoxic agents Continue Farxiga  10 mg daily Patient referred to nephrologist Checking BMP      Relevant Orders   Ambulatory referral to Nephrology     Other   Morbid obesity (HCC)   Wt Readings from Last 3  Encounters:  03/09/24 (!) 352 lb (159.7 kg)  12/25/23 (!) 345 lb (156.5 kg)  10/23/23 (!) 358 lb 9.6 oz (162.7 kg)   Body mass index is 50.51 kg/m.  Weight 352 lbs, high BMI affects knee surgery eligibility. Managed with diet, exercise, and Tirzepatide . - Increase Tirzepatide  (Mounjaro ) to 10 mg. - Encourage diet rich in vegetables and protein, low in carbohydrates. - Recommend exercise, e.g., fast walking 30 min, five days a week.       Relevant Medications   tirzepatide  (MOUNJARO ) 10 MG/0.5ML Pen   Health care maintenance   - Refer for annual diabetic eye exam. - Discuss colon cancer screening options, she is  not interested  - Encourage flu shot through work.       Meds ordered this encounter  Medications   tirzepatide  (MOUNJARO ) 10 MG/0.5ML Pen    Sig: Inject 10 mg into the skin once a week.    Dispense:  6 mL    Refill:  2    Return in about 4 months (around 07/10/2024) for HTN, HYPERLIPIDEMIA.  Kalasia Crafton R Corinda Ammon, FNP

## 2024-03-09 NOTE — Assessment & Plan Note (Signed)
 Wt Readings from Last 3 Encounters:  03/09/24 (!) 352 lb (159.7 kg)  12/25/23 (!) 345 lb (156.5 kg)  10/23/23 (!) 358 lb 9.6 oz (162.7 kg)   Body mass index is 50.51 kg/m.  Weight 352 lbs, high BMI affects knee surgery eligibility. Managed with diet, exercise, and Tirzepatide . - Increase Tirzepatide  (Mounjaro ) to 10 mg. - Encourage diet rich in vegetables and protein, low in carbohydrates. - Recommend exercise, e.g., fast walking 30 min, five days a week.

## 2024-03-09 NOTE — Assessment & Plan Note (Signed)
 Lab Results  Component Value Date   NA 143 12/25/2023   K 3.8 12/25/2023   CO2 22 12/25/2023   GLUCOSE 114 (H) 12/25/2023   BUN 30 (H) 12/25/2023   CREATININE 1.87 (H) 12/25/2023   CALCIUM  10.8 (H) 12/25/2023   GFR 35.28 (L) 08/31/2021   EGFR 31 (L) 12/25/2023   GFRNONAA 43 (L) 07/13/2023  Maintain hydration, avoid NSAIDs and other nephrotoxic agents Continue Farxiga  10 mg daily Patient referred to nephrologist Checking BMP

## 2024-03-09 NOTE — Assessment & Plan Note (Signed)
 Multinodular goiter Normal TSH and T4 on 07/09/2023, thyroid  ultrasound done in 2023  showed thyroid   thyroid  nodules with recommendation for repeat in 1 year, I discussed repeating the ultrasound as recommended but the patient declined.

## 2024-03-09 NOTE — Assessment & Plan Note (Signed)
 History of gout, right great toe Previous episode treated with prednisone . No current pain. - Order uric acid level. Since this is her first episode I dicussed interventions to reduce the risk of having another gout attack Checking uric acid level to determine if stating allopurinol for gout prevention is needed at this time

## 2024-03-10 ENCOUNTER — Ambulatory Visit: Payer: Self-pay | Admitting: Nurse Practitioner

## 2024-03-10 DIAGNOSIS — M109 Gout, unspecified: Secondary | ICD-10-CM

## 2024-03-10 DIAGNOSIS — E782 Mixed hyperlipidemia: Secondary | ICD-10-CM

## 2024-03-10 LAB — BASIC METABOLIC PANEL WITH GFR
BUN/Creatinine Ratio: 19 (ref 9–23)
BUN: 34 mg/dL — ABNORMAL HIGH (ref 6–24)
CO2: 22 mmol/L (ref 20–29)
Calcium: 10.2 mg/dL (ref 8.7–10.2)
Chloride: 100 mmol/L (ref 96–106)
Creatinine, Ser: 1.77 mg/dL — ABNORMAL HIGH (ref 0.57–1.00)
Glucose: 210 mg/dL — ABNORMAL HIGH (ref 70–99)
Potassium: 3.9 mmol/L (ref 3.5–5.2)
Sodium: 137 mmol/L (ref 134–144)
eGFR: 33 mL/min/1.73 — ABNORMAL LOW (ref >59)

## 2024-03-10 LAB — LIPID PANEL
Chol/HDL Ratio: 5 ratio — ABNORMAL HIGH (ref 0.0–4.4)
Cholesterol, Total: 227 mg/dL — ABNORMAL HIGH (ref 100–199)
HDL: 45 mg/dL (ref >39)
LDL Chol Calc (NIH): 125 mg/dL — ABNORMAL HIGH (ref 0–99)
Triglycerides: 323 mg/dL — ABNORMAL HIGH (ref 0–149)
VLDL Cholesterol Cal: 57 mg/dL — ABNORMAL HIGH (ref 5–40)

## 2024-03-10 LAB — URIC ACID: Uric Acid: 10.1 mg/dL — ABNORMAL HIGH (ref 3.0–7.2)

## 2024-03-10 MED ORDER — ATORVASTATIN CALCIUM 40 MG PO TABS: 40.0000 mg | ORAL_TABLET | Freq: Every day | ORAL | 1 refills | Status: DC

## 2024-03-10 MED ORDER — ALLOPURINOL 100 MG PO TABS: 50.0000 mg | ORAL_TABLET | Freq: Every day | ORAL | 1 refills | Status: AC

## 2024-03-10 NOTE — Telephone Encounter (Signed)
 Will forward to provider

## 2024-03-11 ENCOUNTER — Telehealth: Payer: Self-pay | Admitting: Cardiovascular Disease

## 2024-03-11 NOTE — Telephone Encounter (Signed)
Letter done [facility]

## 2024-03-11 NOTE — Telephone Encounter (Signed)
 Lmtc 10/9

## 2024-03-11 NOTE — Telephone Encounter (Signed)
 Left message to call back.

## 2024-03-11 NOTE — Telephone Encounter (Signed)
  Per MyChart scheduling message:  Pt c/o BP issue: STAT if pt c/o blurred vision, one-sided weakness or slurred speech.  STAT if BP is GREATER than 180/120 TODAY.  STAT if BP is LESS than 90/60 and SYMPTOMATIC TODAY  1. What is your BP concern?   2. Have you taken any BP medication today?  3. What are your last 5 BP readings?  4. Are you having any other symptoms (ex. Dizziness, headache, blurred vision, passed out)?    1 . 115/48 2.  Yes 3.115/48...111/48..106/49...125/63...144/72 4. Yes .SABRAHeadaches  lil fatigue .SABRA Have taken Tylenol   for pass two days .

## 2024-03-12 ENCOUNTER — Telehealth: Payer: Self-pay | Admitting: Cardiovascular Disease

## 2024-03-12 NOTE — Telephone Encounter (Signed)
 Those BP readings as you told her are actually at goal <130/80. Would not expect her antihypertensive agents nor a controlled blood pressure to contribute to headache. Sh emay use Tylenol  PRN but would recommend follow up with primary care regarding headache.   If she is feeling lightheaded or poorly with relatively low BP, she can reduce her Bidil  to 2 tablets BID (previously taking 2 tab AM and 3 tab PM) and report back BP readings in one week.   Unfortunately her OV with Dr. Raford is the soonest ADV HTN slot available, can ask Jonette to put her on wait list if she would like.   Bennett Ram S Aslan Montagna, NP

## 2024-03-12 NOTE — Telephone Encounter (Signed)
 Returned call to patient and reviewed comments from APP.  Pt is going to reduce BiDil  to 2 tabs BID, due to fatigue, record BPs and write in next week with a list. I adv her to reach out to PCP re: headaches.

## 2024-03-12 NOTE — Telephone Encounter (Signed)
 Pt c/o BP issue: STAT if pt c/o blurred vision, one-sided weakness or slurred speech.  STAT if BP is GREATER than 180/120 TODAY.  STAT if BP is LESS than 90/60 and SYMPTOMATIC TODAY  1. What is your BP concern? hypotension  2. Have you taken any BP medication today?pt did not state   3. What are your last 5 BP readings?104/49, 116/52,   4. Are you having any other symptoms (ex. Dizziness, headache, blurred vision, passed out)? Headache for three days    Pt states she was told to call back today between 8-4:30 pm  Pt is requesting an appt please advise

## 2024-03-12 NOTE — Telephone Encounter (Signed)
 Patient requesting appointment with Reche Finder, NP. BP has been extremely low and migraine headaches.   BP readings reported as noted in first message.  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   Today: 119/46.   Reviewed that her blood pressures are in normal range and that its possible she could be feeling a little fatigue if pressure dropped a little suddenly, but otherwise fatigue and migraine may be from something else.  Reviewed that her glucose in labs from IM on 10/7 was 210 and possibly its blood sugar but her home readings are 110-130s.    She has been off work due to her symptoms and hopes to return Monday after resting this weekend.  I offered her sooner follow up with CW (10/13) but she is concerned of being out of work again for it.   I adv I will forward to APP for recommendations.  Per patient okay for us  to call back or send reply through the portal.    Reviewed medication list w her.  Taking all as prescribed but spironolactone .

## 2024-03-12 NOTE — Telephone Encounter (Signed)
 Duplicate encounter. Addressed via phone note dated 03/11/24.   Jenna Stokes S Govind Furey, NP

## 2024-03-24 NOTE — Telephone Encounter (Signed)
 DME Youcalled patient on 02/10/24 to set her up for scheduling. Patient called dme on 02/12/24 and stated she would call them back for scheduling.  Order was not voided until 9/17.  She call intake at [phone].

## 2024-04-08 ENCOUNTER — Other Ambulatory Visit: Payer: Self-pay

## 2024-04-09 ENCOUNTER — Other Ambulatory Visit: Payer: Self-pay

## 2024-04-09 ENCOUNTER — Other Ambulatory Visit: Payer: Self-pay | Admitting: Nurse Practitioner

## 2024-04-09 DIAGNOSIS — E1169 Type 2 diabetes mellitus with other specified complication: Secondary | ICD-10-CM

## 2024-04-09 MED ORDER — TIRZEPATIDE 7.5 MG/0.5ML ~~LOC~~ SOAJ: 7.5000 mg | SUBCUTANEOUS | 0 refills | Status: DC

## 2024-04-20 ENCOUNTER — Other Ambulatory Visit (HOSPITAL_BASED_OUTPATIENT_CLINIC_OR_DEPARTMENT_OTHER): Payer: Self-pay

## 2024-04-21 ENCOUNTER — Ambulatory Visit (INDEPENDENT_AMBULATORY_CARE_PROVIDER_SITE_OTHER): Admitting: Cardiovascular Disease

## 2024-04-21 ENCOUNTER — Encounter (HOSPITAL_BASED_OUTPATIENT_CLINIC_OR_DEPARTMENT_OTHER): Payer: Self-pay | Admitting: *Deleted

## 2024-04-21 ENCOUNTER — Encounter (HOSPITAL_BASED_OUTPATIENT_CLINIC_OR_DEPARTMENT_OTHER): Payer: Self-pay | Admitting: Cardiovascular Disease

## 2024-04-21 ENCOUNTER — Other Ambulatory Visit (HOSPITAL_BASED_OUTPATIENT_CLINIC_OR_DEPARTMENT_OTHER): Payer: Self-pay

## 2024-04-21 VITALS — BP 114/56 | HR 75 | Ht 70.0 in | Wt 342.4 lb

## 2024-04-21 DIAGNOSIS — G4733 Obstructive sleep apnea (adult) (pediatric): Secondary | ICD-10-CM

## 2024-04-21 DIAGNOSIS — I1A0 Resistant hypertension: Secondary | ICD-10-CM

## 2024-04-21 DIAGNOSIS — N1832 Chronic kidney disease, stage 3b: Secondary | ICD-10-CM

## 2024-04-21 DIAGNOSIS — E1159 Type 2 diabetes mellitus with other circulatory complications: Secondary | ICD-10-CM

## 2024-04-21 DIAGNOSIS — E1165 Type 2 diabetes mellitus with hyperglycemia: Secondary | ICD-10-CM

## 2024-04-21 DIAGNOSIS — I152 Hypertension secondary to endocrine disorders: Secondary | ICD-10-CM

## 2024-04-21 LAB — HEMOGLOBIN A1C
Est. average glucose Bld gHb Est-mCnc: 154 mg/dL
Hgb A1c MFr Bld: 7 % — ABNORMAL HIGH (ref 4.8–5.6)

## 2024-04-21 MED ORDER — FLUZONE 0.5 ML IM SUSY
0.5000 mL | PREFILLED_SYRINGE | Freq: Once | INTRAMUSCULAR | 0 refills | Status: AC
  Filled 2024-04-21: qty 0.5, 1d supply, fill #0

## 2024-04-21 MED ORDER — LINACLOTIDE 72 MCG PO CAPS: 72.0000 ug | ORAL_CAPSULE | Freq: Every day | ORAL | 0 refills | Status: AC

## 2024-04-21 NOTE — Progress Notes (Signed)
 Hypertension Clinic Follow Up:    Date:  04/21/2024   ID:  Jenna Stokes, DOB [birth year], MRN [id number]  PCP:  Paseda, Folashade R, FNP  Cardiologist:  Aleene Passe, MD (Inactive)   Referring MD: Paseda, Folashade R, FNP   CC: Hypertension  History of Present Illness:    Jenna Stokes is a 58 y.o. female with a hx of resistant hypertension, HFpEF, diabetes, OSA, CKD 3B, hyperlipidemia, COPD, and morbid obesity here for follow-up.  She first establish care in the advanced hypertension clinic with Reche Finder, NP on 07/2023.  She was admitted earlier that month with hypertensive urgency.  She was also treated for COPD.  Echo at the time revealed LVEF 70-75% with moderate LVH and mild MR.  She was discharged on irbesartan , amlodipine , BiDil , and Lasix .  At that time she was transition to Tribenzor and BiDil .  Labs were negative for hyperaldosteronism.  Sleep study was ordered but not performed due to insurance issues.  She followed up with [facility] 07/2023 and blood pressures were in the 140s to 150s.  Spironolactone  was added to her regimen.  However it was discontinued due to worsening renal function.  She was admitted 06/2023 with hypertensive emergency.  Systolic blood pressure was over 225.  At her visit 10/2023 home blood pressures had improved to the 130s to 140s.  She had been recommended to start spironolactone  but did not do it due to her kidney function.  She was losing weight successfully on Mounjaro .  BiDil  was increased and she was started on Farxiga .  She was tested and found to have sleep apnea.  She followed up with Reche Finder, NP 12/2023 and blood pressure was better controlled.  She had not started Farxiga .  Discussed the use of AI scribe software for clinical note transcription with the patient, who gave verbal consent to proceed.  History of Present Illness Jenna Stokes notes that her blood pressure, historically elevated, has decreased to around 114 mmHg over the past  two months, coinciding with increased stress at work. Despite the lower readings, she generally feels well but is concerned about the change.  She experiences panic attacks with difficulty breathing and a sensation of not being able to catch her breath. 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 This situation has led to frustration and emotional distress, impacting her quality of life and interactions at home.  She has difficulty sleeping, typically falling asleep around 7 PM but waking up at midnight and struggling to return to sleep until 3 AM. She uses a CPAP machine but reports issues with the mask not fitting properly.  Her current blood pressure readings are generally between 120-125 mmHg, and she notes occasional lightheadedness upon standing. She has lost approximately 10 pounds recently, going from a size 28 to a size 22 in clothing.  She does not engage in regular exercise outside of work due to fatigue and early bedtimes. She has not sought therapy or medication for stress or anxiety, expressing reluctance to pursue these options.  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 (Patient taking differently: Inhale 2 puffs into the lungs as needed.)    allopurinol  (ZYLOPRIM ) 100 MG tablet Take 0.5 tablets (50 mg total) by mouth daily.   atorvastatin  (LIPITOR) 40 MG tablet Take 1 tablet (40 mg total) by mouth daily.   dapagliflozin  propanediol (FARXIGA ) 10 MG TABS tablet Take 1 tablet (10 mg total) by mouth daily before breakfast.   ergocalciferol (VITAMIN D2) 1.25 MG (50000 UT) capsule 1 capsule Orally   isosorbide -hydrALAZINE  (BIDIL ) 20-37.5 MG tablet TAKE 2 TABLETS IN THE MORNING AND 3 TABLETS IN THE EVENING (Patient taking differently: Take 2 tablets by mouth in the morning and at bedtime. TAKE 2 TABLETS IN THE MORNING AND 3 TABLETS IN THE EVENING)   Olmesartan -amLODIPine -HCTZ 40-10-25 MG TABS TAKE 1 TABLET BY MOUTH DAILY   tirzepatide  (MOUNJARO ) 7.5 MG/0.5ML Pen Inject 7.5 mg into the skin once a week.   VITAMIN D PO Take 1 tablet by mouth daily. *pt doesn't know units*   [DISCONTINUED] linaclotide  (LINZESS ) 72 MCG capsule Take 72 mcg by mouth daily before breakfast.     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      Home Situation:       Spiritual Beliefs:       Lifestyle: no regular exercise; diet is so so            Social Drivers of Corporate Investment Banker Strain: Not on file  Food Insecurity: No Food Insecurity (07/09/2023)   Hunger Vital Sign    Worried About Running Out of Food in the Last Year: Never true    Ran Out of Food  in the Last Year: Never true  Transportation Needs: No Transportation Needs (07/09/2023)   PRAPARE - Administrator, Civil Service (Medical): No    Lack of Transportation (Non-Medical): No  Physical Activity: Inactive (01/12/2018)   Exercise Vital Sign    Days of Exercise per Week: 0 days    Minutes of Exercise per Session: 0 min  Stress: No Stress Concern Present (01/12/2018)   [employer] of Occupational Health - Occupational Stress Questionnaire    Feeling of Stress : Only a little  Social Connections: Not on file     Family History: The patient's family history includes Adrenal disorder in her sister; Diabetes in her mother and sister; Heart disease in her mother; Hypertension in her sister.  ROS:  Please see the history of present illness.     All other systems reviewed and are negative.  EKGs/Labs/Other Studies Reviewed:    EKG:  EKG is not ordered today.   Recent Labs: 07/08/2023: B Natriuretic Peptide 60.9 07/09/2023: Magnesium  2.3; TSH 0.527 07/12/2023: Hemoglobin 12.0; Platelets 234 12/25/2023: ALT 26 03/09/2024: BUN 34; Creatinine, Ser 1.77; Potassium 3.9; Sodium 137   Recent Lipid Panel    Component Value Date/Time   CHOL 227 (H) 03/09/2024 1210   TRIG 323 (H) 03/09/2024 1210   HDL 45 03/09/2024 1210   CHOLHDL 5.0 (H) 03/09/2024 1210   CHOLHDL 5.0 07/09/2023 1402   VLDL 35 07/09/2023 1402   LDLCALC 125 (H) 03/09/2024 1210   LDLDIRECT 107.0 08/31/2021 1036    Physical Exam:    VS:  BP (!) 114/56   Pulse 75   Ht 5' 10 (1.778 m)   Wt (!) 342 lb 6.4 oz (155.3 kg)   LMP 08/10/2006   SpO2 97%   BMI 49.13 kg/m  , BMI Body mass index is 49.13 kg/m. GENERAL:  Well appearing HEENT: Pupils equal round and reactive, fundi not visualized, oral mucosa unremarkable NECK:  No jugular venous distention, waveform within normal limits, carotid upstroke brisk and symmetric, no bruits, no thyromegaly LUNGS:  Clear to auscultation bilaterally HEART:  RRR.   PMI not displaced or sustained,S1 and S2 within normal limits, no S3, no S4, no clicks, no rubs, no murmurs ABD:  Flat, positive bowel sounds normal in frequency in pitch, no bruits, no rebound, no guarding, no midline pulsatile mass, no hepatomegaly, no splenomegaly EXT:  2 plus pulses throughout, no edema, no cyanosis no clubbing SKIN:  No rashes no nodules NEURO:  Cranial nerves II through XII grossly intact, motor grossly intact throughout PSYCH:  Cognitively intact, oriented to person place and time   ASSESSMENT/PLAN:    Assessment & Plan Resistant hypertension Blood pressure is now well-controlled, averaging 120-125 mmHg systolic, occasional lows to 104 mmHg. No hypotension symptoms. Weight loss may aid control. - Continue current antihypertensive regimen. - Monitor blood pressure regularly. - Reassess blood pressure control in six months.  # Anxiety and stress related to work Significant work-related stress and anxiety, including panic attacks. Not open to therapy or medication at this time. Considering mental health leave.  Recommend that she consider therapy and follow up with her PCP regarding this.  - Encouraged stress management techniques. - Encouraged her to consider alternative work options.  # Obstructive sleep apnea Issues with CPAP mask fit causing discomfort and sleep difficulty. - Contact CPAP provider to address mask fit issues and obtain a new face mask.  # Morbid obesity:  Continue Mounjaro .  Encourage to find time for exercise.   Disposition:    F/u in 6 months   Medication Adjustments/Labs and Tests Ordered: Current medicines are reviewed at length with the patient today.  Concerns regarding medicines are outlined above.  Orders Placed This Encounter  Procedures   HgB A1c   Meds ordered this encounter  Medications   linaclotide (LINZESS) 72 MCG capsule    Sig: Take 1 capsule (72 mcg total) by mouth daily before breakfast. Additional refills from PCP  or gastroenterologist    Dispense:  30 capsule    Refill:  0     Signed, Annabella Scarce, MD  04/21/2024 1:04 PM    [facility]

## 2024-04-21 NOTE — Patient Instructions (Signed)
 Medication Instructions:  Your physician recommends that you continue on your current medications as directed. Please refer to the Current Medication list given to you today.   Labwork: NONE  Testing/Procedures: NONE  Follow-Up: 6 MONTHS WITH CAITLIN W NP OR DR [place]   If you need a refill on your cardiac medications before your next appointment, please call your pharmacy.

## 2024-05-01 ENCOUNTER — Ambulatory Visit: Payer: Self-pay | Admitting: Cardiovascular Disease

## 2024-05-20 LAB — OPHTHALMOLOGY REPORT-SCANNED

## 2024-06-02 ENCOUNTER — Encounter: Payer: Self-pay | Admitting: Nephrology

## 2024-06-02 ENCOUNTER — Other Ambulatory Visit: Payer: Self-pay | Admitting: Nephrology

## 2024-06-02 DIAGNOSIS — N1832 Chronic kidney disease, stage 3b: Secondary | ICD-10-CM

## 2024-06-18 ENCOUNTER — Encounter (HOSPITAL_COMMUNITY): Payer: Self-pay

## 2024-06-18 ENCOUNTER — Other Ambulatory Visit: Payer: Self-pay

## 2024-06-18 ENCOUNTER — Emergency Department (HOSPITAL_COMMUNITY)
Admission: EM | Admit: 2024-06-18 | Discharge: 2024-06-18 | Disposition: A | Discharge status: Home / Self Care | Attending: Emergency Medicine | Admitting: Emergency Medicine

## 2024-06-18 DIAGNOSIS — J45909 Unspecified asthma, uncomplicated: Secondary | ICD-10-CM | POA: Insufficient documentation

## 2024-06-18 DIAGNOSIS — N1832 Chronic kidney disease, stage 3b: Secondary | ICD-10-CM | POA: Insufficient documentation

## 2024-06-18 DIAGNOSIS — R55 Syncope and collapse: Secondary | ICD-10-CM | POA: Insufficient documentation

## 2024-06-18 DIAGNOSIS — I129 Hypertensive chronic kidney disease with stage 1 through stage 4 chronic kidney disease, or unspecified chronic kidney disease: Secondary | ICD-10-CM | POA: Diagnosis not present

## 2024-06-18 DIAGNOSIS — Z79899 Other long term (current) drug therapy: Secondary | ICD-10-CM | POA: Diagnosis not present

## 2024-06-18 DIAGNOSIS — R0789 Other chest pain: Secondary | ICD-10-CM | POA: Insufficient documentation

## 2024-06-18 DIAGNOSIS — E1122 Type 2 diabetes mellitus with diabetic chronic kidney disease: Secondary | ICD-10-CM | POA: Diagnosis not present

## 2024-06-18 LAB — COMPREHENSIVE METABOLIC PANEL WITH GFR
ALT: 27 U/L (ref 0–44)
AST: 27 U/L (ref 15–41)
Albumin: 4 g/dL (ref 3.5–5.0)
Alkaline Phosphatase: 82 U/L (ref 38–126)
Anion gap: 11 (ref 5–15)
BUN: 32 mg/dL — ABNORMAL HIGH (ref 6–20)
CO2: 25 mmol/L (ref 22–32)
Calcium: 10.2 mg/dL (ref 8.9–10.3)
Chloride: 103 mmol/L (ref 98–111)
Creatinine, Ser: 1.72 mg/dL — ABNORMAL HIGH (ref 0.44–1.00)
GFR, Estimated: 34 mL/min — ABNORMAL LOW
Glucose, Bld: 150 mg/dL — ABNORMAL HIGH (ref 70–99)
Potassium: 3.1 mmol/L — ABNORMAL LOW (ref 3.5–5.1)
Sodium: 140 mmol/L (ref 135–145)
Total Bilirubin: 0.3 mg/dL (ref 0.0–1.2)
Total Protein: 6.8 g/dL (ref 6.5–8.1)

## 2024-06-18 LAB — TROPONIN T, HIGH SENSITIVITY
Troponin T High Sensitivity: 15 ng/L (ref 0–19)
Troponin T High Sensitivity: 17 ng/L (ref 0–19)

## 2024-06-18 LAB — CBC
HCT: 36.9 % (ref 36.0–46.0)
Hemoglobin: 12.3 g/dL (ref 12.0–15.0)
MCH: 29.6 pg (ref 26.0–34.0)
MCHC: 33.3 g/dL (ref 30.0–36.0)
MCV: 88.7 fL (ref 80.0–100.0)
Platelets: 214 K/uL (ref 150–400)
RBC: 4.16 MIL/uL (ref 3.87–5.11)
RDW: 12.6 % (ref 11.5–15.5)
WBC: 8.8 K/uL (ref 4.0–10.5)
nRBC: 0 % (ref 0.0–0.2)

## 2024-06-18 LAB — CBG MONITORING, ED: Glucose-Capillary: 154 mg/dL — ABNORMAL HIGH (ref 70–99)

## 2024-06-18 LAB — D-DIMER, QUANTITATIVE: D-Dimer, Quant: <0.27 ug{FEU}/mL (ref 0.00–0.50)

## 2024-06-18 MED ORDER — POTASSIUM CHLORIDE CRYS ER 20 MEQ PO TBCR
40.0000 meq | EXTENDED_RELEASE_TABLET | Freq: Once | ORAL | Status: AC
  Administered 2024-06-18: 40 meq via ORAL
  Filled 2024-06-18: qty 2

## 2024-06-18 MED ORDER — SODIUM CHLORIDE 0.9 % IV BOLUS
1000.0000 mL | Freq: Once | INTRAVENOUS | Status: AC
  Administered 2024-06-18: 1000 mL via INTRAVENOUS

## 2024-06-18 NOTE — ED Triage Notes (Signed)
 Pt bib [facility] coming from [facility]. Pt was doing some grocery shopping when she began feeling lightheaded/dizzy at the register. Pt states I went blank for a few seconds and when I looked up everything was blurry. EMS states staff on scene said they did not witness pt pass out at all. Pt reports right sided chest pain. GCS 15.  EMS VS: 184/70 86HR 99% RA 161 cbg 324 Asprin  1 nitroglycerin 

## 2024-06-18 NOTE — Discharge Instructions (Addendum)
 It was a pleasure caring for you today in the emergency department.  Have someone stay with you until you feel stable. Do not drive, operate machinery, or play sports until symptoms resolved. Keep all follow-up appointments. Lie down right away if you start feeling like you might faint. Breathe deeply and steadily. Wait until all the symptoms have passed.Drink enough fluids to keep your urine clear or pale yellow. If you are taking blood pressure or heart medicine, get up slowly, taking several minutes to sit and then stand. This can reduce dizziness. SEEK IMMEDIATE MEDICAL CARE IF: You have a severe headache. You have unusual pain in the chest, abdomen, or back. You are bleeding from the mouth or rectum, or you have a black or tarry stool. You have an irregular or very fast heartbeat. You have pain with breathing. You have repeated fainting or seizure-like jerking during an episode. You faint when sitting or lying down. You have confusion. You have difficulty walking. You have severe weakness. You have vision problems. If you fainted, call your local emergency services - do not drive yourself to the hospital.   Please return to the emergency department immediately for any new or concerning symptoms, or if you get worse.

## 2024-06-18 NOTE — ED Provider Notes (Addendum)
 " Provider Note MRN:  [id number]  Arrival date & time: 06/18/24    ED Course and Medical Decision Making  Assumed care from Dr Mannie at shift change.  See note from prior team for complete details, in brief:   Clinical Course as of 06/18/24 2040  Fri Jun 18, 2024  1508 Handoff JS 58 yo/f Here w/ syncopal episode Feeling unwell today Prodrome, felt syncopal symptoms, no LOC per bystander Dimer neg EKG unchanged Delta trop Anticipate dc if w/u neg [SG]  1844 Trop neg x2 No ongoing CP Near syncope, does not seem cardiac Will give her cardiology f/u in the office given age She is feeling better  [SG]  1845 Potassium(!): 3.1 replace [SG]  1845 Creatinine(!): 1.72 Similar to prior  [SG]  1845 D-Dimer, Quant: <0.27 PE unlikely  [SG]    Clinical Course User Index [SG] Elnor Jayson LABOR, DO    She is feeling better, she is ambulatory. Plan discharge with close out patient follow-up  8:40 PM:  I have discussed the diagnosis/risks/treatment options with the patient.  Evaluation and diagnostic testing in the emergency department does not suggest an emergent condition requiring admission or immediate intervention beyond what has been performed at this time.  They will follow up with pcp/cardiology. We also discussed returning to the ED immediately if new or worsening sx occur. We discussed the sx which are most concerning (e.g., sudden worsening pain, fever, inability to tolerate by mouth) that necessitate immediate return.    The patient appears reasonably screened and/or stabilized for discharge and I doubt any other medical condition or other [facility] requiring further screening, evaluation, or treatment in the ED at this time prior to discharge.    Procedures  Final Clinical Impressions(s) / ED Diagnoses     ICD-10-CM   1. Near syncope  R55 Ambulatory referral to Cardiology      ED Discharge Orders          Ordered    Ambulatory referral to Cardiology        06/18/24 2039               Discharge Instructions      It was a pleasure caring for you today in the emergency department.  Have someone stay with you until you feel stable. Do not drive, operate machinery, or play sports until symptoms resolved. Keep all follow-up appointments. Lie down right away if you start feeling like you might faint. Breathe deeply and steadily. Wait until all the symptoms have passed.Drink enough fluids to keep your urine clear or pale yellow. If you are taking blood pressure or heart medicine, get up slowly, taking several minutes to sit and then stand. This can reduce dizziness. SEEK IMMEDIATE MEDICAL CARE IF: You have a severe headache. You have unusual pain in the chest, abdomen, or back. You are bleeding from the mouth or rectum, or you have a black or tarry stool. You have an irregular or very fast heartbeat. You have pain with breathing. You have repeated fainting or seizure-like jerking during an episode. You faint when sitting or lying down. You have confusion. You have difficulty walking. You have severe weakness. You have vision problems. If you fainted, call your local emergency services - do not drive yourself to the hospital.   Please return to the emergency department immediately for any new or concerning symptoms, or if you get worse.            Elnor Jayson LABOR, DO 06/18/24  2038    Elnor Jayson LABOR, DO 06/18/24 2040  "

## 2024-06-18 NOTE — ED Provider Notes (Signed)
 " [place] [facility] Provider Note  CSN: [id number] Arrival date & time: 06/18/24 1347  Chief Complaint(s) Near Syncope and Chest Pain  HPI Jenna Stokes is a 59 y.o. female who is here today after syncopal episode.  Patient states she was at the grocery store, was doing some shopping when she began to feel little bit lightheaded while she was at the register.  She states that she had felt that way this morning, shortly after taking her Imdur and her combo BP pill.  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  She states that she did go down to the ground but did not strike her head.  She states that she has been having some chest pain on the right side of her chest wall for the last few days that has been intermittent.   Past Medical History Past Medical History:  Diagnosis Date   AKI (acute kidney injury) 12/16/2020   DM type 2, uncontrolled, with renal complications 09/20/2013   Gout, arthritis 03/09/2024   Hyperlipidemia    Hypertension    Morbid obesity (HCC)    Patient Active Problem List   Diagnosis Date Noted   Gout, arthritis 03/09/2024   Multinodular goiter 03/09/2024   Osteoarthritis of both knees 03/09/2024   Health care maintenance 03/09/2024   Chronic idiopathic constipation 02/01/2024   Mild intermittent asthma 02/01/2024   Vitamin D deficiency 02/01/2024   Acute midline low back pain without sciatica 08/19/2023   Acute pain of both shoulders 08/19/2023   Resistant hypertension 07/21/2023   Asthma 07/21/2023   Hypertensive crisis 07/11/2023   Chronic obstructive pulmonary disease (HCC) 07/09/2023   SOBOE (shortness of breath on exertion) 07/09/2023   CKD stage 3b, GFR 30-44 ml/min (HCC) 07/09/2023   Hypertensive urgency 07/08/2023   Pedal edema 09/30/2020   Lung nodule 01/08/2018   OSA (obstructive sleep apnea) 03/20/2017   Hyperlipidemia associated with type 2 diabetes mellitus (HCC) 02/18/2017   Diabetes mellitus (HCC) 04/12/2016    Mixed hyperlipidemia 08/19/2013   Hypertension associated with diabetes (HCC) 08/19/2013   Morbid obesity (HCC)    Home Medication(s) Prior to Admission medications  Medication Sig Start Date End Date Taking? Authorizing Provider  AIRSUPRA 90-80 MCG/ACT AERO Inhale 2 puffs into the lungs 4 (four) times daily. Patient taking differently: Inhale 2 puffs into the lungs as needed. 04/11/23   [provider]  allopurinol  (ZYLOPRIM ) 100 MG tablet Take 0.5 tablets (50 mg total) by mouth daily. 03/10/24   Paseda, Folashade R, FNP  atorvastatin  (LIPITOR) 40 MG tablet Take 1 tablet (40 mg total) by mouth daily. 03/10/24   Paseda, Folashade R, FNP  dapagliflozin  propanediol (FARXIGA ) 10 MG TABS tablet Take 1 tablet (10 mg total) by mouth daily before breakfast. 12/25/23   Walker, Caitlin S, NP  ergocalciferol (VITAMIN D2) 1.25 MG (50000 UT) capsule 1 capsule Orally    [provider]  isosorbide -hydrALAZINE  (BIDIL ) 20-37.5 MG tablet TAKE 2 TABLETS IN THE MORNING AND 3 TABLETS IN THE EVENING Patient taking differently: Take 2 tablets by mouth in the morning and at bedtime. TAKE 2 TABLETS IN THE MORNING AND 3 TABLETS IN THE EVENING 10/23/23   Raford Riggs, MD  linaclotide  (LINZESS ) 72 MCG capsule Take 1 capsule (72 mcg total) by mouth daily before breakfast. Additional refills from PCP or gastroenterologist 04/21/24   Raford Riggs, MD  Olmesartan -amLODIPine -HCTZ 40-10-25 MG TABS TAKE 1 TABLET BY MOUTH DAILY 01/29/24   Walker, Caitlin S, NP  tirzepatide  (MOUNJARO ) 7.5  MG/0.5ML Pen Inject 7.5 mg into the skin once a week. 04/09/24   Paseda, Folashade R, FNP  VITAMIN D PO Take 1 tablet by mouth daily. *pt doesn't know units*    [provider]                                                                                                                                    Past Surgical History Past Surgical History:  Procedure Laterality Date   ABDOMINAL HYSTERECTOMY      CESAREAN SECTION     INCISION AND DRAINAGE ABSCESS Left 12/20/2020   Procedure: INCISION AND DRAINAGE ABSCESS;  Surgeon: Curvin Deward MOULD, MD;  Location: [facility];  Service: General;  Laterality: Left;   TONSILLECTOMY     Family History Family History  Problem Relation Age of Onset   Heart disease Mother    Diabetes Mother    Hypertension Sister    Diabetes Sister    Adrenal disorder Sister     Social History Social History[1] Allergies Penicillins, Dapagliflozin  pro-metformin  er, and Metformin  hcl  Review of Systems Review of Systems  Physical Exam Vital Signs  I have reviewed the triage vital signs BP (!) 147/58 (BP Location: Right Arm)   Pulse 71   Temp 98.2 F (36.8 C)   Resp 15   Ht 5' 10 (1.778 m)   Wt (!) 153.3 kg   LMP 08/10/2006   SpO2 100%   BMI 48.50 kg/m   Physical Exam Vitals and nursing note reviewed.  Constitutional:      Appearance: She is not toxic-appearing.  HENT:     Head: Normocephalic and atraumatic.  Eyes:     Pupils: Pupils are equal, round, and reactive to light.  Cardiovascular:     Rate and Rhythm: Normal rate.     Heart sounds: Normal heart sounds.  Pulmonary:     Effort: Pulmonary effort is normal.     Breath sounds: Decreased breath sounds present.  Chest:     Chest wall: No mass, tenderness or edema.  Abdominal:     Palpations: Abdomen is soft.  Musculoskeletal:     Cervical back: Normal range of motion.  Skin:    General: Skin is warm.  Neurological:     General: No focal deficit present.     Mental Status: She is alert.     Cranial Nerves: No cranial nerve deficit.     Motor: No weakness.     Comments: Normal gait     ED Results and Treatments Labs (all labs ordered are listed, but only abnormal results are displayed) Labs Reviewed  CBG MONITORING, ED - Abnormal; Notable for the following components:      Result Value   Glucose-Capillary 154 (*)    All other components within normal limits  CBC  D-DIMER,  QUANTITATIVE  COMPREHENSIVE METABOLIC PANEL WITH GFR  Radiology No results found.  Pertinent labs & imaging results that were available during my care of the patient were reviewed by me and considered in my medical decision making (see MDM for details).  Medications Ordered in ED Medications  sodium chloride  0.9 % bolus 1,000 mL (has no administration in time range)                                                                                                                                     Procedures Procedures  (including critical care time)  Medical Decision Making / ED Course   This patient presents to the ED for concern of syncope, this involves an extensive number of treatment options, and is a complaint that carries with it a high risk of complications and morbidity.  The differential diagnosis includes vasovagal episode, cardiogenic syncope, anemia, dehydration, hypotension, less likely seizure, less likely CVA, consider PE.  MDM: 59 year old female here today after syncopal episode.  On exam, patient overall looks quite well.  She has no neurological deficits, normal heart sounds, normal vital signs.  I do think there could be a component of the patient having transient BP drop secondary to taking her Imdur, olmesartan /HCTZ this morning.  She also says that she felt a little bit worse with the nitro that EMS had provided.  No head trauma.  Patient pending EKG.  She has been having some intermittent pain on the right side of her chest over the last few days.  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  Patient be signed out to Dr. Elnor pending remainder of workup.   Additional history obtained: -Additional history obtained from history at bedside, EMS -External records from outside source obtained  and reviewed including: Chart review including previous notes, labs, imaging, consultation notes   Lab Tests: -I ordered, reviewed, and interpreted labs.   The pertinent results include:   Labs Reviewed  CBG MONITORING, ED - Abnormal; Notable for the following components:      Result Value   Glucose-Capillary 154 (*)    All other components within normal limits  CBC  D-DIMER, QUANTITATIVE  COMPREHENSIVE METABOLIC PANEL WITH GFR    Social Determinants of Health:  Factors impacting patients care include: Lack of access to primary care  Co morbidities that complicate the patient evaluation  Past Medical History:  Diagnosis Date   AKI (acute kidney injury) 12/16/2020   DM type 2, uncontrolled, with renal complications 09/20/2013   Gout, arthritis 03/09/2024   Hyperlipidemia    Hypertension    Morbid obesity (HCC)           Final Clinical Impression(s) / ED Diagnoses Final diagnoses:  Syncope and collapse     @PCDICTATION @     [1]  Social History Tobacco Use   Smoking status: Never    Passive exposure: Never   Smokeless  tobacco: Never  Vaping Use   Vaping status: Never Used  Substance Use Topics   Alcohol use: No   Drug use: No     Mannie Pac T, DO 06/18/24 1452  "

## 2024-06-18 NOTE — ED Notes (Signed)
 Pt given soda and crackers for PO challenge, pt tolerated well. Pt ambulated in department, no concerns noted.

## 2024-06-22 ENCOUNTER — Telehealth: Payer: Self-pay

## 2024-06-22 ENCOUNTER — Telehealth: Payer: Self-pay | Admitting: Family

## 2024-06-22 NOTE — Transitions of Care (Post Inpatient/ED Visit) (Signed)
" ° °  06/22/2024  Name: ELYSIA GRAND MRN: [id number] DOB: 1965-08-03  Today's TOC FU Call Status: Today's TOC FU Call Status:: Unsuccessful Call (1st Attempt) Unsuccessful Call (1st Attempt) Date: 06/22/24  Attempted to reach the patient regarding the most recent Inpatient/ED visit.  Follow Up Plan: Additional outreach attempts will be made to reach the patient to complete the Transitions of Care (Post Inpatient/ED visit) call.   Signature Geroge Hahn, CMA   "

## 2024-06-22 NOTE — Telephone Encounter (Signed)
" °  Patient is requesting to speak with Gabe Finder. She said about her recent ED visit "

## 2024-06-22 NOTE — Telephone Encounter (Signed)
 Patient was calling into the office to update Reche Finder, NP about recent ER visit on 1/6.  Pt went into the ER on 1/16 for near syncope and chest discomfort event, while at the grocery store.  She states she felt lightheaded while grocery shopping.  She states she had been having this issue every morning, shortly after taking her Bidil  and Olmesartan -amlodipine -hydrochlorothiazide  combo pill.  She also mentioned having right sided chest discomfort at the store, intermittent in nature.   Pt did not have hypotensive numbers when EMS picked her up, or during her entire ER stay.  Pt states her numbers were in fact hypertensive.  Pt states when EMS picked her up, her BP was 184/70 and in the ER was 164/99.  She said for the most part, her BP remained pretty elevated during the ER visit.   Pt did have a full cardiac work-up in the ER with negative troponins and a negative d-dimer.  She had other labs which revealed low K level of 3.1, and was given a one time dose of KDUR in the ER.    Pt was discharged from the ER same day, with no new medication changes.  She was advised to contact our office and PCP for close follow-up.  Pt is calling into the office to make Dr. Raford and Reche Finder, NP aware of her ED visit and to obtain a post-ER follow-up appt.    Asked pt how her ambulatory BP readings are since discharge, and she states her systolic readings on average have been running in the 150s-160s range.  She states she can't get them under 150.  She confirmed taking all her cardiac/BP medications.  She states she is trying to get more potassium in her diet, given recent low K level of 3.1 in the ER.  Pt did mention she recently saw Nephrology, and he advised that she needed to contact our office to consider coming off of olmesartan -amlodipine -hydrochlorothiazide , and switched to another alternative BP medication.  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  She attributes this to her hypertensive numbers.  Pt states the ER MD wrote her out of work until 1/21.   Pt is established in HTN clinic with Dr. Raford and Reche Finder, NP.  Noted an opening on Caitlin's HTN schedule for this Thursday 1/22 at 0850.  Scheduled the pt to be seen at that time.  Advised the pt to continue her current regimen until then and continue monitoring ambulatory BP's.  Advised her to bring those readings to her appt on 1/22.  Advised her to continue getting a healthy amount of  potassium in her diet. Pt education provided about this.  Pt is aware I will share this plan with Reche Finder, NP.   Pt verbalized understanding and agrees with this plan.

## 2024-06-23 ENCOUNTER — Telehealth: Payer: Self-pay

## 2024-06-23 NOTE — Transitions of Care (Post Inpatient/ED Visit) (Signed)
" ° °  06/23/2024  Name: ASHLIN KREPS MRN: [id number] DOB: June 27, 1965  Today's TOC FU Call Status: Today's TOC FU Call Status:: Unsuccessful Call (2nd Attempt) Unsuccessful Call (2nd Attempt) Date: 06/23/24  Attempted to reach the patient regarding the most recent Inpatient/ED visit.  Follow Up Plan: Additional outreach attempts will be made to reach the patient to complete the Transitions of Care (Post Inpatient/ED visit) call.   Signature Suzen Shove   CMA II  "

## 2024-06-24 ENCOUNTER — Ambulatory Visit (INDEPENDENT_AMBULATORY_CARE_PROVIDER_SITE_OTHER): Admitting: Family

## 2024-06-24 ENCOUNTER — Encounter (HOSPITAL_BASED_OUTPATIENT_CLINIC_OR_DEPARTMENT_OTHER): Payer: Self-pay | Admitting: *Deleted

## 2024-06-24 ENCOUNTER — Telehealth: Payer: Self-pay

## 2024-06-24 ENCOUNTER — Encounter (HOSPITAL_BASED_OUTPATIENT_CLINIC_OR_DEPARTMENT_OTHER): Payer: Self-pay | Admitting: Family

## 2024-06-24 VITALS — BP 180/90 | HR 82 | Ht 70.0 in | Wt 338.0 lb

## 2024-06-24 DIAGNOSIS — E876 Hypokalemia: Secondary | ICD-10-CM | POA: Diagnosis not present

## 2024-06-24 DIAGNOSIS — I5032 Chronic diastolic (congestive) heart failure: Secondary | ICD-10-CM | POA: Diagnosis not present

## 2024-06-24 DIAGNOSIS — G4733 Obstructive sleep apnea (adult) (pediatric): Secondary | ICD-10-CM | POA: Diagnosis not present

## 2024-06-24 DIAGNOSIS — I10 Essential (primary) hypertension: Secondary | ICD-10-CM

## 2024-06-24 DIAGNOSIS — E1165 Type 2 diabetes mellitus with hyperglycemia: Secondary | ICD-10-CM

## 2024-06-24 DIAGNOSIS — N1832 Chronic kidney disease, stage 3b: Secondary | ICD-10-CM | POA: Diagnosis not present

## 2024-06-24 MED ORDER — CLONIDINE HCL 0.1 MG PO TABS: 0.1000 mg | ORAL_TABLET | Freq: Two times a day (BID) | ORAL | 1 refills | Status: AC

## 2024-06-24 MED ORDER — CLONIDINE HCL 0.1 MG PO TABS: 0.1000 mg | ORAL_TABLET | Freq: Every day | ORAL | 1 refills | Status: DC

## 2024-06-24 NOTE — Transitions of Care (Post Inpatient/ED Visit) (Cosign Needed)
" ° °  06/24/2024  Name: Jenna Stokes MRN: [id number] DOB: 1965-11-22  Today's TOC FU Call Status: Today's TOC FU Call Status:: Unsuccessful Call (3rd Attempt) Unsuccessful Call (3rd Attempt) Date: 06/24/24  Attempted to reach the patient regarding the most recent Inpatient/ED visit.  Follow Up Plan: No further outreach attempts will be made at this time. We have been unable to contact the patient.  Signature Suzen Shove   CMA II  "

## 2024-06-24 NOTE — Patient Instructions (Addendum)
 Medication Instructions:   Take Clonidine  0.1 mg by mouth twice daily   Labwork:  TODAY--3RD FLOOR [address] AT [facility]--BMET, Catecholamines, fractionated, plasma  , Metanephrines, plasma , AND  Thyroid  Panel With TSH   Testing/Procedures:  Your physician has requested that you have a renal artery duplex. During this test, an ultrasound is used to evaluate blood flow to the kidneys. Allow one hour for this exam. Do not eat after midnight the day before and avoid carbonated beverages. Take your medications as you usually do.    Follow-Up: Please follow up in _1_ months in [facility] with Dr. Raford, Reche Finder, NP or Kristin Alvstad PharmD

## 2024-06-24 NOTE — Progress Notes (Signed)
 "  [facility] Assessment:    Date:  06/24/2024   ID:  Jenna Stokes, DOB Apr 15, 1966, MRN [id number]  PCP:  Paseda, Folashade R, FNP  Cardiologist:  Aleene Passe, MD (Inactive)  Nephrologist:  Referring MD: Paseda, Folashade R, FNP   CC: Hypertension  History of Present Illness:    Jenna Stokes is a 59 y.o. female with a hx of  history of morbid obesity, hypertension, DM2, CKD3b.,  Multinodular goiter, COPD. Here to follow up  with [facility].   Admitted 2/4-07/13/23 after presenting with shortness of breath.  Treated for hypertensive urgency.  CT scan chest unremarkable and normal WBC.  She was given Pulmicort  nebulizer for COPD while inpatient.  Echo LVEF 70 to 75%, moderate LVH, RV normal, mild MR.  Due to pedal edema bilateral Doppler was negative for DVT.  Discharged on irbesartan , amlodipine , BiDil , Lasix .  Lasix  added due to exertional dyspnea, lower extremity edema.  Established with [facility] 07/17/23. Jenna Stokes was diagnosed with hypertension in her 30s or 58s. No tobacco use.  Irbesartan , amlodipine , Lasix  were stopped.  She was started on olmesartan -amlodipine -hydrochlorothiazide  40 - 10 - 25 mg daily.  BiDil  20-37.52 tabs twice daily was continued.  Lab work for hyperaldosteronism unremarkable.  Sleep study ordered.  Previous diagnosis of multinodular goiter previously seen  by Dr. Tommas.  Ultrasound 04/22/2022 with borderline thyroid  megaly suggestive of multinodular goiter with nodule #2 meeting imaging criteria for 1 year follow-up (nodule #2 left, inferior, 1.2 cm). 07/2022 parathyroid  planer with no parathyroid  adenoma. Previously encouraged to schedule follow up.   At visit 10/23/23 Bidil  increased to two tablets TID. Farxiga  samples provided for LE edema and HFpEF benefit.   Last seen 04/21/24 her BP was well-controlled.  Current regimen continued.  She was also having stressors and panic attacks related to work.   She was recommended to follow-up with primary care regarding this.  She was also encouraged to trial alternative masks due to difficulties with CPAP.  2-month follow-up recommended.  ED visit 1/60/26 with near syncope.  K at 3.1 repleted.  Troponin X2 negative.  It was not felt to be cardiac in nature.  Presents today for follow up. Notes continued work stressors. Discussed this may be cause of her elevated blood pressure readings since her last clinic visit has been elevated at home. No changes in diet routine, she is eating once per day with Mounjaro  not adding much salt. No formal exercise routine. Her last blood pressure less than 130/80 on home monitoring was early December. Since that time has been elevated 140-180s at home recently. She takes her morning medication 10-10:30 am, has not yet taken today. One brief episode of lightheadedness since leaving ED which was not as bad. Reports some right sided chest discomfort intermittently that independently resolves and occurs at rest.   Previous antihypertensives: Spironolactone  Lisinopril  Losartan Carvedilol  chlorthalidone   Past Medical History:  Diagnosis Date   AKI (acute kidney injury) 12/16/2020   DM type 2, uncontrolled, with renal complications 09/20/2013   Gout, arthritis 03/09/2024   Hyperlipidemia    Hypertension    Morbid obesity (HCC)     Past Surgical History:  Procedure Laterality Date   ABDOMINAL HYSTERECTOMY     CESAREAN SECTION     INCISION AND DRAINAGE ABSCESS Left 12/20/2020   Procedure: INCISION AND DRAINAGE ABSCESS;  Surgeon: Curvin Deward MOULD, MD;  Location: [facility];  Service: General;  Laterality: Left;   TONSILLECTOMY  Current Medications: Current Meds  Medication Sig   AIRSUPRA 90-80 MCG/ACT AERO Inhale 2 puffs into the lungs 4 (four) times daily. (Patient taking differently: Inhale 2 puffs into the lungs as needed.)   allopurinol  (ZYLOPRIM ) 100 MG tablet Take 0.5 tablets (50 mg total) by mouth daily.    atorvastatin  (LIPITOR) 40 MG tablet Take 1 tablet (40 mg total) by mouth daily.   dapagliflozin  propanediol (FARXIGA ) 10 MG TABS tablet Take 1 tablet (10 mg total) by mouth daily before breakfast.   ergocalciferol (VITAMIN D2) 1.25 MG (50000 UT) capsule 1 capsule Orally   isosorbide -hydrALAZINE  (BIDIL ) 20-37.5 MG tablet TAKE 2 TABLETS IN THE MORNING AND 3 TABLETS IN THE EVENING   linaclotide  (LINZESS ) 72 MCG capsule Take 1 capsule (72 mcg total) by mouth daily before breakfast. Additional refills from PCP or gastroenterologist   MOUNJARO  10 MG/0.5ML Pen Inject 10 mg into the skin once a week.   Olmesartan -amLODIPine -HCTZ 40-10-25 MG TABS TAKE 1 TABLET BY MOUTH DAILY   VITAMIN D PO Take 1 tablet by mouth daily. *pt doesn't know units*     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      Home Situation:       Spiritual Beliefs:       Lifestyle: no regular exercise; diet is so so            Social Drivers of Health   Tobacco Use: Low Risk (06/24/2024)   Patient History    Smoking Tobacco Use: Never    Smokeless Tobacco Use: Never    Passive Exposure: Never  Financial Resource Strain: Not on file  Food Insecurity: No Food Insecurity (07/09/2023)   Hunger Vital Sign    Worried About Running Out of Food in the Last Year: Never true    Ran Out of Food in the Last Year: Never true  Transportation  Needs: No Transportation Needs (07/09/2023)   PRAPARE - Administrator, Civil Service (Medical): No    Lack of Transportation (Non-Medical): No  Physical Activity: Not on file  Stress: Not on file  Social Connections: Not on file  Depression (PHQ2-9): Low Risk (03/09/2024)   Depression (PHQ2-9)    PHQ-2 Score: 0  Alcohol Screen: Not on file  Housing: Low Risk (07/09/2023)   Housing Stability Vital Sign    Unable to Pay for Housing in the Last Year: No    Number of Times Moved in the Last Year: 0    Homeless in the Last Year: No  Utilities: Not At Risk (07/09/2023)   AHC Utilities    Threatened with loss of utilities: No  Health Literacy: Not on file     Family History: The patient's family history includes Adrenal disorder in her sister; Diabetes in her mother  and sister; Heart disease in her mother; Hypertension in her sister.  ROS:   Please see the history of present illness.     All other systems reviewed and are negative.  EKGs/Labs/Other Studies Reviewed:         Recent Labs: 07/08/2023: B Natriuretic Peptide 60.9 07/09/2023: Magnesium  2.3; TSH 0.527 06/18/2024: ALT 27; BUN 32; Creatinine, Ser 1.72; Hemoglobin 12.3; Platelets 214; Potassium 3.1; Sodium 140   Recent Lipid Panel    Component Value Date/Time   CHOL 227 (H) 03/09/2024 1210   TRIG 323 (H) 03/09/2024 1210   HDL 45 03/09/2024 1210   CHOLHDL 5.0 (H) 03/09/2024 1210   CHOLHDL 5.0 07/09/2023 1402   VLDL 35 07/09/2023 1402   LDLCALC 125 (H) 03/09/2024 1210   LDLDIRECT 107.0 08/31/2021 1036    Physical Exam:   VS:  BP (!) 220/94 (BP Location: Left Arm, Patient Position: Sitting, Cuff Size: Large)   Pulse 82   Ht 5' 10 (1.778 m)   Wt (!) 338 lb (153.3 kg)   LMP 08/10/2006   SpO2 95%   BMI 48.50 kg/m  , BMI Body mass index is 48.5 kg/m. GENERAL:  Well appearing HEENT: Pupils equal round and reactive, fundi not visualized, oral mucosa unremarkable NECK:  No jugular venous distention, waveform  within normal limits, carotid upstroke brisk and symmetric, no bruits, no thyromegaly LYMPHATICS:  No cervical adenopathy LUNGS:  Clear to auscultation bilaterally HEART:  RRR.  PMI not displaced or sustained,S1 and S2 within normal limits, no S3, no S4, no clicks, no rubs, no murmurs ABD:  Flat, positive bowel sounds normal in frequency in pitch, no bruits, no rebound, no guarding, no midline pulsatile mass, no hepatomegaly, no splenomegaly EXT:  2 plus pulses throughout, no edema, no cyanosis no clubbing SKIN:  No rashes no nodules NEURO:  Cranial nerves II through XII grossly intact, motor grossly intact throughout PSYCH:  Cognitively intact, oriented to person place and time   ASSESSMENT/PLAN:    HTN / HFpEF - BP not at goal <130/80.  Recent ED visit with near syncope. Prior echo 07/2023 LVEF 70 to 75%, no RWMA, moderate LVH, mild MR. She was not hypotensive. Low suspicion for cardiac origin, no further workup recommended Continue Olmesartan -Amlodipine -hydrochlorothiazide  40-10-25mg  daily, Bidil  20-37.5mg  two tablets AM and 3 tablets PM.  Rx Clonidine  0.1mg  BID. Discussed with Dr. Raford. Given prior intolerances, limited choices on agents.  Initiate secondary HTN workup and consider RDN if unremarkable Prior Renin-aldosterone with no hyperaldosteronism.  Plan for renal artery duplex Labs today catecholamines, metanephrines, thyroid  panel, BMP ED provider cleared her to return to work 06/23/24. She has not yet returned to work. As starting new medication, may return to work 06/28/24.  OSA -mild by sleep study.  CPAP adherence encouraged.  DM2 / Obesity - 07/09/23 A1c 7.0. Continue Mounjaro .  Managed by PCP.  Multinodular goiter - Normal TSH 07/09/23. Prior thyroid  US  04/2022 recommended for repeat in 1 year. Has not required thyroid  medication. Previously followed with Dr. Balan. Previously discussed of repeat thyroid  US  for monitoring with PCP or Dr. Tommas.  CKDIIIb - Careful titration  of diuretic and antihypertensive.  Continue Farxiga  10mg  daily.   Hypokalemia - K 3.1 at recent ED visit which was replaced. Update  BMP to reassess K.  Screening for Secondary Hypertension:     07/17/2023   12:48 PM  Causes  Renovascular HTN Screened  Sleep Apnea Screened     - Comments 07/2023 itamar ordered  Thyroid  Disease  Screened     - Comments 07/09/23 normal TSH, known multinodular goiter  Hyperaldosteronism Screened     - Comments 07/17/23 renin-aldo collected  Pheochromocytoma N/A     - Comments normal adrenal by CT  Compliance Screened     - Comments noted prior nonadherence, notes taking regularly now    Relevant Labs/Studies:    Latest Ref Rng & Units 06/18/2024    2:15 PM 03/09/2024   12:10 PM 12/25/2023    2:20 PM  Basic Labs  Sodium 135 - 145 mmol/L 140  137  143   Potassium 3.5 - 5.1 mmol/L 3.1  3.9  3.8   Creatinine 0.44 - 1.00 mg/dL 8.27  8.22  8.12        Latest Ref Rng & Units 07/09/2023    2:02 PM 06/09/2019   12:42 PM  Thyroid    TSH 0.350 - 4.500 uIU/mL 0.527  1.24        Latest Ref Rng & Units 07/17/2023   10:01 AM 01/12/2018    3:27 AM 01/09/2018   12:56 PM  Renin/Aldosterone   Aldosterone 0.0 - 30.0 ng/dL 89.8  4.6  77.8   Aldos/Renin Ratio 0.0 - 30.0 9.5                02/10/2018    9:50 AM  Renovascular   Renal Artery US  Completed Yes       Disposition:     follow up in 1 mos  Medication Adjustments/Labs and Tests Ordered: Current medicines are reviewed at length with the patient today.  Concerns regarding medicines are outlined above.  No orders of the defined types were placed in this encounter.  No orders of the defined types were placed in this encounter.    Signed, Reche GORMAN Finder, NP  06/24/2024 9:21 AM    [facility] "

## 2024-06-28 ENCOUNTER — Other Ambulatory Visit

## 2024-06-30 ENCOUNTER — Inpatient Hospital Stay: Payer: Self-pay | Admitting: Nurse Practitioner

## 2024-06-30 ENCOUNTER — Ambulatory Visit: Payer: Self-pay

## 2024-06-30 LAB — BASIC METABOLIC PANEL WITH GFR
BUN/Creatinine Ratio: 13 (ref 9–23)
BUN: 19 mg/dL (ref 6–24)
CO2: 22 mmol/L (ref 20–29)
Calcium: 10.2 mg/dL (ref 8.7–10.2)
Chloride: 107 mmol/L — ABNORMAL HIGH (ref 96–106)
Creatinine, Ser: 1.45 mg/dL — ABNORMAL HIGH (ref 0.57–1.00)
Glucose: 103 mg/dL — ABNORMAL HIGH (ref 70–99)
Potassium: 4.2 mmol/L (ref 3.5–5.2)
Sodium: 145 mmol/L — ABNORMAL HIGH (ref 134–144)
eGFR: 42 mL/min/{1.73_m2} — ABNORMAL LOW

## 2024-06-30 LAB — THYROID PANEL WITH TSH
Free Thyroxine Index: 2.3 (ref 1.2–4.9)
T3 Uptake Ratio: 28 % (ref 24–39)
T4, Total: 8.2 ug/dL (ref 4.5–12.0)
TSH: 1.95 u[IU]/mL (ref 0.450–4.500)

## 2024-06-30 LAB — CATECHOLAMINES, FRACTIONATED, PLASMA
Dopamine: 25.7 pg/mL (ref 0.0–36.7)
Epinephrine: 13.1 pg/mL (ref 0.0–55.4)
Norepinephrine: 341 pg/mL (ref 115–524)

## 2024-06-30 LAB — METANEPHRINES, PLASMA
Metanephrine, Free: <25 pg/mL (ref 0.0–88.0)
Normetanephrine, Free: 77 pg/mL (ref 0.0–244.0)

## 2024-06-30 NOTE — Telephone Encounter (Signed)
 FYI Only or Action Required?: FYI only for provider: appointment scheduled on 07/02/24.  Patient was last seen in primary care on 03/09/2024 by Paseda, Folashade R, FNP.  Called Nurse Triage reporting Hypertension.  Symptoms began several weeks ago.  Interventions attempted: Prescription medications: Clonidine .  Symptoms are: unchanged.  Triage Disposition: See Physician Within 24 Hours  Patient/caregiver understands and will follow disposition?: Yes  Reason for Disposition  Systolic BP >= 180 OR Diastolic >= 110  Answer Assessment - Initial Assessment Questions Patient was recently hospitalized for high blood pressure and was seen by cardiologist last week as well. She reports that her BP is still running high and she is supposed to have an appt with PCP for hospital follow up today, but is unable to safely leave her home due to hazardous weather. Spoke with CAL at [facility] office and rescheduled patient's appt to Friday 07/02/24 at 10:20AM. Advised to call back if she starts to experience any symptoms.   1. BLOOD PRESSURE: What is your blood pressure? Did you take at least two measurements 5 minutes apart?     187/82 this morning, states it has been around this number all week  2. ONSET: When did you take your blood pressure?     This morning  3. HOW: How did you take your blood pressure? (e.g., automatic home BP monitor, visiting nurse)     Home monitor  4. HISTORY: Do you have a history of high blood pressure?     Yes  5. MEDICINES: Are you taking any medicines for blood pressure? Have you missed any doses recently?     Clonidine  0.1mg   6. OTHER SYMPTOMS: Do you have any symptoms? (e.g., blurred vision, chest pain, difficulty breathing, headache, weakness)     Denies any current symptoms, but has had intermittent chest pain, tingling in fingers, and dizziness  7. PREGNANCY: Is there any chance you are pregnant? When was your last menstrual  period?     NA  Protocols used: Blood Pressure - High-A-AH  Reason for Triage: Patient's blood pressure is still elevated over 180  Symptoms: weakness, difficulty focusing, some chest pains. Would like to reschedule HFU appt for virtual appt

## 2024-07-01 ENCOUNTER — Ambulatory Visit (HOSPITAL_BASED_OUTPATIENT_CLINIC_OR_DEPARTMENT_OTHER): Payer: Self-pay | Admitting: Family

## 2024-07-02 ENCOUNTER — Encounter: Payer: Self-pay | Admitting: Nurse Practitioner

## 2024-07-02 ENCOUNTER — Ambulatory Visit: Payer: Self-pay | Admitting: Nurse Practitioner

## 2024-07-02 VITALS — BP 145/73 | HR 79 | Wt 332.0 lb

## 2024-07-02 DIAGNOSIS — G4733 Obstructive sleep apnea (adult) (pediatric): Secondary | ICD-10-CM

## 2024-07-02 DIAGNOSIS — I1A0 Resistant hypertension: Secondary | ICD-10-CM | POA: Diagnosis not present

## 2024-07-02 DIAGNOSIS — N1832 Chronic kidney disease, stage 3b: Secondary | ICD-10-CM | POA: Diagnosis not present

## 2024-07-02 DIAGNOSIS — Z566 Other physical and mental strain related to work: Secondary | ICD-10-CM | POA: Diagnosis not present

## 2024-07-02 DIAGNOSIS — E042 Nontoxic multinodular goiter: Secondary | ICD-10-CM

## 2024-07-02 DIAGNOSIS — Z09 Encounter for follow-up examination after completed treatment for conditions other than malignant neoplasm: Secondary | ICD-10-CM | POA: Diagnosis not present

## 2024-07-02 DIAGNOSIS — E785 Hyperlipidemia, unspecified: Secondary | ICD-10-CM

## 2024-07-02 DIAGNOSIS — M109 Gout, unspecified: Secondary | ICD-10-CM

## 2024-07-02 DIAGNOSIS — E1169 Type 2 diabetes mellitus with other specified complication: Secondary | ICD-10-CM

## 2024-07-02 DIAGNOSIS — R55 Syncope and collapse: Secondary | ICD-10-CM | POA: Diagnosis not present

## 2024-07-02 DIAGNOSIS — E559 Vitamin D deficiency, unspecified: Secondary | ICD-10-CM | POA: Diagnosis not present

## 2024-07-02 DIAGNOSIS — E876 Hypokalemia: Secondary | ICD-10-CM | POA: Insufficient documentation

## 2024-07-02 NOTE — Assessment & Plan Note (Signed)
 Most likely contributing to elevated blood pressure She will take off some time from work Need for adequate rest discussed

## 2024-07-02 NOTE — Assessment & Plan Note (Addendum)
 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 CKD stage 3b with improved function (EGFR 42, creatinine 1.45). Initial nausea from tirzepatide  improving. - Continue tirzepatide  10 mg weekly.  Farxiga  10 mg daily - Encouraged dietary modifications to reduce sugar and carbs. - Encouraged regular exercise as tolerated. - Ordered lipid panel. - Check A1c around February 19th.  Hyperlipidemia Previous LDL 120. Goal LDL <70 to reduce cardiovascular risk. - Continue atorvastatin  40 mg daily - Ordered lipid panel.

## 2024-07-02 NOTE — Assessment & Plan Note (Signed)
 Multinodular goiter Previous ultrasound attempt unsuccessful due to movement. Plan to repeat at different facility. - Ordered thyroid  ultrasound at different imaging center. Thyroid  function labs recently obtained within normal

## 2024-07-02 NOTE — Assessment & Plan Note (Signed)
 Lab Results  Component Value Date   NA 145 (H) 06/24/2024   K 4.2 06/24/2024   CO2 22 06/24/2024   GLUCOSE 103 (H) 06/24/2024   BUN 19 06/24/2024   CREATININE 1.45 (H) 06/24/2024   CALCIUM  10.2 06/24/2024   GFR 35.28 (L) 08/31/2021   EGFR 42 (L) 06/24/2024   GFRNONAA 34 (L) 06/18/2024  Now resolved, was given potassium supplement at emergency room

## 2024-07-02 NOTE — Assessment & Plan Note (Signed)
" ° °    07/02/2024   11:06 AM 07/02/2024   10:56 AM 06/24/2024    9:35 AM 06/24/2024    9:11 AM 06/18/2024    7:45 PM 06/18/2024    5:30 PM 06/18/2024    1:58 PM  BP/Weight  Systolic BP 145 165 180 220 164 115 147  Diastolic BP 73 70 90 94 99 46 58  Wt. (Lbs)  332  338     BMI  47.64 kg/m2  48.5 kg/m2     Resistant hypertension She has not taken her medications today Recent near syncope with BP in 180s despite adherence. Stress and illness may elevate BP. Current readings 165/70 and 145/73 without meds. Cardiologist managing with potential adjustments. - Continue clonidine  0.1 mg bid, olmesartan -amlodipine -hctz 40-10-25 mg daily, isosorbide -hydralazine  20-37.5 mg bid. - Encouraged rest and stress management. - Advised to avoid NSAIDs. - Monitor BP and follow up with cardiologist. - Provided work return note after 3 weeks if BP controlled.  "

## 2024-07-02 NOTE — Assessment & Plan Note (Signed)
 Obstructive sleep apnea Mild OSA with ongoing CPAP use. Reports frequent awakenings. - Continue CPAP therapy.

## 2024-07-02 NOTE — Assessment & Plan Note (Signed)
 Avoid NSAIDs and other nephrotoxic agents Continue Farxiga  10 mg daily Maintain hydration Lab Results  Component Value Date   NA 145 (H) 06/24/2024   K 4.2 06/24/2024   CO2 22 06/24/2024   GLUCOSE 103 (H) 06/24/2024   BUN 19 06/24/2024   CREATININE 1.45 (H) 06/24/2024   CALCIUM  10.2 06/24/2024   GFR 35.28 (L) 08/31/2021   EGFR 42 (L) 06/24/2024   GFRNONAA 34 (L) 06/18/2024

## 2024-07-02 NOTE — Assessment & Plan Note (Signed)
 Wt Readings from Last 3 Encounters:  07/02/24 (!) 332 lb (150.6 kg)  06/24/24 (!) 338 lb (153.3 kg)  06/18/24 (!) 338 lb (153.3 kg)   Body mass index is 47.64 kg/m.  Morbid obesity Contributing to hypertension and diabetes. Current weight management includes dietary changes and limited exercise. - Encouraged dietary modifications focusing on low sugar and carbs. - Encouraged regular exercise as tolerated.  At least 150 minutes weekly Continue Mounjaro  10 mg daily

## 2024-07-02 NOTE — Assessment & Plan Note (Signed)
 Hospital chart reviewed, including discharge summary Medications reconciled and reviewed with the patient in detail

## 2024-07-02 NOTE — Assessment & Plan Note (Signed)
 Last vitamin D  No results found for: 25OHVITD2, 25OHVITD3, VD25OH  Vitamin D  deficiency Previous prescription for supplementation. Current levels to be checked. - Checked vitamin D  levels. - Will prescribe supplementation based on results.

## 2024-07-02 NOTE — Assessment & Plan Note (Signed)
 Gout Well-managed with allopurinol  50 mg daily. No recent attacks. - Continue allopurinol  50 mg daily. - Checked uric acid level. Lab Results  Component Value Date   LABURIC 10.1 (H) 03/09/2024

## 2024-07-02 NOTE — Patient Instructions (Addendum)
 1. CKD stage 3b, GFR 30-44 ml/min (HCC) (Primary)  2. Gout, arthritis - Uric Acid  3. Vitamin D  deficiency - VITAMIN D  25 Hydroxy (Vit-D Deficiency, Fractures)  4. Hyperlipidemia associated with type 2 diabetes mellitus (HCC) - Lipid panel  5. Multinodular goiter - US  THYROID    Around 3 times per week, check your blood pressure 2 times per day. once in the morning and once in the evening. The readings should be at least one minute apart. Write down these values and bring them to your next nurse visit/appointment.  When you check your BP, make sure you have been doing something calm/relaxing 5 minutes prior to checking. Both feet should be flat on the floor and you should be sitting. Use your left arm and make sure it is in a relaxed position (on a table), and that the cuff is at the approximate level/height of your heart.  Blood pressure goal is less than 130/80    It is important that you exercise regularly at least 30 minutes 5 times a week as tolerated  Think about what you will eat, plan ahead. Choose  clean, green, fresh or frozen over canned, processed or packaged foods which are more sugary, salty and fatty. 70 to 75% of food eaten should be vegetables and fruit. Three meals at set times with snacks allowed between meals, but they must be fruit or vegetables. Aim to eat over a 12 hour period , example 7 am to 7 pm, and STOP after  your last meal of the day. Drink water,generally about 64 ounces per day, no other drink is as healthy. Fruit juice is best enjoyed in a healthy way, by EATING the fruit.  Thanks for choosing [facility] we consider it a privelige to serve you.

## 2024-07-02 NOTE — Assessment & Plan Note (Addendum)
 Doing fine today No syncope episodes since her last visit to the emergency room ED doctor suspects that her near syncope could be due to transient BP drop secondary to taking Imdur.  She has followed up with cardiology

## 2024-07-03 LAB — LIPID PANEL
Chol/HDL Ratio: 4.9 ratio — ABNORMAL HIGH (ref 0.0–4.4)
Cholesterol, Total: 248 mg/dL — ABNORMAL HIGH (ref 100–199)
HDL: 51 mg/dL
LDL Chol Calc (NIH): 161 mg/dL — ABNORMAL HIGH (ref 0–99)
Triglycerides: 195 mg/dL — ABNORMAL HIGH (ref 0–149)
VLDL Cholesterol Cal: 36 mg/dL (ref 5–40)

## 2024-07-03 LAB — URIC ACID: Uric Acid: 9 mg/dL — ABNORMAL HIGH (ref 3.0–7.2)

## 2024-07-03 LAB — VITAMIN D 25 HYDROXY (VIT D DEFICIENCY, FRACTURES): Vit D, 25-Hydroxy: 9.7 ng/mL — ABNORMAL LOW (ref 30.0–100.0)

## 2024-07-05 ENCOUNTER — Other Ambulatory Visit: Payer: Self-pay | Admitting: Nurse Practitioner

## 2024-07-05 ENCOUNTER — Ambulatory Visit: Payer: Self-pay | Admitting: Nurse Practitioner

## 2024-07-05 DIAGNOSIS — E1169 Type 2 diabetes mellitus with other specified complication: Secondary | ICD-10-CM

## 2024-07-05 DIAGNOSIS — E559 Vitamin D deficiency, unspecified: Secondary | ICD-10-CM

## 2024-07-05 DIAGNOSIS — E782 Mixed hyperlipidemia: Secondary | ICD-10-CM

## 2024-07-05 MED ORDER — VITAMIN D (ERGOCALCIFEROL) 1.25 MG (50000 UNIT) PO CAPS: 50000.0000 [IU] | ORAL_CAPSULE | ORAL | 0 refills | Status: AC

## 2024-07-05 MED ORDER — ATORVASTATIN CALCIUM 80 MG PO TABS: 80.0000 mg | ORAL_TABLET | Freq: Every day | ORAL | 3 refills | Status: AC

## 2024-07-05 NOTE — Telephone Encounter (Signed)
 atorvastatin  (LIPITOR) 40 MG tablet [Pharmacy Med Name: ATORVASTATIN  40 MG TABLET]

## 2024-07-12 ENCOUNTER — Ambulatory Visit (HOSPITAL_COMMUNITY)

## 2024-07-13 ENCOUNTER — Other Ambulatory Visit

## 2024-07-16 ENCOUNTER — Ambulatory Visit (HOSPITAL_BASED_OUTPATIENT_CLINIC_OR_DEPARTMENT_OTHER): Admitting: Family

## 2024-07-19 ENCOUNTER — Encounter (HOSPITAL_BASED_OUTPATIENT_CLINIC_OR_DEPARTMENT_OTHER): Admitting: Family

## 2024-07-27 ENCOUNTER — Ambulatory Visit: Payer: Self-pay | Admitting: Nurse Practitioner
